# Patient Record
Sex: Male | Born: 1940 | Race: White | Hispanic: No | Marital: Married | Smoking: Former smoker
Health system: Southern US, Community
[De-identification: ages and names within clinical notes are randomized; demographics above are authoritative.]

## PROBLEM LIST (undated history)

## (undated) DIAGNOSIS — E079 Disorder of thyroid, unspecified: Secondary | ICD-10-CM

## (undated) DIAGNOSIS — R0600 Dyspnea, unspecified: Secondary | ICD-10-CM

## (undated) DIAGNOSIS — C801 Malignant (primary) neoplasm, unspecified: Secondary | ICD-10-CM

## (undated) DIAGNOSIS — D229 Melanocytic nevi, unspecified: Secondary | ICD-10-CM

## (undated) DIAGNOSIS — T7840XA Allergy, unspecified, initial encounter: Secondary | ICD-10-CM

## (undated) DIAGNOSIS — E785 Hyperlipidemia, unspecified: Secondary | ICD-10-CM

## (undated) DIAGNOSIS — K219 Gastro-esophageal reflux disease without esophagitis: Secondary | ICD-10-CM

## (undated) DIAGNOSIS — F32A Depression, unspecified: Secondary | ICD-10-CM

## (undated) DIAGNOSIS — J189 Pneumonia, unspecified organism: Secondary | ICD-10-CM

## (undated) DIAGNOSIS — M1990 Unspecified osteoarthritis, unspecified site: Secondary | ICD-10-CM

## (undated) DIAGNOSIS — I10 Essential (primary) hypertension: Secondary | ICD-10-CM

## (undated) DIAGNOSIS — K429 Umbilical hernia without obstruction or gangrene: Secondary | ICD-10-CM

## (undated) DIAGNOSIS — M722 Plantar fascial fibromatosis: Secondary | ICD-10-CM

## (undated) DIAGNOSIS — E039 Hypothyroidism, unspecified: Secondary | ICD-10-CM

## (undated) DIAGNOSIS — F329 Major depressive disorder, single episode, unspecified: Secondary | ICD-10-CM

## (undated) DIAGNOSIS — J449 Chronic obstructive pulmonary disease, unspecified: Secondary | ICD-10-CM

## (undated) HISTORY — DX: Umbilical hernia without obstruction or gangrene: K42.9

## (undated) HISTORY — PX: TONSILLECTOMY: SUR1361

## (undated) HISTORY — DX: Depression, unspecified: F32.A

## (undated) HISTORY — PX: JOINT REPLACEMENT: SHX530

## (undated) HISTORY — DX: Chronic obstructive pulmonary disease, unspecified: J44.9

## (undated) HISTORY — DX: Melanocytic nevi, unspecified: D22.9

## (undated) HISTORY — DX: Plantar fascial fibromatosis: M72.2

## (undated) HISTORY — PX: COLONOSCOPY W/ POLYPECTOMY: SHX1380

## (undated) HISTORY — DX: Allergy, unspecified, initial encounter: T78.40XA

## (undated) HISTORY — DX: Hyperlipidemia, unspecified: E78.5

## (undated) HISTORY — DX: Major depressive disorder, single episode, unspecified: F32.9

## (undated) HISTORY — DX: Essential (primary) hypertension: I10

## (undated) HISTORY — DX: Disorder of thyroid, unspecified: E07.9

## (undated) HISTORY — PX: COLONOSCOPY: SHX174

---

## 2002-11-12 ENCOUNTER — Encounter: Payer: Self-pay | Admitting: Family Medicine

## 2002-11-12 ENCOUNTER — Encounter: Admission: RE | Admit: 2002-11-12 | Discharge: 2002-11-12 | Payer: Self-pay | Admitting: Family Medicine

## 2005-11-25 ENCOUNTER — Ambulatory Visit: Payer: Self-pay | Admitting: Gastroenterology

## 2005-12-09 ENCOUNTER — Ambulatory Visit: Payer: Self-pay | Admitting: Gastroenterology

## 2006-01-07 ENCOUNTER — Ambulatory Visit: Payer: Self-pay

## 2006-09-18 ENCOUNTER — Emergency Department (HOSPITAL_COMMUNITY): Admission: EM | Admit: 2006-09-18 | Discharge: 2006-09-18 | Payer: Self-pay | Admitting: Emergency Medicine

## 2006-10-23 ENCOUNTER — Encounter: Admission: RE | Admit: 2006-10-23 | Discharge: 2006-10-23 | Payer: Self-pay | Admitting: Family Medicine

## 2006-11-09 ENCOUNTER — Emergency Department (HOSPITAL_COMMUNITY): Admission: EM | Admit: 2006-11-09 | Discharge: 2006-11-09 | Payer: Self-pay | Admitting: Emergency Medicine

## 2007-03-02 ENCOUNTER — Encounter: Payer: Self-pay | Admitting: Internal Medicine

## 2007-03-02 ENCOUNTER — Encounter: Admission: RE | Admit: 2007-03-02 | Discharge: 2007-03-02 | Payer: Self-pay | Admitting: Family Medicine

## 2007-04-24 DIAGNOSIS — I10 Essential (primary) hypertension: Secondary | ICD-10-CM | POA: Insufficient documentation

## 2007-04-24 DIAGNOSIS — E039 Hypothyroidism, unspecified: Secondary | ICD-10-CM | POA: Insufficient documentation

## 2007-04-24 DIAGNOSIS — F329 Major depressive disorder, single episode, unspecified: Secondary | ICD-10-CM | POA: Insufficient documentation

## 2007-04-27 ENCOUNTER — Ambulatory Visit: Payer: Self-pay | Admitting: Internal Medicine

## 2007-04-27 DIAGNOSIS — R911 Solitary pulmonary nodule: Secondary | ICD-10-CM | POA: Insufficient documentation

## 2007-04-27 DIAGNOSIS — J984 Other disorders of lung: Secondary | ICD-10-CM | POA: Insufficient documentation

## 2007-04-27 DIAGNOSIS — R0609 Other forms of dyspnea: Secondary | ICD-10-CM | POA: Insufficient documentation

## 2007-04-27 DIAGNOSIS — R0989 Other specified symptoms and signs involving the circulatory and respiratory systems: Secondary | ICD-10-CM

## 2007-06-03 ENCOUNTER — Ambulatory Visit: Payer: Self-pay | Admitting: Internal Medicine

## 2007-06-03 DIAGNOSIS — J449 Chronic obstructive pulmonary disease, unspecified: Secondary | ICD-10-CM | POA: Insufficient documentation

## 2007-07-07 ENCOUNTER — Ambulatory Visit: Payer: Self-pay | Admitting: Internal Medicine

## 2007-08-11 ENCOUNTER — Ambulatory Visit: Payer: Self-pay | Admitting: Internal Medicine

## 2007-08-13 ENCOUNTER — Encounter: Payer: Self-pay | Admitting: Internal Medicine

## 2007-08-19 ENCOUNTER — Telehealth (INDEPENDENT_AMBULATORY_CARE_PROVIDER_SITE_OTHER): Payer: Self-pay | Admitting: *Deleted

## 2007-09-18 ENCOUNTER — Telehealth (INDEPENDENT_AMBULATORY_CARE_PROVIDER_SITE_OTHER): Payer: Self-pay | Admitting: *Deleted

## 2007-10-02 ENCOUNTER — Ambulatory Visit: Payer: Self-pay | Admitting: Internal Medicine

## 2008-01-11 ENCOUNTER — Ambulatory Visit: Payer: Self-pay | Admitting: Internal Medicine

## 2009-08-17 ENCOUNTER — Encounter: Admission: RE | Admit: 2009-08-17 | Discharge: 2009-08-17 | Payer: Self-pay | Admitting: Internal Medicine

## 2010-11-02 LAB — DIFFERENTIAL
Basophils Absolute: 0.1
Basophils Relative: 1
Eosinophils Absolute: 0
Monocytes Relative: 12 — ABNORMAL HIGH
Neutro Abs: 9.9 — ABNORMAL HIGH
Neutrophils Relative %: 80 — ABNORMAL HIGH

## 2010-11-02 LAB — APTT: aPTT: 31

## 2010-11-02 LAB — CBC
MCHC: 33.4
MCV: 87.4
Platelets: 208
RBC: 3.86 — ABNORMAL LOW
RDW: 12.6

## 2010-11-02 LAB — I-STAT 8, (EC8 V) (CONVERTED LAB)
Acid-base deficit: 4 — ABNORMAL HIGH
Bicarbonate: 20.9
TCO2: 22
pCO2, Ven: 35.2 — ABNORMAL LOW
pH, Ven: 7.381 — ABNORMAL HIGH

## 2010-11-02 LAB — PROTIME-INR: INR: 1.2

## 2010-11-02 LAB — POCT I-STAT CREATININE
Creatinine, Ser: 1.1
Operator id: 279831

## 2010-11-02 LAB — SAMPLE TO BLOOD BANK

## 2010-11-12 ENCOUNTER — Encounter (INDEPENDENT_AMBULATORY_CARE_PROVIDER_SITE_OTHER): Payer: Self-pay | Admitting: General Surgery

## 2010-11-14 ENCOUNTER — Encounter (INDEPENDENT_AMBULATORY_CARE_PROVIDER_SITE_OTHER): Payer: Self-pay | Admitting: General Surgery

## 2010-11-14 ENCOUNTER — Ambulatory Visit (INDEPENDENT_AMBULATORY_CARE_PROVIDER_SITE_OTHER): Payer: Medicare Other | Admitting: General Surgery

## 2010-11-14 VITALS — BP 142/90 | HR 68 | Temp 96.4°F | Resp 20 | Ht 69.0 in | Wt 194.1 lb

## 2010-11-14 DIAGNOSIS — K429 Umbilical hernia without obstruction or gangrene: Secondary | ICD-10-CM

## 2010-11-14 DIAGNOSIS — K4090 Unilateral inguinal hernia, without obstruction or gangrene, not specified as recurrent: Secondary | ICD-10-CM

## 2010-11-14 NOTE — Patient Instructions (Signed)
No lifting for six weeks after surgery

## 2010-11-14 NOTE — Progress Notes (Signed)
Subjective:     Patient ID: Brandon Sanford, male   DOB: 06/09/1940, 70 y.o.   MRN: [id number]  HPI The patient presents complaining of umbilical hernia and left inguinal hernia. He does left inguinal hernia sometime ago.  It is mildly uncomfortable. It is getting slightly larger recently. His umbilical hernia has been stable in size. It is somewhat uncomfortable as well. Review of Systems  Constitutional: Negative for fever, chills and unexpected weight change.  HENT: Negative for hearing loss, congestion, sore throat, trouble swallowing and voice change.   Eyes: Negative for visual disturbance.  Respiratory: Negative for cough and wheezing.   Cardiovascular: Negative for chest pain, palpitations and leg swelling.  Gastrointestinal: Negative for nausea, vomiting, abdominal pain, diarrhea, constipation, blood in stool, abdominal distention, anal bleeding and rectal pain.  Genitourinary: Negative for hematuria and difficulty urinating.  Musculoskeletal: Negative for arthralgias.  Skin: Negative for rash and wound.  Neurological: Negative for seizures, syncope, weakness and headaches.  Hematological: Negative for adenopathy. Does not bruise/bleed easily.  Psychiatric/Behavioral: Negative for confusion.  Umbilical hernia and left inguinal hernia as above     Objective:   Physical Exam  Constitutional: He is oriented to person, place, and time. He appears well-developed and well-nourished. No distress.  HENT:  Head: Normocephalic and atraumatic.  Eyes: Conjunctivae and EOM are normal. Pupils are equal, round, and reactive to light. Right eye exhibits no discharge.  Neck: Normal range of motion. Neck supple. No tracheal deviation present. No thyromegaly present.  Cardiovascular: Normal rate, regular rhythm and normal heart sounds.   No murmur heard. Pulmonary/Chest: Effort normal and breath sounds normal. No respiratory distress. He has no wheezes. He has no rales.  Abdominal: Soft. Bowel  sounds are normal. He exhibits no distension. There is no tenderness. There is no rebound and no guarding.  Genitourinary: Penis normal. No penile tenderness.  Neurological: He is alert and oriented to person, place, and time.  Skin: Skin is warm and dry.  Abdominal exam: Additionally a small umbilical hernia is noted. This is easily reducible. There is no discomfort on reduction. There is no overlying skin change. Inguinal exam reveals both testes are descended. There is asymmetric fat distribution with the right side larger than the left in his suprapubic region. There is no right inguinal hernia. He does have an easily reducible left inguinal hernia. It does not extend into the scrotum. It is not significantly uncomfortable on reduction.     Assessment:     Umbilical hernia and left inguinal hernia    Plan:     I have offered repair of umbilical hernia with mesh and repair of left inguinal hernia with mesh as an outpatient procedure. Procedure, risks, and benefits, were discussed with the patient and he is agreeable. I advised him he'll need to avoid heavy lifting for a total of 6 weeks after surgery. 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 He was explored to scheduling in the near future so this can be done over the winter season. The questions were answered.

## 2010-11-20 ENCOUNTER — Encounter (HOSPITAL_BASED_OUTPATIENT_CLINIC_OR_DEPARTMENT_OTHER): Payer: Self-pay | Admitting: *Deleted

## 2010-11-23 ENCOUNTER — Ambulatory Visit
Admission: RE | Admit: 2010-11-23 | Discharge: 2010-11-23 | Disposition: A | Discharge status: Home / Self Care | Payer: Medicare Other | Source: Ambulatory Visit | Attending: General Surgery | Admitting: General Surgery

## 2010-11-23 ENCOUNTER — Inpatient Hospital Stay (HOSPITAL_BASED_OUTPATIENT_CLINIC_OR_DEPARTMENT_OTHER)
Admission: RE | Admit: 2010-11-23 | Discharge: 2010-11-23 | Disposition: A | Discharge status: Home / Self Care | Payer: Medicare Other | Source: Ambulatory Visit

## 2010-11-23 ENCOUNTER — Other Ambulatory Visit (INDEPENDENT_AMBULATORY_CARE_PROVIDER_SITE_OTHER): Payer: Self-pay | Admitting: General Surgery

## 2010-11-23 ENCOUNTER — Other Ambulatory Visit: Payer: Self-pay

## 2010-11-23 DIAGNOSIS — Z01811 Encounter for preprocedural respiratory examination: Secondary | ICD-10-CM

## 2010-11-23 LAB — BASIC METABOLIC PANEL
CO2: 28 mEq/L (ref 19–32)
Calcium: 10.2 mg/dL (ref 8.4–10.5)
Creatinine, Ser: 1.01 mg/dL (ref 0.50–1.35)
Glucose, Bld: 100 mg/dL — ABNORMAL HIGH (ref 70–99)
Sodium: 142 mEq/L (ref 135–145)

## 2010-11-26 ENCOUNTER — Ambulatory Visit (HOSPITAL_BASED_OUTPATIENT_CLINIC_OR_DEPARTMENT_OTHER)
Admission: RE | Admit: 2010-11-26 | Discharge: 2010-11-26 | Disposition: A | Discharge status: Home / Self Care | Payer: Medicare Other | Source: Ambulatory Visit | Attending: General Surgery | Admitting: General Surgery

## 2010-11-26 ENCOUNTER — Encounter (HOSPITAL_BASED_OUTPATIENT_CLINIC_OR_DEPARTMENT_OTHER): Payer: Self-pay | Admitting: Anesthesiology

## 2010-11-26 ENCOUNTER — Encounter (HOSPITAL_BASED_OUTPATIENT_CLINIC_OR_DEPARTMENT_OTHER): Payer: Self-pay | Admitting: General Surgery

## 2010-11-26 ENCOUNTER — Encounter (HOSPITAL_BASED_OUTPATIENT_CLINIC_OR_DEPARTMENT_OTHER)
Admission: RE | Disposition: A | Discharge status: Home / Self Care | Payer: Self-pay | Source: Ambulatory Visit | Attending: General Surgery

## 2010-11-26 ENCOUNTER — Ambulatory Visit (HOSPITAL_BASED_OUTPATIENT_CLINIC_OR_DEPARTMENT_OTHER): Payer: Medicare Other | Admitting: Anesthesiology

## 2010-11-26 DIAGNOSIS — K429 Umbilical hernia without obstruction or gangrene: Secondary | ICD-10-CM | POA: Insufficient documentation

## 2010-11-26 DIAGNOSIS — K4090 Unilateral inguinal hernia, without obstruction or gangrene, not specified as recurrent: Secondary | ICD-10-CM | POA: Insufficient documentation

## 2010-11-26 DIAGNOSIS — J449 Chronic obstructive pulmonary disease, unspecified: Secondary | ICD-10-CM | POA: Insufficient documentation

## 2010-11-26 DIAGNOSIS — J4489 Other specified chronic obstructive pulmonary disease: Secondary | ICD-10-CM | POA: Insufficient documentation

## 2010-11-26 DIAGNOSIS — I10 Essential (primary) hypertension: Secondary | ICD-10-CM | POA: Insufficient documentation

## 2010-11-26 DIAGNOSIS — Z01810 Encounter for preprocedural cardiovascular examination: Secondary | ICD-10-CM | POA: Insufficient documentation

## 2010-11-26 HISTORY — PX: UMBILICAL HERNIA REPAIR: SHX196

## 2010-11-26 HISTORY — PX: INGUINAL HERNIA REPAIR: SHX194

## 2010-11-26 HISTORY — PX: HERNIA REPAIR: SHX51

## 2010-11-26 SURGERY — REPAIR, HERNIA, UMBILICAL, ADULT
Anesthesia: General | Site: Groin | Wound class: Clean

## 2010-11-26 MED ORDER — OXYMETAZOLINE HCL 0.05 % NA SOLN: 2.0000 | Freq: Once | NASAL | Status: DC

## 2010-11-26 MED ORDER — ATROPINE SULFATE 0.4 MG/ML IJ SOLN: 0.4000 mg | Freq: Once | INTRAMUSCULAR | Status: DC | PRN

## 2010-11-26 MED ORDER — OXYCODONE-ACETAMINOPHEN 5-325 MG PO TABS: 2.0000 | ORAL_TABLET | Freq: Four times a day (QID) | ORAL | Status: AC | PRN

## 2010-11-26 MED ORDER — ACETAMINOPHEN 10 MG/ML IV SOLN
1000.0000 mg | Freq: Once | INTRAVENOUS | Status: AC
  Administered 2010-11-26: 1000 mg via INTRAVENOUS

## 2010-11-26 MED ORDER — LACTATED RINGERS IV SOLN: 500.0000 mL | INTRAVENOUS | Status: DC

## 2010-11-26 MED ORDER — MIDAZOLAM HCL 2 MG/2ML IJ SOLN: 1.0000 mg | INTRAMUSCULAR | Status: DC | PRN

## 2010-11-26 MED ORDER — CEFAZOLIN SODIUM 1-5 GM-% IV SOLN: 1.0000 g | INTRAVENOUS | Status: DC

## 2010-11-26 MED ORDER — KETOROLAC TROMETHAMINE 30 MG/ML IJ SOLN: 15.0000 mg | Freq: Once | INTRAMUSCULAR | Status: DC | PRN

## 2010-11-26 MED ORDER — FENTANYL CITRATE 0.05 MG/ML IJ SOLN
INTRAMUSCULAR | Status: DC | PRN
  Administered 2010-11-26: 25 ug via INTRAVENOUS
  Administered 2010-11-26 (×2): 50 ug via INTRAVENOUS

## 2010-11-26 MED ORDER — LIDOCAINE-PRILOCAINE 2.5-2.5 % EX CREA: 1.0000 "application " | TOPICAL_CREAM | Freq: Once | CUTANEOUS | Status: DC

## 2010-11-26 MED ORDER — GLYCOPYRROLATE 0.2 MG/ML IJ SOLN: 0.2000 mg | Freq: Once | INTRAMUSCULAR | Status: DC | PRN

## 2010-11-26 MED ORDER — DROPERIDOL 2.5 MG/ML IJ SOLN
INTRAMUSCULAR | Status: DC | PRN
  Administered 2010-11-26: 0.625 mg via INTRAVENOUS

## 2010-11-26 MED ORDER — IBUPROFEN 200 MG PO TABS: 200.0000 mg | ORAL_TABLET | Freq: Four times a day (QID) | ORAL | Status: DC | PRN

## 2010-11-26 MED ORDER — LACTATED RINGERS IV SOLN: INTRAVENOUS | Status: DC

## 2010-11-26 MED ORDER — LABETALOL HCL 5 MG/ML IV SOLN: 5.0000 mg | INTRAVENOUS | Status: DC | PRN

## 2010-11-26 MED ORDER — MIDAZOLAM HCL 2 MG/2ML IJ SOLN: 0.5000 mg | INTRAMUSCULAR | Status: DC | PRN

## 2010-11-26 MED ORDER — LACTATED RINGERS IV SOLN
INTRAVENOUS | Status: DC
  Administered 2010-11-26: 07:00:00 via INTRAVENOUS

## 2010-11-26 MED ORDER — MORPHINE SULFATE 2 MG/ML IJ SOLN: 0.0500 mg/kg | INTRAMUSCULAR | Status: DC | PRN

## 2010-11-26 MED ORDER — DEXAMETHASONE SODIUM PHOSPHATE 4 MG/ML IJ SOLN
INTRAMUSCULAR | Status: DC | PRN
  Administered 2010-11-26: 10 mg via INTRAVENOUS

## 2010-11-26 MED ORDER — PROPOFOL 10 MG/ML IV EMUL
INTRAVENOUS | Status: DC | PRN
  Administered 2010-11-26: 250 mg via INTRAVENOUS

## 2010-11-26 MED ORDER — ONDANSETRON HCL 4 MG/2ML IJ SOLN
INTRAMUSCULAR | Status: DC | PRN
  Administered 2010-11-26: 4 mg via INTRAVENOUS

## 2010-11-26 MED ORDER — BUPIVACAINE-EPINEPHRINE 0.5% -1:200000 IJ SOLN
INTRAMUSCULAR | Status: DC | PRN
  Administered 2010-11-26: 30 mL

## 2010-11-26 MED ORDER — METOCLOPRAMIDE HCL 5 MG/ML IJ SOLN: 10.0000 mg | Freq: Once | INTRAMUSCULAR | Status: DC | PRN

## 2010-11-26 MED ORDER — EPHEDRINE SULFATE 50 MG/ML IJ SOLN
INTRAMUSCULAR | Status: DC | PRN
  Administered 2010-11-26 (×3): 10 mg via INTRAVENOUS

## 2010-11-26 MED ORDER — FENTANYL CITRATE 0.05 MG/ML IJ SOLN: 25.0000 ug | INTRAMUSCULAR | Status: DC | PRN

## 2010-11-26 MED ORDER — LACTATED RINGERS IV SOLN
INTRAVENOUS | Status: DC | PRN
  Administered 2010-11-26 (×3): via INTRAVENOUS

## 2010-11-26 MED ORDER — MIDAZOLAM HCL 5 MG/5ML IJ SOLN
INTRAMUSCULAR | Status: DC | PRN
  Administered 2010-11-26: 2 mg via INTRAVENOUS

## 2010-11-26 MED ORDER — CEFAZOLIN SODIUM 1-5 GM-% IV SOLN
INTRAVENOUS | Status: DC | PRN
  Administered 2010-11-26: 2 g via INTRAVENOUS

## 2010-11-26 SURGICAL SUPPLY — 49 items
APPLICATOR COTTON TIP 6IN STRL (MISCELLANEOUS) IMPLANT
BLADE HEX COATED 2.75 (ELECTRODE) ×3 IMPLANT
BLADE SURG 10 STRL SS (BLADE) ×3 IMPLANT
BLADE SURG 15 STRL LF DISP TIS (BLADE) ×2 IMPLANT
BLADE SURG 15 STRL SS (BLADE) ×1
BLADE SURG ROTATE 9660 (MISCELLANEOUS) ×3 IMPLANT
CANISTER SUCTION 1200CC (MISCELLANEOUS) ×3 IMPLANT
CHLORAPREP W/TINT 26ML (MISCELLANEOUS) ×3 IMPLANT
CLOTH BEACON ORANGE TIMEOUT ST (SAFETY) ×3 IMPLANT
COVER MAYO STAND STRL (DRAPES) ×3 IMPLANT
COVER TABLE BACK 60X90 (DRAPES) ×3 IMPLANT
DECANTER SPIKE VIAL GLASS SM (MISCELLANEOUS) ×3 IMPLANT
DERMABOND ADVANCED (GAUZE/BANDAGES/DRESSINGS) ×2
DERMABOND ADVANCED .7 DNX12 (GAUZE/BANDAGES/DRESSINGS) ×4 IMPLANT
DRAIN PENROSE 1/2X12 LTX STRL (WOUND CARE) ×3 IMPLANT
DRAPE PED LAPAROTOMY (DRAPES) ×3 IMPLANT
DRAPE UTILITY XL STRL (DRAPES) ×3 IMPLANT
ELECT REM PT RETURN 9FT ADLT (ELECTROSURGICAL) ×3
ELECTRODE REM PT RTRN 9FT ADLT (ELECTROSURGICAL) ×2 IMPLANT
GLOVE BIO SURGEON STRL SZ8 (GLOVE) ×3 IMPLANT
GLOVE BIOGEL PI IND STRL 8 (GLOVE) ×2 IMPLANT
GLOVE BIOGEL PI INDICATOR 8 (GLOVE) ×1
GLOVE ECLIPSE 6.5 STRL STRAW (GLOVE) ×3 IMPLANT
GOWN PREVENTION PLUS XLARGE (GOWN DISPOSABLE) ×3 IMPLANT
GOWN PREVENTION PLUS XXLARGE (GOWN DISPOSABLE) ×3 IMPLANT
MESH HERNIA 3X6 (Mesh General) ×3 IMPLANT
NEEDLE HYPO 25X1 1.5 SAFETY (NEEDLE) ×3 IMPLANT
NS IRRIG 1000ML POUR BTL (IV SOLUTION) ×3 IMPLANT
PACK BASIN DAY SURGERY FS (CUSTOM PROCEDURE TRAY) ×3 IMPLANT
PATCH VENTRAL SMALL 4.3 (Mesh Specialty) ×3 IMPLANT
PENCIL BUTTON HOLSTER BLD 10FT (ELECTRODE) ×3 IMPLANT
SLEEVE SCD COMPRESS KNEE MED (MISCELLANEOUS) IMPLANT
SPONGE LAP 18X18 X RAY DECT (DISPOSABLE) IMPLANT
SPONGE LAP 4X18 X RAY DECT (DISPOSABLE) ×3 IMPLANT
SUT MON AB 4-0 PC3 18 (SUTURE) ×6 IMPLANT
SUT PROLENE 0 CT 2 (SUTURE) ×18 IMPLANT
SUT VIC AB 2-0 SH 27 (SUTURE) ×3
SUT VIC AB 2-0 SH 27XBRD (SUTURE) ×6 IMPLANT
SUT VIC AB 3-0 SH 27 (SUTURE) ×1
SUT VIC AB 3-0 SH 27X BRD (SUTURE) ×2 IMPLANT
SUT VICRYL AB 2 0 TIE (SUTURE) IMPLANT
SUT VICRYL AB 2 0 TIES (SUTURE)
SYR CONTROL 10ML LL (SYRINGE) ×3 IMPLANT
TAPE STRIPS DRAPE STRL (GAUZE/BANDAGES/DRESSINGS) ×3 IMPLANT
TOWEL OR 17X24 6PK STRL BLUE (TOWEL DISPOSABLE) ×6 IMPLANT
TOWEL OR NON WOVEN STRL DISP B (DISPOSABLE) ×3 IMPLANT
TUBE CONNECTING 20X1/4 (TUBING) ×3 IMPLANT
WATER STERILE IRR 1000ML POUR (IV SOLUTION) ×3 IMPLANT
YANKAUER SUCT BULB TIP NO VENT (SUCTIONS) ×3 IMPLANT

## 2010-11-26 NOTE — Transfer of Care (Signed)
Immediate Anesthesia Transfer of Care Note  Patient: Brandon Sanford  Procedure(s) Performed:  HERNIA REPAIR UMBILICAL ADULT - umbilical hernia repair with mesh; HERNIA REPAIR INGUINAL ADULT - left inguinal hernia repair with mesh  Patient Location: PACU  Anesthesia Type: General  Level of Consciousness: awake and sedated  Airway & Oxygen Therapy: Patient Spontanous Breathing and Patient connected to face mask oxygen  Post-op Assessment: Report given to PACU RN and Post -op Vital signs reviewed and stable  Post vital signs: Reviewed and stable  Complications: No apparent anesthesia complications

## 2010-11-26 NOTE — Anesthesia Preprocedure Evaluation (Addendum)
Anesthesia Evaluation  Patient identified by MRN, date of birth, ID band Patient awake    Reviewed: Allergy & Precautions, H&P , NPO status , Patient's Chart, lab work & pertinent test results, reviewed documented beta blocker date and time   Airway Mallampati: II TM Distance: >3 FB Neck ROM: full    Dental No notable dental hx.    Pulmonary asthma , COPD COPD inhaler,    Pulmonary exam normal       Cardiovascular hypertension, On Medications     Neuro/Psych PSYCHIATRIC DISORDERS Negative Neurological ROS     GI/Hepatic negative GI ROS, Neg liver ROS,   Endo/Other  Negative Endocrine ROS  Renal/GU negative Renal ROS  Genitourinary negative   Musculoskeletal   Abdominal   Peds  Hematology negative hematology ROS (+)   Anesthesia Other Findings   Reproductive/Obstetrics negative OB ROS                           Anesthesia Physical Anesthesia Plan  ASA: III  Anesthesia Plan: General   Post-op Pain Management:    Induction: Intravenous  Airway Management Planned: LMA  Additional Equipment:   Intra-op Plan:   Post-operative Plan: Extubation in OR  Informed Consent: I have reviewed the patients History and Physical, chart, labs and discussed the procedure including the risks, benefits and alternatives for the proposed anesthesia with the patient or authorized representative who has indicated his/her understanding and acceptance.     Plan Discussed with: CRNA and Surgeon  Anesthesia Plan Comments:        Anesthesia Quick Evaluation

## 2010-11-26 NOTE — Anesthesia Procedure Notes (Signed)
Procedure Name: LMA Insertion Date/Time: 11/26/2010 7:31 AM Performed by: Zenia Resides D Patient Re-evaluated:Patient Re-evaluated prior to inductionOxygen Delivery Method: Circle System Utilized Preoxygenation: Pre-oxygenation with 100% oxygen Intubation Type: IV induction Ventilation: Mask ventilation without difficulty LMA: LMA with gastric port inserted LMA Size: 5.0 Placement Confirmation: positive ETCO2,  CO2 detector and breath sounds checked- equal and bilateral Tube secured with: Tape Dental Injury: Teeth and Oropharynx as per pre-operative assessment

## 2010-11-26 NOTE — H&P (Signed)
Patient re-examined.  See H&P.  No significant changes since that exam.

## 2010-11-26 NOTE — Brief Op Note (Signed)
11/26/2010  9:09 AM  PATIENT:  Brandon Sanford  70 y.o. male  PRE-OPERATIVE DIAGNOSIS:  umbilical and  left inguinal hernia   POST-OPERATIVE DIAGNOSIS:  umbilical and left inguinal hernia  PROCEDURE:  Procedure(s): HERNIA REPAIR UMBILICAL ADULT HERNIA REPAIR INGUINAL ADULT  SURGEON:  Surgeon(s): Liz Malady, MDnone  PHYSICIAN ASSISTANT:   ASSISTANTS:    ANESTHESIA:   general  EBL:  Total I/O In: 2100 [I.V.:2100] Out: -   BLOOD ADMINISTERED:none  DRAINS: none   LOCAL MEDICATIONS USED:  MARCAINE 30CC  SPECIMEN:  No Specimen  DISPOSITION OF SPECIMEN:  N/A  COUNTS:  YES  TOURNIQUET:  * No tourniquets in log *  DICTATION: .Dragon Dictation  PLAN OF CARE: Discharge to home after PACU  PATIENT DISPOSITION:  PACU - hemodynamically stable.   Delay start of Pharmacological VTE agent (>24hrs) due to surgical blood loss or risk of bleeding:  no   Patient was identified in the preop holding area. Consent was obtained.  he received intravenous antibiotics. Brought to the operating room and general anesthesia with laryngeal mask airway was administered by the anesthesia staff. His abdomen was prepped and draped in sterile fashion. Attention was first directed to the umbilical hernia. Time out procedure. The area below the umbilicus was infiltrated with half percent Marcaine with epinephrine. An umbilical incision was made. 16 his tissues were dissected down revealing the anterior fascia. The umbilicus was circumferentially dissected. The umbilical skin was carefully dissected away from the under umbilical tissue. There was no damage to the umbilical skin. The hernia sac was entered. It contained only omentum. This was circumferentially dissected away from the fascia. The sac was dissected off of the omentum in the fascia and discarded. The omentum was reduced completely back into the abdomen. The fascia was cleared off circumferentially. Next a 4.3 cm proceed hernia patch was  used to complete the repair. The superior flap was tacked to the fascia with interrupted 0 Prolene sutures. The inferior flap was sutured to the fascia with interrupted 0 Prolene sutures as well. Additional interrupted 0 point sutures were placed medially and laterally. This completed attachment of the mesh to the fascia. The area was copiously irrigated. The underneath of the umbilical skin was tacked onto the underlying fascia with interrupted 2-0 Vicryl sutures. Subcutaneous tissues were closed with interrupted 3-0 Vicryl sutures. The skin was closed with running 4-0 Monocryl subcuticular stitch and Dermabond. All counts were correct.  Next attention was directed to the left inguinal region. Percent Marcaine with epinephrine was injected towards the anterior superior iliac spine and along the planned line of incision. A single incision was made. Subcutaneous tissues were dissected down to Scarpa's fascia. Excellent hemostasis was obtained with Bovie cautery. The external oblique fascia was exposed this was divided out laterally in the division was continued down through the external ring. The superior leaflet of the external oblique fascia was dissected free off the transversalis. The inferior leaflet was dissected down revealing the shelving edge of inguinal ligament. Cord structures were encircled with a Penrose drain. Inspection of the floor revealed an intact floor. The cord was then dissected. This revealed a moderately large indirect hernia sac. This contained omentum. It was completely dissected free from the cord structures. It had quite a tight neck so the omentum it contained was divided and the sac was high ligated with 2-0 Vicryl suture. The sac and small piece of omentum were discarded. Cord was further inspected and no other abnormalities were noted. The  hernia repair was completed with a keyhole polypropylene mesh cut to custom size. This was tacked to the tissues over the pubic tubercle medially  with 0 Prolene sutures. It was sutured in a continuous fashion to the shelving edge of the inguinal ligament inferiorly with 0 Prolene sutures. The superior portion of the mesh was sutured to the tissues again over the pubic tubercle with interrupted 0 Prolene sutures. It was then tacked down along the transversalis with interrupted 0 Prolene sutures. The 2 leaflets of the mesh were rejoined behind the cord structures and reflected. They were tacked together and down to the underlying musculature. This was using 0 Prolene as well. The area was grossly irrigated. Some additional local anesthetic was injected. The external oblique fascia was then closed with suturing with 2-0 Vicryl sutures. Subcutaneous tissues were irrigated. Scarpa's fascia was approximated with inverted 3-0 Vicryl sutures. The skin was closed with running 4-0 Monocryl followed by Dermabond. Sponge needle and instrument counts were again correct. Patient's left testicle was relocated to the anatomic position in the scrotum at the completion of the procedure. There were no apparent palpitations. The patient was taken recovery in stable condition.               Annison Birchard E 11/5/20129:09 AM

## 2010-11-26 NOTE — Anesthesia Postprocedure Evaluation (Signed)
  Anesthesia Post-op Note  Patient: Brandon Sanford  Procedure(s) Performed:  HERNIA REPAIR UMBILICAL ADULT - umbilical hernia repair with mesh; HERNIA REPAIR INGUINAL ADULT - left inguinal hernia repair with mesh  Patient Location: PACU  Anesthesia Type: General  Level of Consciousness: awake  Airway and Oxygen Therapy: Patient Spontanous Breathing  Post-op Pain: mild  Post-op Assessment: Post-op Vital signs reviewed, Patient's Cardiovascular Status Stable, Respiratory Function Stable, Patent Airway, No signs of Nausea or vomiting, Adequate PO intake and Pain level controlled  Post-op Vital Signs: Reviewed and stable  Complications: No apparent anesthesia complications

## 2010-12-06 ENCOUNTER — Encounter (INDEPENDENT_AMBULATORY_CARE_PROVIDER_SITE_OTHER): Payer: Self-pay

## 2010-12-11 ENCOUNTER — Encounter (HOSPITAL_BASED_OUTPATIENT_CLINIC_OR_DEPARTMENT_OTHER): Payer: Self-pay | Admitting: General Surgery

## 2010-12-19 ENCOUNTER — Ambulatory Visit (INDEPENDENT_AMBULATORY_CARE_PROVIDER_SITE_OTHER): Payer: Medicare Other | Admitting: General Surgery

## 2010-12-19 ENCOUNTER — Encounter (INDEPENDENT_AMBULATORY_CARE_PROVIDER_SITE_OTHER): Payer: Self-pay | Admitting: General Surgery

## 2010-12-19 VITALS — BP 132/90 | HR 60 | Temp 96.7°F | Resp 16 | Ht 69.0 in | Wt 194.1 lb

## 2010-12-19 DIAGNOSIS — Z8719 Personal history of other diseases of the digestive system: Secondary | ICD-10-CM

## 2010-12-19 DIAGNOSIS — Z9889 Other specified postprocedural states: Secondary | ICD-10-CM

## 2010-12-19 DIAGNOSIS — Z09 Encounter for follow-up examination after completed treatment for conditions other than malignant neoplasm: Secondary | ICD-10-CM

## 2010-12-19 NOTE — Progress Notes (Signed)
Subjective:     Patient ID: Brandon Sanford, male   DOB: 12-14-1940, 70 y.o.   MRN: [id number]  HPI Patient status post umbilical hernia repair with mesh and left inguinal hernia repair with mesh on November 14, 2010. Postoperatively he is doing very well. He is no longer taking any pain medication. He did have some constipation but that initially. He is having some mild tenderness in his left testicle.  Review of Systems     Objective:   Physical Exam Umbilical incision is well healed. His hernia repairs intact. There is no evidence of infection. Left inguinal incision is healing well. There's no signs of infection. His hernia appears intact. He is very mild edema of his left testicle. There is no significant ecchymosis.    Assessment:     Very well after her umbilical hernia and left inguinal hernia repair both with mesh    Plan:     Avoid heavy lifting for a total of 6 weeks after surgery. I feel his testicular congestion will resolve over the next month. I will see him back as needed.

## 2010-12-19 NOTE — Patient Instructions (Signed)
No lifting over 10lb for total of 6 weeks after surgery

## 2012-02-12 ENCOUNTER — Encounter (INDEPENDENT_AMBULATORY_CARE_PROVIDER_SITE_OTHER): Payer: Self-pay | Admitting: General Surgery

## 2012-02-12 ENCOUNTER — Ambulatory Visit (INDEPENDENT_AMBULATORY_CARE_PROVIDER_SITE_OTHER): Payer: Medicare Other | Admitting: General Surgery

## 2012-02-12 VITALS — BP 136/84 | HR 72 | Temp 97.8°F | Resp 16 | Ht 69.0 in | Wt 194.0 lb

## 2012-02-12 DIAGNOSIS — R1033 Periumbilical pain: Secondary | ICD-10-CM

## 2012-02-12 NOTE — Patient Instructions (Addendum)
Call if any side effects from medication as we discussed

## 2012-02-12 NOTE — Progress Notes (Signed)
Subjective:     Patient ID: Brandon Sanford, male   DOB: November 14, 1940, 72 y.o.   MRN: [id number]  HPI Patient status post umbilical hernia repair and left inguinal hernia repair with mesh in 2012. For the past several months, he has had some pain around his umbilical site. He's had no skin changes. He has had no bulging. The pain is sharp at times and dull at others. He has had no change in his dietary habits. No inguinal discomfort.  Review of Systems     Objective:   Physical Exam  Constitutional: He is oriented to person, place, and time. He appears well-developed and well-nourished.  Cardiovascular: Normal rate and regular rhythm.   Pulmonary/Chest: No respiratory distress.  Abdominal: Soft. He exhibits no distension and no mass. There is no tenderness.       Umbilical incision remains well-healed. No evidence of recurrence of hernia. No significant point tenderness, left inguinal incision well healed with no evidence of recurrence. Both testes are descended. No right inguinal hernia is felt.  Neurological: He is alert and oriented to person, place, and time.  Skin: Skin is warm and dry.       Assessment:     Umbilical pain with history of umbilical hernia repair with mesh, pain likely due to nerve irritation from scar tissue    Plan:     Lyrica 75 mg twice a day. I will see him back in 6 weeks. I discussed the possible side effects and to let us know or discontinue if any of these arise.

## 2012-12-11 ENCOUNTER — Emergency Department (HOSPITAL_COMMUNITY)
Admission: EM | Admit: 2012-12-11 | Discharge: 2012-12-11 | Discharge status: Absent without leave | Payer: Medicare Other | Source: Home / Self Care

## 2013-02-08 ENCOUNTER — Ambulatory Visit: Payer: Medicare HMO

## 2013-10-01 ENCOUNTER — Telehealth: Payer: Self-pay | Admitting: Internal Medicine

## 2013-10-01 NOTE — Telephone Encounter (Signed)
Spoke with the pt  He is requesting cxr discs for his PCP  I advised to contact our medical records dept for this  He verbalized understanding  Nothing further needed

## 2013-11-29 ENCOUNTER — Inpatient Hospital Stay
Admission: RE | Admit: 2013-11-29 | Discharge: 2013-11-29 | Disposition: A | Discharge status: Home / Self Care | Payer: Self-pay | Source: Ambulatory Visit | Attending: Family Medicine | Admitting: Family Medicine

## 2013-11-29 ENCOUNTER — Other Ambulatory Visit (HOSPITAL_COMMUNITY): Payer: Self-pay | Admitting: Diagnostic Radiology

## 2013-11-29 ENCOUNTER — Other Ambulatory Visit: Payer: Self-pay | Admitting: Family Medicine

## 2013-11-29 ENCOUNTER — Ambulatory Visit
Admission: RE | Admit: 2013-11-29 | Discharge: 2013-11-29 | Disposition: A | Discharge status: Home / Self Care | Payer: Medicare Other | Source: Ambulatory Visit | Attending: Family Medicine | Admitting: Family Medicine

## 2013-11-29 DIAGNOSIS — R911 Solitary pulmonary nodule: Secondary | ICD-10-CM

## 2014-02-01 DIAGNOSIS — J069 Acute upper respiratory infection, unspecified: Secondary | ICD-10-CM | POA: Diagnosis not present

## 2014-05-02 DIAGNOSIS — E039 Hypothyroidism, unspecified: Secondary | ICD-10-CM | POA: Diagnosis not present

## 2014-05-02 DIAGNOSIS — J449 Chronic obstructive pulmonary disease, unspecified: Secondary | ICD-10-CM | POA: Diagnosis not present

## 2014-05-02 DIAGNOSIS — I10 Essential (primary) hypertension: Secondary | ICD-10-CM | POA: Diagnosis not present

## 2014-05-02 DIAGNOSIS — E780 Pure hypercholesterolemia: Secondary | ICD-10-CM | POA: Diagnosis not present

## 2014-11-02 DIAGNOSIS — I10 Essential (primary) hypertension: Secondary | ICD-10-CM | POA: Diagnosis not present

## 2014-11-02 DIAGNOSIS — J449 Chronic obstructive pulmonary disease, unspecified: Secondary | ICD-10-CM | POA: Diagnosis not present

## 2014-11-02 DIAGNOSIS — E039 Hypothyroidism, unspecified: Secondary | ICD-10-CM | POA: Diagnosis not present

## 2014-11-02 DIAGNOSIS — E7800 Pure hypercholesterolemia, unspecified: Secondary | ICD-10-CM | POA: Diagnosis not present

## 2015-04-17 DIAGNOSIS — K5900 Constipation, unspecified: Secondary | ICD-10-CM | POA: Diagnosis not present

## 2015-04-17 DIAGNOSIS — J449 Chronic obstructive pulmonary disease, unspecified: Secondary | ICD-10-CM | POA: Diagnosis not present

## 2015-04-17 DIAGNOSIS — I10 Essential (primary) hypertension: Secondary | ICD-10-CM | POA: Diagnosis not present

## 2015-04-17 DIAGNOSIS — E039 Hypothyroidism, unspecified: Secondary | ICD-10-CM | POA: Diagnosis not present

## 2015-04-17 DIAGNOSIS — H2513 Age-related nuclear cataract, bilateral: Secondary | ICD-10-CM | POA: Diagnosis not present

## 2015-04-17 DIAGNOSIS — H43391 Other vitreous opacities, right eye: Secondary | ICD-10-CM | POA: Diagnosis not present

## 2015-04-17 DIAGNOSIS — Z1211 Encounter for screening for malignant neoplasm of colon: Secondary | ICD-10-CM | POA: Diagnosis not present

## 2015-04-17 DIAGNOSIS — H524 Presbyopia: Secondary | ICD-10-CM | POA: Diagnosis not present

## 2015-04-17 DIAGNOSIS — H04123 Dry eye syndrome of bilateral lacrimal glands: Secondary | ICD-10-CM | POA: Diagnosis not present

## 2015-04-17 DIAGNOSIS — Z0000 Encounter for general adult medical examination without abnormal findings: Secondary | ICD-10-CM | POA: Diagnosis not present

## 2015-04-17 DIAGNOSIS — E7800 Pure hypercholesterolemia, unspecified: Secondary | ICD-10-CM | POA: Diagnosis not present

## 2015-04-17 DIAGNOSIS — H52223 Regular astigmatism, bilateral: Secondary | ICD-10-CM | POA: Diagnosis not present

## 2015-10-02 ENCOUNTER — Encounter: Payer: Self-pay | Admitting: Gastroenterology

## 2015-12-28 ENCOUNTER — Encounter: Payer: Self-pay | Admitting: Gastroenterology

## 2016-02-09 ENCOUNTER — Ambulatory Visit: Payer: Medicare HMO | Admitting: *Deleted

## 2016-02-09 VITALS — Ht 69.0 in | Wt 191.0 lb

## 2016-02-09 DIAGNOSIS — Z1211 Encounter for screening for malignant neoplasm of colon: Secondary | ICD-10-CM

## 2016-02-09 MED ORDER — NA SULFATE-K SULFATE-MG SULF 17.5-3.13-1.6 GM/177ML PO SOLN: 1.0000 | Freq: Once | ORAL | 0 refills | Status: AC

## 2016-02-09 NOTE — Progress Notes (Signed)
Denies allergies to eggs or soy products. Denies complications with sedation or anesthesia. Denies O2 use. Denies use of diet or weight loss medications.  Emmi instructions given for colonoscopy.  

## 2016-02-22 ENCOUNTER — Encounter: Payer: Self-pay | Admitting: Gastroenterology

## 2016-02-23 ENCOUNTER — Ambulatory Visit (AMBULATORY_SURGERY_CENTER): Payer: Medicare HMO | Admitting: Gastroenterology

## 2016-02-23 ENCOUNTER — Encounter: Payer: Self-pay | Admitting: Gastroenterology

## 2016-02-23 VITALS — BP 130/76 | HR 64 | Temp 96.6°F | Resp 16 | Ht 69.0 in | Wt 191.0 lb

## 2016-02-23 DIAGNOSIS — D122 Benign neoplasm of ascending colon: Secondary | ICD-10-CM

## 2016-02-23 DIAGNOSIS — D120 Benign neoplasm of cecum: Secondary | ICD-10-CM

## 2016-02-23 DIAGNOSIS — J449 Chronic obstructive pulmonary disease, unspecified: Secondary | ICD-10-CM | POA: Diagnosis not present

## 2016-02-23 DIAGNOSIS — Z1211 Encounter for screening for malignant neoplasm of colon: Secondary | ICD-10-CM

## 2016-02-23 DIAGNOSIS — I10 Essential (primary) hypertension: Secondary | ICD-10-CM | POA: Diagnosis not present

## 2016-02-23 DIAGNOSIS — Z1212 Encounter for screening for malignant neoplasm of rectum: Secondary | ICD-10-CM

## 2016-02-23 DIAGNOSIS — E039 Hypothyroidism, unspecified: Secondary | ICD-10-CM | POA: Diagnosis not present

## 2016-02-23 MED ORDER — SODIUM CHLORIDE 0.9 % IV SOLN: 500.0000 mL | INTRAVENOUS | Status: DC

## 2016-02-23 NOTE — Op Note (Signed)
[facility] Patient Name: Brandon Sanford Procedure Date: 02/23/2016 8:30 AM MRN: [id number] Endoscopist: Mallie Mussel L. Loletha Carrow , MD Age: 76 Referring MD:  Date of Birth: 08-20-1940 Gender: Male Account #: [id number] Procedure:                Colonoscopy Indications:              Screening for colorectal malignant neoplasm (no                            polyps on 11/2005 colonoscopy) Medicines:                Monitored Anesthesia Care Procedure:                Pre-Anesthesia Assessment:                           - Prior to the procedure, a History and Physical                            was performed, and patient medications and                            allergies were reviewed. The patient's tolerance of                            previous anesthesia was also reviewed. The risks                            and benefits of the procedure and the sedation                            options and risks were discussed with the patient.                            All questions were answered, and informed consent                            was obtained. Anticoagulants: The patient has taken                            aspirin. It was decided not to withhold this                            medication prior to the procedure. ASA Grade                            Assessment: II - A patient with mild systemic                            disease. After reviewing the risks and benefits,                            the patient was deemed in satisfactory condition to  undergo the procedure.                           After obtaining informed consent, the colonoscope                            was passed under direct vision. Throughout the                            procedure, the patient's blood pressure, pulse, and                            oxygen saturations were monitored continuously. The                            Model CF-HQ190L [id number]) scope was introduced                  through the anus and advanced to the the cecum,                            identified by appendiceal orifice and ileocecal                            valve. The colonoscopy was performed without                            difficulty. The patient tolerated the procedure                            well. The quality of the bowel preparation was fair                            (after extensive lavage). The ileocecal valve,                            appendiceal orifice, and rectum were photographed.                            The quality of the bowel preparation was evaluated                            using the BBPS [facility] Bowel Preparation Scale)                            with scores of: Right Colon = 2, Transverse Colon =                            2 and Left Colon = 1. The total BBPS score equals                            5. The bowel preparation used was SUPREP. Scope In: 8:39:42 AM Scope Out: 9:00:44 AM Scope Withdrawal Time: 0 hours 13 minutes 38 seconds  Total Procedure Duration: 0 hours 21 minutes 2  seconds  Findings:                 The perianal and digital rectal examinations were                            normal.                           Two sessile polyps were found in the distal                            ascending colon and cecum. The polyps were 4 mm in                            size. These polyps were removed with a cold snare.                            Resection and retrieval were complete.                           Multiple small-mouthed diverticula were found in                            the left colon.                           The exam was otherwise without abnormality on                            direct and retroflexion views. Complications:            No immediate complications. Estimated Blood Loss:     Estimated blood loss: none. Impression:               - Preparation of the colon was fair.                           - Two 4 mm polyps in the  distal ascending colon and                            in the cecum, removed with a cold snare. Resected                            and retrieved.                           - Diverticulosis in the left colon.                           - The examination was otherwise normal on direct                            and retroflexion views. Recommendation:           - Patient has a contact number available for  emergencies. The signs and symptoms of potential                            delayed complications were discussed with the                            patient. Return to normal activities tomorrow.                            Written discharge instructions were provided to the                            patient.                           - Resume previous diet.                           - Continue present medications.                           - Await pathology results.                           - Repeat colonoscopy is recommended for                            surveillance. The colonoscopy date will be                            determined after pathology results from today's                            exam become available for review. If either polyp                            adenomatous, repeat colonoscopy in 3 years due to                            suboptimal prep. [name], MD 02/23/2016 9:06:10 AM This report has been signed electronically.

## 2016-02-23 NOTE — Progress Notes (Signed)
Called to room to assist during endoscopic procedure.  Patient ID and intended procedure confirmed with present staff. Received instructions for my participation in the procedure from the performing physician.  

## 2016-02-23 NOTE — Progress Notes (Signed)
To recovery, report to Hodges, RN, VSS 

## 2016-02-23 NOTE — Patient Instructions (Signed)
YOU HAD AN ENDOSCOPIC PROCEDURE TODAY AT [facility]:   Refer to the procedure report that was given to you for any specific questions about what was found during the examination.  If the procedure report does not answer your questions, please call your gastroenterologist to clarify.  If you requested that your care partner not be given the details of your procedure findings, then the procedure report has been included in a sealed envelope for you to review at your convenience later.  YOU SHOULD EXPECT: Some feelings of bloating in the abdomen. Passage of more gas than usual.  Walking can help get rid of the air that was put into your GI tract during the procedure and reduce the bloating. If you had a lower endoscopy (such as a colonoscopy or flexible sigmoidoscopy) you may notice spotting of blood in your stool or on the toilet paper. If you underwent a bowel prep for your procedure, you may not have a normal bowel movement for a few days.  Please Note:  You might notice some irritation and congestion in your nose or some drainage.  This is from the oxygen used during your procedure.  There is no need for concern and it should clear up in a day or so.  SYMPTOMS TO REPORT IMMEDIATELY:   Following lower endoscopy (colonoscopy or flexible sigmoidoscopy):  Excessive amounts of blood in the stool  Significant tenderness or worsening of abdominal pains  Swelling of the abdomen that is new, acute  Fever of 100F or higher   For urgent or emergent issues, a gastroenterologist can be reached at any hour by calling [phone].   DIET:  We do recommend a small meal at first, but then you may proceed to your regular diet.  Drink plenty of fluids but you should avoid alcoholic beverages for 24 hours. Try to increase the fiber in your diet, and drink plenty of water.  ACTIVITY:  You should plan to take it easy for the rest of today and you should NOT DRIVE or use heavy machinery until  tomorrow (because of the sedation medicines used during the test).    FOLLOW UP: Our staff will call the number listed on your records the next business day following your procedure to check on you and address any questions or concerns that you may have regarding the information given to you following your procedure. If we do not reach you, we will leave a message.  However, if you are feeling well and you are not experiencing any problems, there is no need to return our call.  We will assume that you have returned to your regular daily activities without incident.  If any biopsies were taken you will be contacted by phone or by letter within the next 1-3 weeks.  Please call us at [phone] if you have not heard about the biopsies in 3 weeks.    SIGNATURES/CONFIDENTIALITY: You and/or your care partner have signed paperwork which will be entered into your electronic medical record.  These signatures attest to the fact that that the information above on your After Visit Summary has been reviewed and is understood.  Full responsibility of the confidentiality of this discharge information lies with you and/or your care-partner.  You will probably need another colonoscopy in 3 years due to the poor prep.  Thank-you for choosing [place] for your healthcare needs today.

## 2016-02-26 ENCOUNTER — Telehealth: Payer: Self-pay

## 2016-02-26 NOTE — Telephone Encounter (Signed)
  Follow up Call-  Call back number 02/23/2016  Post procedure Call Back phone  # [phone]  Permission to leave phone message Yes  Some recent data might be hidden    Patient was called for follow up after his procedure one 02/23/2016. No answer at the number given for follow up phone call. A message was left on the answering machine.

## 2016-02-26 NOTE — Telephone Encounter (Signed)
  Follow up Call-  Call back number 02/23/2016  Post procedure Call Back phone  # [phone]  Permission to leave phone message Yes  Some recent data might be hidden     Patient questions:  Do you have a fever, pain , or abdominal swelling? No. Pain Score  0 *  Have you tolerated food without any problems? Yes.    Have you been able to return to your normal activities? Yes.    Do you have any questions about your discharge instructions: Diet   Yes.   Medications  Yes.   Follow up visit  Yes.    Do you have questions or concerns about your Care? No.  Actions: * If pain score is 4 or above: No action needed, pain <4.

## 2016-02-28 ENCOUNTER — Encounter: Payer: Self-pay | Admitting: Gastroenterology

## 2016-04-22 DIAGNOSIS — H2513 Age-related nuclear cataract, bilateral: Secondary | ICD-10-CM | POA: Diagnosis not present

## 2016-04-22 DIAGNOSIS — H43391 Other vitreous opacities, right eye: Secondary | ICD-10-CM | POA: Diagnosis not present

## 2016-04-22 DIAGNOSIS — H04123 Dry eye syndrome of bilateral lacrimal glands: Secondary | ICD-10-CM | POA: Diagnosis not present

## 2016-04-22 DIAGNOSIS — H52223 Regular astigmatism, bilateral: Secondary | ICD-10-CM | POA: Diagnosis not present

## 2016-04-22 DIAGNOSIS — H524 Presbyopia: Secondary | ICD-10-CM | POA: Diagnosis not present

## 2016-05-06 DIAGNOSIS — J449 Chronic obstructive pulmonary disease, unspecified: Secondary | ICD-10-CM | POA: Diagnosis not present

## 2016-05-06 DIAGNOSIS — Z0000 Encounter for general adult medical examination without abnormal findings: Secondary | ICD-10-CM | POA: Diagnosis not present

## 2016-05-06 DIAGNOSIS — E7800 Pure hypercholesterolemia, unspecified: Secondary | ICD-10-CM | POA: Diagnosis not present

## 2016-05-06 DIAGNOSIS — E039 Hypothyroidism, unspecified: Secondary | ICD-10-CM | POA: Diagnosis not present

## 2016-05-06 DIAGNOSIS — I10 Essential (primary) hypertension: Secondary | ICD-10-CM | POA: Diagnosis not present

## 2016-07-08 DIAGNOSIS — H4312 Vitreous hemorrhage, left eye: Secondary | ICD-10-CM | POA: Diagnosis not present

## 2016-07-09 DIAGNOSIS — H33312 Horseshoe tear of retina without detachment, left eye: Secondary | ICD-10-CM | POA: Diagnosis not present

## 2016-07-09 DIAGNOSIS — H43813 Vitreous degeneration, bilateral: Secondary | ICD-10-CM | POA: Diagnosis not present

## 2016-07-09 DIAGNOSIS — H4312 Vitreous hemorrhage, left eye: Secondary | ICD-10-CM | POA: Diagnosis not present

## 2016-07-31 DIAGNOSIS — H33312 Horseshoe tear of retina without detachment, left eye: Secondary | ICD-10-CM | POA: Diagnosis not present

## 2016-08-15 DIAGNOSIS — M25552 Pain in left hip: Secondary | ICD-10-CM | POA: Diagnosis not present

## 2016-09-16 ENCOUNTER — Ambulatory Visit (INDEPENDENT_AMBULATORY_CARE_PROVIDER_SITE_OTHER): Payer: Medicare HMO | Admitting: Orthopaedic Surgery

## 2016-09-16 ENCOUNTER — Encounter (INDEPENDENT_AMBULATORY_CARE_PROVIDER_SITE_OTHER): Payer: Self-pay | Admitting: Orthopaedic Surgery

## 2016-09-16 ENCOUNTER — Ambulatory Visit (INDEPENDENT_AMBULATORY_CARE_PROVIDER_SITE_OTHER): Payer: Medicare HMO

## 2016-09-16 DIAGNOSIS — M1612 Unilateral primary osteoarthritis, left hip: Secondary | ICD-10-CM | POA: Diagnosis not present

## 2016-09-16 DIAGNOSIS — M25552 Pain in left hip: Secondary | ICD-10-CM | POA: Diagnosis not present

## 2016-09-16 NOTE — Addendum Note (Signed)
Addended byLaurann Montana on: 09/16/2016 10:06 AM   Modules accepted: Orders

## 2016-09-16 NOTE — Progress Notes (Signed)
Office Visit Note   Patient: Brandon Sanford           Date of Birth: [birth year]           MRN: [id number] Visit Date: 09/16/2016              Requested by: Alroy Dust, L.Marlou Sa, [place] [address] [place], [place] [address] PCP: Alroy Dust, L.Marlou Sa, MD   Assessment & Plan: Visit Diagnoses:  1. Pain in left hip   2. Unilateral primary osteoarthritis, left hip     Plan: Based on his clinical exam and x-ray findings I do feel that he has mild to moderate arthritis of the left hip. He is definitely a candidate for an intra-articular steroid injection left hip. He is agreeable to this as well so we will have this set up by Dr. Ernestina Patches. I'll see him back myself in 4 weeks and by then he'll RN injection and we can see how is doing. All questions were encouraged and answered.  Follow-Up Instructions: Return in about 4 weeks (around 10/14/2016).   Orders:  Orders Placed This Encounter  Procedures  . XR HIP UNILAT W OR W/O PELVIS 2-3 VIEWS LEFT   No orders of the defined types were placed in this encounter.     Procedures: No procedures performed   Clinical Data: No additional findings.   Subjective: Chief Complaint  Patient presents with  . Hip Pain    left hip/groin pain x 4-5 weeks-NKI  The patient is a very pleasant 76 year old gentleman who comes in for evaluation treatment of hip pain of his left hip. His pain only in the groin and is been going on for about 4-5 weeks now. It does radiate down to his knee. He said sometimes is constant pain he does awake with pain. He says if he sits it doesn't her bony first gets up to take some steps a hurts quite a bit again in the groin area. He does not use an assistive device. He denies any specific injury. He said a few months ago he was not having any pain. He denies any other acute illnesses as a relates to his chief complaint of left hip pain. He also denies any back pain.  HPI  Review of Systems He denies any headache, chest  pain, shortness of breath, fever, chills, nausea, vomiting.  Objective: Vital Signs: There were no vitals taken for this visit.  Physical Exam He is alert and oriented 3 and in no acute distress Ortho Exam He gets up on the exam table easily. Right hip exam is normal. Left hip does move fluidly but pain on extremes of internal and external rotation. His knee exam shows no effusion with full range motion in the knee. He has a negative straight leg raise his back exam is normal. Specialty Comments:  No specialty comments available.  Imaging: Xr Hip Unilat W Or W/o Pelvis 2-3 Views Left  Result Date: 09/16/2016 An AP pelvis and lateral of his left hip shows moderate arthritic changes with joint space narrowing and para-articular osteophytes.    PMFS History: Patient Active Problem List   Diagnosis Date Noted  . Pain in 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  . 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 Umbilical pain 22/97/9892  . ASTHMA W/ COPD 06/03/2007  . PULMONARY NODULE 04/27/2007  . DYSPNEA 04/27/2007  . HYPOTHYROIDISM 04/24/2007  . DEPRESSION 04/24/2007  . HYPERTENSION 04/24/2007   Past Medical History:  Diagnosis Date  . Allergy   .  Asthma    as a child  . COPD (chronic obstructive pulmonary disease) ([place])   . Depression    pt denies  . Hyperlipidemia   . Hypertension   . Inguinal hernia   . Plantar fasciitis   . Skin moles   . Thyroid disease   . Umbilical hernia     Family History  Problem Relation Age of Onset  . Colon cancer Neg Hx     Past Surgical History:  Procedure Laterality Date  . HERNIA REPAIR  19/3/79   umbilical hernia and LIH repair   . INGUINAL HERNIA REPAIR  11/26/2010   Procedure: HERNIA REPAIR INGUINAL ADULT;  Surgeon: Zenovia Jarred, MD;  Location: [place];  Service: General;  Laterality: Left;  left inguinal hernia repair with mesh  . TONSILLECTOMY    . UMBILICAL HERNIA REPAIR  11/26/2010   Procedure: HERNIA REPAIR  UMBILICAL ADULT;  Surgeon: Zenovia Jarred, MD;  Location: [facility];  Service: General;  Laterality: N/A;  umbilical hernia repair with mesh   Social History   Occupational History  . Not on file.   Social History Main Topics  . Smoking status: Former Smoker    Quit date: 11/12/1995  . Smokeless tobacco: Never Used  . Alcohol use Yes     Comment: rare  . Drug use: No  . Sexual activity: Not on file

## 2016-09-25 ENCOUNTER — Ambulatory Visit (INDEPENDENT_AMBULATORY_CARE_PROVIDER_SITE_OTHER): Payer: Medicare HMO | Admitting: Physical Medicine and Rehabilitation

## 2016-09-25 ENCOUNTER — Ambulatory Visit (INDEPENDENT_AMBULATORY_CARE_PROVIDER_SITE_OTHER): Payer: Medicare HMO

## 2016-09-25 DIAGNOSIS — M25552 Pain in left hip: Secondary | ICD-10-CM | POA: Diagnosis not present

## 2016-09-25 NOTE — Patient Instructions (Signed)

## 2016-09-25 NOTE — Progress Notes (Deleted)
Left hip and groin pain for around 6 weeks. Constant. Worse the more active he is and with playing golf.

## 2016-09-25 NOTE — Progress Notes (Signed)
Brandon Sanford - 76 y.o. male MRN [id number]  Date of birth: 03-Jul-1940  Office Visit Note: Visit Date: 09/25/2016 PCP: Alroy Dust, L.Marlou Sa, MD Referred by: Alroy Dust, L.Marlou Sa, MD  Subjective: Chief Complaint  Patient presents with  . Left Hip - Pain   HPI: Brandon Sanford is a 76 year old gentleman with left hip pain chronically pain in the groin for around 6 weeks. It's pretty constant pain worse when he is more active. Dr. Ninfa Linden who follows him for his orthopedic care request diagnostic and therapeutic anesthetic hip arthrogram.    ROS Otherwise per HPI.  Assessment & Plan: Visit Diagnoses:  1. Pain in left hip     Plan: Findings:  Diagnostic and therapeutic left intra-articular anesthetic hip arthrogram. Patient did have relief anesthetic phase.    Meds & Orders: No orders of the defined types were placed in this encounter.   Orders Placed This Encounter  Procedures  . Large Joint Injection/Arthrocentesis  . XR C-ARM NO REPORT    Follow-up: Return for Dr. Ninfa Linden.   Procedures: Anesthetic hip arthrogram with fluoroscopic guidance Date/Time: 09/25/2016 1:54 PM Performed by: Magnus Sinning Authorized by: Magnus Sinning   Consent Given by:  Patient Site marked: the procedure site was marked   Timeout: prior to procedure the correct patient, procedure, and site was verified   Indications:  Pain and diagnostic evaluation Location:  Hip Site:  L hip joint Prep: patient was prepped and draped in usual sterile fashion   Needle Size:  22 G Approach:  Anterior Ultrasound Guidance: No   Fluoroscopic Guidance: No   Arthrogram: Yes   Medications:  3 mL bupivacaine 0.5 %; 80 mg triamcinolone acetonide 40 MG/ML Aspiration Attempted: Yes   Patient tolerance:  Patient tolerated the procedure well with no immediate complications  Arthrogram demonstrated excellent flow of contrast throughout the joint surface without extravasation or obvious defect.  The patient had relief of  symptoms during the anesthetic phase of the injection.      No notes on file   Clinical History: No specialty comments available.  He reports that he quit smoking about 20 years ago. He has never used smokeless tobacco. No results for input(s): HGBA1C, LABURIC in the last 8760 hours.  Objective:  VS:  HT:    WT:   BMI:     BP:   HR: bpm  TEMP: ( )  RESP:  Physical Exam  Musculoskeletal:  Painful range of motion of the left hip. Patient does get groin pain with internal rotation.    Ortho Exam Imaging: No results found.  Past Medical/Family/Surgical/Social History: Medications & Allergies reviewed per EMR Patient Active Problem List   Diagnosis Date Noted  . Pain in 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  . 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 Umbilical pain 51/88/4166  . ASTHMA W/ COPD 06/03/2007  . PULMONARY NODULE 04/27/2007  . DYSPNEA 04/27/2007  . HYPOTHYROIDISM 04/24/2007  . DEPRESSION 04/24/2007  . HYPERTENSION 04/24/2007   Past Medical History:  Diagnosis Date  . Allergy   . Asthma    as a child  . COPD (chronic obstructive pulmonary disease) ([place])   . Depression    pt denies  . Hyperlipidemia   . Hypertension   . Inguinal hernia   . Plantar fasciitis   . Skin moles   . Thyroid disease   . Umbilical hernia    Family History  Problem Relation Age of Onset  . Colon cancer Neg Hx    Past Surgical History:  Procedure Laterality Date  . HERNIA REPAIR  00/3/49   umbilical hernia and LIH repair   . INGUINAL HERNIA REPAIR  11/26/2010   Procedure: HERNIA REPAIR INGUINAL ADULT;  Surgeon: Zenovia Jarred, MD;  Location: [facility];  Service: General;  Laterality: Left;  left inguinal hernia repair with mesh  . TONSILLECTOMY    . UMBILICAL HERNIA REPAIR  11/26/2010   Procedure: HERNIA REPAIR UMBILICAL ADULT;  Surgeon: Zenovia Jarred, MD;  Location: [facility];  Service: General;  Laterality: N/A;  umbilical hernia repair  with mesh   Social History   Occupational History  . Not on file.   Social History Main Topics  . Smoking status: Former Smoker    Quit date: 11/12/1995  . Smokeless tobacco: Never Used  . Alcohol use Yes     Comment: rare  . Drug use: No  . Sexual activity: Not on file

## 2016-09-30 MED ORDER — TRIAMCINOLONE ACETONIDE 40 MG/ML IJ SUSP
80.0000 mg | INTRAMUSCULAR | Status: AC | PRN
  Administered 2016-09-25: 80 mg via INTRA_ARTICULAR

## 2016-09-30 MED ORDER — BUPIVACAINE HCL 0.5 % IJ SOLN
3.0000 mL | INTRAMUSCULAR | Status: AC | PRN
  Administered 2016-09-25: 3 mL via INTRA_ARTICULAR

## 2016-10-07 ENCOUNTER — Ambulatory Visit (INDEPENDENT_AMBULATORY_CARE_PROVIDER_SITE_OTHER): Payer: Medicare HMO | Admitting: Orthopaedic Surgery

## 2016-10-07 DIAGNOSIS — M1612 Unilateral primary osteoarthritis, left hip: Secondary | ICD-10-CM

## 2016-10-07 NOTE — Progress Notes (Signed)
The patient is returning for follow-up after an intra-articular injection in his left hip by Dr. Ernestina Patches under direct fluoroscopy. He says that is helped a bit. He still has start up pain when he first gets going but this is certainly taken the edge off of things enough to where he does not want to pursue any other treatment such as a hip replacement until we see how long this works for him. On exam he tolerates me much easier putting his left hip through internal and external rotation. There is some pain there but it is less than what he was when I first examined him. He is just under 2 weeks since that left hip injection.  We went over the injection again in detail and talked about the timing of another injection which he understands with hips we should wait at least 5 months. He can always call in the late winter if he would like to be set up for another injection. However if his pain gets severe in the next 1-3 months he would need to consider hip replacement surgery. All questions were encouraged and answered.

## 2016-10-08 DIAGNOSIS — H43812 Vitreous degeneration, left eye: Secondary | ICD-10-CM | POA: Diagnosis not present

## 2016-10-08 DIAGNOSIS — H2512 Age-related nuclear cataract, left eye: Secondary | ICD-10-CM | POA: Diagnosis not present

## 2016-10-14 ENCOUNTER — Ambulatory Visit (INDEPENDENT_AMBULATORY_CARE_PROVIDER_SITE_OTHER): Payer: Medicare HMO | Admitting: Orthopaedic Surgery

## 2017-01-27 ENCOUNTER — Ambulatory Visit (INDEPENDENT_AMBULATORY_CARE_PROVIDER_SITE_OTHER): Payer: Medicare HMO | Admitting: Orthopaedic Surgery

## 2017-01-27 ENCOUNTER — Encounter (INDEPENDENT_AMBULATORY_CARE_PROVIDER_SITE_OTHER): Payer: Self-pay | Admitting: Orthopaedic Surgery

## 2017-01-27 DIAGNOSIS — M25562 Pain in left knee: Secondary | ICD-10-CM | POA: Diagnosis not present

## 2017-01-27 DIAGNOSIS — M1612 Unilateral primary osteoarthritis, left hip: Secondary | ICD-10-CM

## 2017-01-27 MED ORDER — LIDOCAINE HCL 1 % IJ SOLN
3.0000 mL | INTRAMUSCULAR | Status: AC | PRN
  Administered 2017-01-27: 3 mL

## 2017-01-27 MED ORDER — METHYLPREDNISOLONE ACETATE 40 MG/ML IJ SUSP
40.0000 mg | INTRAMUSCULAR | Status: AC | PRN
  Administered 2017-01-27: 40 mg via INTRA_ARTICULAR

## 2017-01-27 NOTE — Progress Notes (Signed)
Office Visit Note   Patient: Brandon Sanford           Date of Birth: January 05, 1941           MRN: [id number] Visit Date: 01/27/2017              Requested by: Alroy Dust, L.Marlou Sa, [place] [address] [place], [place] [address] PCP: Alroy Dust, L.Marlou Sa, MD   Assessment & Plan: Visit Diagnoses:  1. Unilateral primary osteoarthritis, left hip   2. Acute pain of left knee     Plan: He is still not interested in a hip replacement surgery as of yet and I agree with holding off until he is failed all conservative treatment measures.  I do feel it is okay to try one more intra-articular injection in his left hip but in the month of February which she will then put him 5 months out from his last injection.  Also offered him a steroid injection his left knee today and is agreeable to this.  I explained the risk and benefits of injections and tolerated this well.  I will see him back myself in 8 weeks.  In the interim in February he will of had a steroid injection by Dr. Ernestina Patches in the left hip joint.  Follow-Up Instructions: Return in about 8 weeks (around 03/24/2017).   Orders:  Orders Placed This Encounter  Procedures  . Large Joint Inj   No orders of the defined types were placed in this encounter.     Procedures: Large Joint Inj: L knee on 01/27/2017 1:05 PM Indications: diagnostic evaluation and pain Details: 22 G 1.5 in needle, superolateral approach  Arthrogram: No  Medications: 3 mL lidocaine 1 %; 40 mg methylPREDNISolone acetate 40 MG/ML Outcome: tolerated well, no immediate complications Procedure, treatment alternatives, risks and benefits explained, specific risks discussed. Consent was given by the patient. Immediately prior to procedure a time out was called to verify the correct patient, procedure, equipment, support staff and site/side marked as required. Patient was prepped and draped in the usual sterile fashion.       Clinical Data: No additional  findings.   Subjective: Chief Complaint  Patient presents with  . Left Hip - Pain  . Left Knee - Pain  The patient has known osteoarthritis of his left hip.  He had a successful intra-articular injection in September by Dr. Ernestina Patches this was just 4 months ago.  He has been having some left knee pain since then and some left groin pain.  He like to consider another steroid injection in his left hip and another month from now which I agree with.  His knees are bothering him on the lateral aspect of his knee around the IT band and just distal to this but also the knee joint itself.  He denies any specific injury to that left knee.  He denies any locking catching.  HPI  Review of Systems He currently denies any headache, chest pain, shortness of breath, fever, chills, nausea, vomiting.  Objective: Vital Signs: There were no vitals taken for this visit.  Physical Exam He is alert and oriented x3 and in no acute distress Ortho Exam He does walk with a slight limp.  He has varus malalignment of his left knee but no effusion.  He hurts on the lateral joint line in the medial joint line as well as IT band.  The knee is ligamentously stable with full range of motion.  He does have some limitations  in the groin with internal extra rotation of his left hip with the groin pain as well. Specialty Comments:  No specialty comments available.  Imaging: No results found.   PMFS History: Patient Active Problem List   Diagnosis Date Noted  . Pain in 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  . 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 Umbilical pain 27/06/2374  . ASTHMA W/ COPD 06/03/2007  . PULMONARY NODULE 04/27/2007  . DYSPNEA 04/27/2007  . HYPOTHYROIDISM 04/24/2007  . DEPRESSION 04/24/2007  . HYPERTENSION 04/24/2007   Past Medical History:  Diagnosis Date  . Allergy   . Asthma    as a child  . COPD (chronic obstructive pulmonary disease) ([place])   . Depression    pt denies  . Hyperlipidemia    . Hypertension   . Inguinal hernia   . Plantar fasciitis   . Skin moles   . Thyroid disease   . Umbilical hernia     Family History  Problem Relation Age of Onset  . Colon cancer Neg Hx     Past Surgical History:  Procedure Laterality Date  . HERNIA REPAIR  28/3/15   umbilical hernia and LIH repair   . INGUINAL HERNIA REPAIR  11/26/2010   Procedure: HERNIA REPAIR INGUINAL ADULT;  Surgeon: Zenovia Jarred, MD;  Location: [facility];  Service: General;  Laterality: Left;  left inguinal hernia repair with mesh  . TONSILLECTOMY    . UMBILICAL HERNIA REPAIR  11/26/2010   Procedure: HERNIA REPAIR UMBILICAL ADULT;  Surgeon: Zenovia Jarred, MD;  Location: [facility];  Service: General;  Laterality: N/A;  umbilical hernia repair with mesh   Social History   Occupational History  . Not on file  Tobacco Use  . Smoking status: Former Smoker    Last attempt to quit: 11/12/1995    Years since quitting: 21.2  . Smokeless tobacco: Never Used  Substance and Sexual Activity  . Alcohol use: Yes    Comment: rare  . Drug use: No  . Sexual activity: Not on file

## 2017-01-28 ENCOUNTER — Other Ambulatory Visit (INDEPENDENT_AMBULATORY_CARE_PROVIDER_SITE_OTHER): Payer: Self-pay

## 2017-01-28 DIAGNOSIS — M25552 Pain in left hip: Secondary | ICD-10-CM

## 2017-02-28 ENCOUNTER — Ambulatory Visit (INDEPENDENT_AMBULATORY_CARE_PROVIDER_SITE_OTHER): Payer: Medicare HMO | Admitting: Physical Medicine and Rehabilitation

## 2017-02-28 ENCOUNTER — Encounter (INDEPENDENT_AMBULATORY_CARE_PROVIDER_SITE_OTHER): Payer: Self-pay | Admitting: Physical Medicine and Rehabilitation

## 2017-02-28 ENCOUNTER — Ambulatory Visit (INDEPENDENT_AMBULATORY_CARE_PROVIDER_SITE_OTHER): Payer: Medicare HMO

## 2017-02-28 DIAGNOSIS — M25552 Pain in left hip: Secondary | ICD-10-CM

## 2017-02-28 NOTE — Progress Notes (Deleted)
Pt states a sharp pain in left hip, left knee, and left lower leg. Pt states pain has been there since post injection 09/25/16. Pt states injection helped some, but did not last long. Pt states normal activities make pain worse, pt states pain wakes him out of his sleep. Pt states staying off the left hip eases pain. -Dye Allergies.

## 2017-02-28 NOTE — Patient Instructions (Signed)

## 2017-02-28 NOTE — Progress Notes (Signed)
Brandon Sanford - 77 y.o. male MRN [id number]  Date of birth: 1940/08/08  Office Visit Note: Visit Date: 02/28/2017 PCP: Brandon Sanford Referred by: Brandon Sanford  Subjective: Chief Complaint  Patient presents with  . Left Hip - Pain  . Left Knee - Pain  . Left Lower Leg - Pain   HPI: Brandon Sanford is a 77 year old gentleman with known left hip osteoarthritis who received an intra-articular injection back in September with good relief of symptoms at least temporarily for a few months.  Comes in today with worsening left hip and groin pain.  Does refer down to the knee and a little bit past the knee at times.  Reports that normal activities make his pain worse as does standing.  He has not really had a lumbar spine workup.  We will repeat the injection from a diagnostic and hopefully therapeutic standpoint.    ROS Otherwise per HPI.  Assessment & Plan: Visit Diagnoses:  1. Pain in left hip     Plan: Findings:  Left hip intra-articular scopic guidance.  Patient did have good relief during the anesthetic phase.    Meds & Orders: No orders of the defined types were placed in this encounter.   Orders Placed This Encounter  Procedures  . Large Joint Inj: L hip joint  . XR C-ARM NO REPORT    Follow-up: Return if symptoms worsen or fail to improve, for Dr. Ninfa Linden.   Procedures: Large Joint Inj: L hip joint on 02/28/2017 8:18 AM Indications: diagnostic evaluation and pain Details: 22 G 3.5 in needle, fluoroscopy-guided anterior approach  Arthrogram: No  Medications: 3 mL bupivacaine 0.5 %; 80 mg triamcinolone acetonide 40 MG/ML Outcome: tolerated well, no immediate complications  There was excellent flow of contrast producing a partial arthrogram of the hip. The patient did have relief of symptoms during the anesthetic phase of the injection. Procedure, treatment alternatives, risks and benefits explained, specific risks discussed. Consent was given by the patient.  Immediately prior to procedure a time out was called to verify the correct patient, procedure, equipment, support staff and site/side marked as required. Patient was prepped and draped in the usual sterile fashion.      No notes on file   Clinical History: No specialty comments available.  He reports that he quit smoking about 21 years ago. he has never used smokeless tobacco. No results for input(s): HGBA1C, LABURIC in the last 8760 hours.  Objective:  VS:  HT:    WT:   BMI:     BP:   HR: bpm  TEMP: ( )  RESP:  Physical Exam  Ortho Exam Imaging: No results found.  Past Medical/Family/Surgical/Social History: Medications & Allergies reviewed per EMR Patient Active Problem List   Diagnosis Date Noted  . Pain in 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  . 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 Umbilical pain 25/42/7062  . ASTHMA W/ COPD 06/03/2007  . PULMONARY NODULE 04/27/2007  . DYSPNEA 04/27/2007  . HYPOTHYROIDISM 04/24/2007  . DEPRESSION 04/24/2007  . HYPERTENSION 04/24/2007   Past Medical History:  Diagnosis Date  . Allergy   . Asthma    as a child  . COPD (chronic obstructive pulmonary disease) ([place])   . Depression    pt denies  . Hyperlipidemia   . Hypertension   . Inguinal hernia   . Plantar fasciitis   . Skin moles   . Thyroid disease   . Umbilical hernia    Family History  Problem Relation Age of Onset  . Colon cancer Neg Hx    Past Surgical History:  Procedure Laterality Date  . HERNIA REPAIR  38/1/77   umbilical hernia and LIH repair   . INGUINAL HERNIA REPAIR  11/26/2010   Procedure: HERNIA REPAIR INGUINAL ADULT;  Surgeon: Zenovia Jarred, Sanford;  Location: [facility];  Service: General;  Laterality: Left;  left inguinal hernia repair with mesh  . TONSILLECTOMY    . UMBILICAL HERNIA REPAIR  11/26/2010   Procedure: HERNIA REPAIR UMBILICAL ADULT;  Surgeon: Zenovia Jarred, Sanford;  Location: [facility];  Service: General;   Laterality: N/A;  umbilical hernia repair with mesh   Social History   Occupational History  . Not on file  Tobacco Use  . Smoking status: Former Smoker    Last attempt to quit: 11/12/1995    Years since quitting: 21.3  . Smokeless tobacco: Never Used  Substance and Sexual Activity  . Alcohol use: Yes    Comment: rare  . Drug use: No  . Sexual activity: Not on file

## 2017-03-03 MED ORDER — BUPIVACAINE HCL 0.5 % IJ SOLN
3.0000 mL | INTRAMUSCULAR | Status: AC | PRN
  Administered 2017-02-28: 3 mL via INTRA_ARTICULAR

## 2017-03-03 MED ORDER — TRIAMCINOLONE ACETONIDE 40 MG/ML IJ SUSP
80.0000 mg | INTRAMUSCULAR | Status: AC | PRN
  Administered 2017-02-28: 80 mg via INTRA_ARTICULAR

## 2017-03-27 ENCOUNTER — Ambulatory Visit (INDEPENDENT_AMBULATORY_CARE_PROVIDER_SITE_OTHER): Payer: Medicare HMO | Admitting: Orthopaedic Surgery

## 2017-03-27 ENCOUNTER — Encounter (INDEPENDENT_AMBULATORY_CARE_PROVIDER_SITE_OTHER): Payer: Self-pay | Admitting: Orthopaedic Surgery

## 2017-03-27 DIAGNOSIS — M1612 Unilateral primary osteoarthritis, left hip: Secondary | ICD-10-CM

## 2017-03-27 NOTE — Progress Notes (Signed)
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  He said today's have a good day and injections are doing well thus far.  He says he is not ready for joint replacement surgery yet.  On exam he does have good range of motion of his left hip with pain on the extremes of rotation but no blocks rotation which is good.  His knee exam is better overall from her I placed a knee injection and back in January.  At this point is can watch him closely and have him follow-up as needed.  He understands that we can always have another left hip steroid injection done under fluoroscopy but he would need to wait at least 5-6 months and he understands this as well.  If things bother him enough he will let us know.  All questions concerns were answered and addressed.  As well as follow-up as needed.

## 2017-04-23 DIAGNOSIS — Z5321 Procedure and treatment not carried out due to patient leaving prior to being seen by health care provider: Secondary | ICD-10-CM | POA: Diagnosis not present

## 2017-04-23 DIAGNOSIS — K4090 Unilateral inguinal hernia, without obstruction or gangrene, not specified as recurrent: Secondary | ICD-10-CM | POA: Insufficient documentation

## 2017-04-24 ENCOUNTER — Encounter (HOSPITAL_COMMUNITY): Payer: Self-pay

## 2017-04-24 ENCOUNTER — Emergency Department (HOSPITAL_COMMUNITY)
Admission: EM | Admit: 2017-04-24 | Discharge: 2017-04-24 | Disposition: A | Discharge status: Home / Self Care | Payer: Medicare HMO | Attending: Emergency Medicine | Admitting: Emergency Medicine

## 2017-04-24 DIAGNOSIS — K4090 Unilateral inguinal hernia, without obstruction or gangrene, not specified as recurrent: Secondary | ICD-10-CM | POA: Diagnosis not present

## 2017-04-24 LAB — BASIC METABOLIC PANEL
Anion gap: 6 (ref 5–15)
BUN: 22 mg/dL — ABNORMAL HIGH (ref 6–20)
CO2: 25 mmol/L (ref 22–32)
CREATININE: 1.12 mg/dL (ref 0.61–1.24)
Calcium: 9.8 mg/dL (ref 8.9–10.3)
Chloride: 109 mmol/L (ref 101–111)
GFR calc Af Amer: >60 mL/min (ref >60)
GLUCOSE: 125 mg/dL — AB (ref 65–99)
POTASSIUM: 3.9 mmol/L (ref 3.5–5.1)
SODIUM: 140 mmol/L (ref 135–145)

## 2017-04-24 LAB — CBC
HEMATOCRIT: 39 % (ref 39.0–52.0)
Hemoglobin: 12.8 g/dL — ABNORMAL LOW (ref 13.0–17.0)
MCH: 29.2 pg (ref 26.0–34.0)
MCHC: 32.8 g/dL (ref 30.0–36.0)
MCV: 89 fL (ref 78.0–100.0)
PLATELETS: 253 10*3/uL (ref 150–400)
RBC: 4.38 MIL/uL (ref 4.22–5.81)
RDW: 13.2 % (ref 11.5–15.5)
WBC: 7.7 10*3/uL (ref 4.0–10.5)

## 2017-04-24 NOTE — ED Triage Notes (Signed)
Reports inguinal hernia on R side that popped out about an hour ago, hx of hernia repair on L

## 2017-04-24 NOTE — ED Notes (Signed)
Pt reports now feeling dizzy and nauseated

## 2017-04-24 NOTE — ED Notes (Signed)
04/24/2017, Attempted follow-up call. No answer, phone rang busy

## 2017-04-24 NOTE — ED Notes (Signed)
No answer for treatment room. 

## 2017-04-28 DIAGNOSIS — H52223 Regular astigmatism, bilateral: Secondary | ICD-10-CM | POA: Diagnosis not present

## 2017-04-28 DIAGNOSIS — H524 Presbyopia: Secondary | ICD-10-CM | POA: Diagnosis not present

## 2017-04-28 DIAGNOSIS — H04123 Dry eye syndrome of bilateral lacrimal glands: Secondary | ICD-10-CM | POA: Diagnosis not present

## 2017-04-28 DIAGNOSIS — H2513 Age-related nuclear cataract, bilateral: Secondary | ICD-10-CM | POA: Diagnosis not present

## 2017-04-28 DIAGNOSIS — H43812 Vitreous degeneration, left eye: Secondary | ICD-10-CM | POA: Diagnosis not present

## 2017-05-07 ENCOUNTER — Encounter (HOSPITAL_COMMUNITY): Payer: Self-pay | Admitting: Emergency Medicine

## 2017-05-07 ENCOUNTER — Emergency Department (HOSPITAL_COMMUNITY): Payer: Medicare HMO

## 2017-05-07 ENCOUNTER — Emergency Department (HOSPITAL_COMMUNITY)
Admission: EM | Admit: 2017-05-07 | Discharge: 2017-05-07 | Disposition: A | Discharge status: Home / Self Care | Payer: Medicare HMO | Attending: Emergency Medicine | Admitting: Emergency Medicine

## 2017-05-07 DIAGNOSIS — Z7982 Long term (current) use of aspirin: Secondary | ICD-10-CM | POA: Insufficient documentation

## 2017-05-07 DIAGNOSIS — J449 Chronic obstructive pulmonary disease, unspecified: Secondary | ICD-10-CM | POA: Diagnosis not present

## 2017-05-07 DIAGNOSIS — E039 Hypothyroidism, unspecified: Secondary | ICD-10-CM | POA: Diagnosis not present

## 2017-05-07 DIAGNOSIS — Z87891 Personal history of nicotine dependence: Secondary | ICD-10-CM | POA: Insufficient documentation

## 2017-05-07 DIAGNOSIS — Z79899 Other long term (current) drug therapy: Secondary | ICD-10-CM | POA: Insufficient documentation

## 2017-05-07 DIAGNOSIS — K4090 Unilateral inguinal hernia, without obstruction or gangrene, not specified as recurrent: Secondary | ICD-10-CM | POA: Insufficient documentation

## 2017-05-07 DIAGNOSIS — R1031 Right lower quadrant pain: Secondary | ICD-10-CM | POA: Diagnosis present

## 2017-05-07 DIAGNOSIS — I10 Essential (primary) hypertension: Secondary | ICD-10-CM | POA: Diagnosis not present

## 2017-05-07 LAB — I-STAT CHEM 8, ED
BUN: 19 mg/dL (ref 6–20)
CHLORIDE: 106 mmol/L (ref 101–111)
Calcium, Ion: 1.25 mmol/L (ref 1.15–1.40)
Creatinine, Ser: 1.1 mg/dL (ref 0.61–1.24)
GLUCOSE: 103 mg/dL — AB (ref 65–99)
HCT: 39 % (ref 39.0–52.0)
Hemoglobin: 13.3 g/dL (ref 13.0–17.0)
Potassium: 4.3 mmol/L (ref 3.5–5.1)
SODIUM: 141 mmol/L (ref 135–145)
TCO2: 24 mmol/L (ref 22–32)

## 2017-05-07 LAB — CBC WITH DIFFERENTIAL/PLATELET
Basophils Absolute: 0 10*3/uL (ref 0.0–0.1)
Basophils Relative: 0 %
Eosinophils Absolute: 0.2 10*3/uL (ref 0.0–0.7)
Eosinophils Relative: 3 %
HEMATOCRIT: 40.5 % (ref 39.0–52.0)
HEMOGLOBIN: 13.4 g/dL (ref 13.0–17.0)
LYMPHS ABS: 1 10*3/uL (ref 0.7–4.0)
LYMPHS PCT: 17 %
MCH: 29.7 pg (ref 26.0–34.0)
MCHC: 33.1 g/dL (ref 30.0–36.0)
MCV: 89.8 fL (ref 78.0–100.0)
Monocytes Absolute: 0.5 10*3/uL (ref 0.1–1.0)
Monocytes Relative: 8 %
NEUTROS ABS: 4.2 10*3/uL (ref 1.7–7.7)
NEUTROS PCT: 72 %
Platelets: 246 10*3/uL (ref 150–400)
RBC: 4.51 MIL/uL (ref 4.22–5.81)
RDW: 13.2 % (ref 11.5–15.5)
WBC: 5.9 10*3/uL (ref 4.0–10.5)

## 2017-05-07 LAB — COMPREHENSIVE METABOLIC PANEL
ALK PHOS: 57 U/L (ref 38–126)
ALT: 14 U/L — AB (ref 17–63)
AST: 16 U/L (ref 15–41)
Albumin: 3.9 g/dL (ref 3.5–5.0)
Anion gap: 7 (ref 5–15)
BUN: 18 mg/dL (ref 6–20)
CO2: 24 mmol/L (ref 22–32)
CREATININE: 1.19 mg/dL (ref 0.61–1.24)
Calcium: 9.7 mg/dL (ref 8.9–10.3)
Chloride: 109 mmol/L (ref 101–111)
GFR, EST NON AFRICAN AMERICAN: 58 mL/min — AB (ref >60)
Glucose, Bld: 105 mg/dL — ABNORMAL HIGH (ref 65–99)
Potassium: 4.4 mmol/L (ref 3.5–5.1)
Sodium: 140 mmol/L (ref 135–145)
Total Bilirubin: 0.4 mg/dL (ref 0.3–1.2)
Total Protein: 6.3 g/dL — ABNORMAL LOW (ref 6.5–8.1)

## 2017-05-07 MED ORDER — IOPAMIDOL (ISOVUE-300) INJECTION 61%
100.0000 mL | Freq: Once | INTRAVENOUS | Status: AC | PRN
  Administered 2017-05-07: 100 mL via INTRAVENOUS

## 2017-05-07 MED ORDER — IOPAMIDOL (ISOVUE-300) INJECTION 61%
INTRAVENOUS | Status: AC
  Filled 2017-05-07: qty 100

## 2017-05-07 MED ORDER — OXYCODONE-ACETAMINOPHEN 5-325 MG PO TABS: 1.0000 | ORAL_TABLET | ORAL | 0 refills | Status: DC | PRN

## 2017-05-07 NOTE — ED Provider Notes (Signed)
MSE was initiated and I personally evaluated the patient and placed orders (if any) at  2:00 PM on May 07, 2017.  The patient appears stable so that the remainder of the MSE may be completed by another provider.  Patient seen in fast track.  He complains of right inguinal hernia.  He states that it popped out about a month ago and improved but during his golf game today became extremely painful and bulging.  He states that has improved since he arrived at the emergency department.  He has a scheduled outpatient visit with [facility] surgery but is concerned that it may come back.    On physical examination the patient has what appear to be bilateral inguinal hernias.  Right greater than left.  It is soft and I was able to reduce some of the hernia manually.  He his pain is greatly improved.  Patient continues to be concerned about his current hernia situation given the level of pain he was in.  I have offered imaging and further workup.  I did explain that he would not likely have an emergency surgery today unless there was findings of incarceration on CT scan which I have low suspicion for given that there is no firmness and minimal tenderness at the site of the hernia currently.   Margarita Mail, PA-C 05/07/17 1553    Julianne Rice, MD 05/08/17 [phone]

## 2017-05-07 NOTE — ED Provider Notes (Signed)
[facility] EMERGENCY DEPARTMENT Provider Note   CSN: [id number] Arrival date & time: 05/07/17  3557     History   Chief Complaint Chief Complaint  Patient presents with  . Hernia    HPI Brandon Sanford is a 77 y.o. male.  Right inguinal pain for the past 2 weeks, getting worse this morning.  Past medical history includes left inguinal and periumbilical hernia repair.  He has been able to reduce the hernia himself today.  No excruciating pain or vomiting.  Severity is moderate.  He spoke to the nurse at [facility] surgery and was instructed to come to the emergency department.     Past Medical History:  Diagnosis Date  . Allergy   . Asthma    as a child  . COPD (chronic obstructive pulmonary disease) ([place])   . Depression    pt denies  . Hyperlipidemia   . Hypertension   . Inguinal hernia   . Plantar fasciitis   . Skin moles   . Thyroid disease   . Umbilical hernia     Patient Active Problem List   Diagnosis Date Noted  . Pain in 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  . 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 Umbilical pain 32/20/2542  . ASTHMA W/ COPD 06/03/2007  . PULMONARY NODULE 04/27/2007  . DYSPNEA 04/27/2007  . HYPOTHYROIDISM 04/24/2007  . DEPRESSION 04/24/2007  . HYPERTENSION 04/24/2007    Past Surgical History:  Procedure Laterality Date  . HERNIA REPAIR  70/6/23   umbilical hernia and LIH repair   . INGUINAL HERNIA REPAIR  11/26/2010   Procedure: HERNIA REPAIR INGUINAL ADULT;  Surgeon: Zenovia Jarred, MD;  Location: [facility];  Service: General;  Laterality: Left;  left inguinal hernia repair with mesh  . TONSILLECTOMY    . UMBILICAL HERNIA REPAIR  11/26/2010   Procedure: HERNIA REPAIR UMBILICAL ADULT;  Surgeon: Zenovia Jarred, MD;  Location: [place] [facility];  Service: General;  Laterality: N/A;  umbilical hernia repair with mesh        Home Medications    Prior to Admission medications     Medication Sig Start Date End Date Taking? Authorizing Provider  amLODipine (NORVASC) 5 MG tablet Take 5 mg by mouth daily.     [provider]  aspirin 81 MG tablet Take 81 mg by mouth daily.     [provider]  budesonide-formoterol (SYMBICORT) 160-4.5 MCG/ACT inhaler Inhale 2 puffs into the lungs 2 (two) times daily.    [provider]  Denture Care Products (DENTURE ADHESIVE) CREA  01/07/17   [provider]  levothyroxine (SYNTHROID, LEVOTHROID) 100 MCG tablet Take 100 mcg by mouth daily.     [provider]  Multiple Vitamins-Minerals (CENTRUM SILVER PO) Take by mouth daily.     [provider]  oxyCODONE-acetaminophen (PERCOCET) 5-325 MG tablet Take 1 tablet by mouth every 4 (four) hours as needed. 05/07/17   Nat Christen, MD  simvastatin (ZOCOR) 40 MG tablet Take 40 mg by mouth at bedtime.     [provider]    Family History Family History  Problem Relation Age of Onset  . Colon cancer Neg Hx     Social History Social History   Tobacco Use  . Smoking status: Former Smoker    Last attempt to quit: 11/12/1995    Years since quitting: 21.4  . Smokeless tobacco: Never Used  Substance Use Topics  . Alcohol use: Yes  Comment: rare  . Drug use: No     Allergies   Pneumococcal vaccines   Review of Systems Review of Systems  All other systems reviewed and are negative.    Physical Exam Updated Vital Signs BP (!) 141/73 (BP Location: Right Arm)   Pulse 70   Temp 97.8 F (36.6 C) (Oral)   Resp 18   Ht 5' 9.5" (1.765 m)   Wt 81.6 kg (180 lb)   SpO2 100%   BMI 26.20 kg/m   Physical Exam  Constitutional: He is oriented to person, place, and time. He appears well-developed and well-nourished.  HENT:  Head: Normocephalic and atraumatic.  Eyes: Conjunctivae are normal.  Neck: Neck supple.  Cardiovascular: Normal rate and regular rhythm.  Pulmonary/Chest: Effort normal and breath sounds normal.   Abdominal: Soft. Bowel sounds are normal.  Small right inguinal hernia palpated; no obvious intestinal contents  Musculoskeletal: Normal range of motion.  Neurological: He is alert and oriented to person, place, and time.  Skin: Skin is warm and dry.  Psychiatric: He has a normal mood and affect. His behavior is normal.  Nursing note and vitals reviewed.    ED Treatments / Results  Labs (all labs ordered are listed, but only abnormal results are displayed) Labs Reviewed  COMPREHENSIVE METABOLIC PANEL - Abnormal; Notable for the following components:      Result Value   Glucose, Bld 105 (*)    Total Protein 6.3 (*)    ALT 14 (*)    GFR calc non Af Amer 58 (*)    All other components within normal limits  I-STAT CHEM 8, ED - Abnormal; Notable for the following components:   Glucose, Bld 103 (*)    All other components within normal limits  CBC WITH DIFFERENTIAL/PLATELET    EKG None  Radiology Ct Abdomen Pelvis W Contrast  Result Date: 05/07/2017 CLINICAL DATA:  Right inguinal hernia with pain, initial encounter EXAM: CT ABDOMEN AND PELVIS WITH CONTRAST TECHNIQUE: Multidetector CT imaging of the abdomen and pelvis was performed using the standard protocol following bolus administration of intravenous contrast. CONTRAST:  182mL ISOVUE-300 IOPAMIDOL (ISOVUE-300) INJECTION 61% COMPARISON:  None. FINDINGS: Lower chest: No acute abnormality. Hepatobiliary: Mild fatty infiltration of the liver is noted. The gallbladder is within normal limits. No biliary ductal dilatation is seen. Pancreas: Unremarkable. No pancreatic ductal dilatation or surrounding inflammatory changes. Spleen: Normal in size without focal abnormality. Adrenals/Urinary Tract: Adrenal glands are within normal limits. Bilateral renal cysts are seen. The largest of these lie within the lower pole of the right kidney measuring 5.7 cm. No renal calculi or obstructive changes are noted. Bladder is well distended. Stomach/Bowel:  Stomach is within normal limits. Appendix appears normal. No evidence of bowel wall thickening, distention, or inflammatory changes. Vascular/Lymphatic: Aortic atherosclerosis. No enlarged abdominal or pelvic lymph nodes. Reproductive: Prostate is unremarkable. Other: Fat containing inguinal canal is noted on right. Small direct inguinal hernia is noted on the right with extension of fat within. No bowel is noted within the hernia. Prior repair of the inguinal hernia on the left is noted with some slight laxity of the repair. Some suggestion of herniated fat is noted beneath the repair which may represent a recurrent hernia. Musculoskeletal: Mild degenerative changes of lumbar spine are noted. IMPRESSION: Right direct inguinal hernia containing fat. No bowel is noted within the hernia. Slight laxity in the repair of the left inguinal hernia. Some fat is noted extending inferior to the repair consistent with a  small recurrent hernia. Electronically Signed   By: Inez Catalina M.D.   On: 05/07/2017 12:56    Procedures Procedures (including critical care time)  Medications Ordered in ED Medications  iopamidol (ISOVUE-300) 61 % injection (has no administration in time range)  iopamidol (ISOVUE-300) 61 % injection 100 mL (100 mLs Intravenous Contrast Given 05/07/17 1230)     Initial Impression / Assessment and Plan / ED Course  I have reviewed the triage vital signs and the nursing notes.  Pertinent labs & imaging results that were available during my care of the patient were reviewed by me and considered in my medical decision making (see chart for details).     Patient is obvious right inguinal hernia.  CT scan reveals no incarceration.  No acute abdomen at discharge.  His clinical condition was discussed with the PA on-call for general surgery.  Discharge medications Percocet  Final Clinical Impressions(s) / ED Diagnoses   Final diagnoses:  Right inguinal hernia    ED Discharge Orders         Ordered    oxyCODONE-acetaminophen (PERCOCET) 5-325 MG tablet  Every 4 hours PRN     05/07/17 1343       Nat Christen, MD 05/07/17 1420

## 2017-05-07 NOTE — ED Triage Notes (Addendum)
Pt reports hernia to right groin that popped out 1 week ago, he was able to pop it back in. Pt states this morning it popped out again. He states he is slowly pushing it back in put he thinks there is a part still protruding.  *PA evaluated the pt hernia for incarceration, did not feel it met criteria.

## 2017-05-07 NOTE — ED Notes (Signed)
Pt requested to have PIV taken out because he is leaving.  Dr. Lacinda Axon at bedside, pt reports he will follow up with the surgeon that performed hernia repair in the past.

## 2017-05-07 NOTE — ED Notes (Signed)
Pt transported to and from CT on stretcher with tech, tolerated well.

## 2017-05-07 NOTE — Discharge Instructions (Addendum)
You definitely have a right inguinal hernia.  Prescription for pain medicine.  Follow-up with [facility].

## 2017-05-14 ENCOUNTER — Ambulatory Visit: Payer: Self-pay | Admitting: General Surgery

## 2017-05-14 DIAGNOSIS — K4090 Unilateral inguinal hernia, without obstruction or gangrene, not specified as recurrent: Secondary | ICD-10-CM | POA: Diagnosis not present

## 2017-05-27 ENCOUNTER — Inpatient Hospital Stay (HOSPITAL_COMMUNITY): Admission: RE | Admit: 2017-05-27 | Payer: Medicare HMO | Source: Ambulatory Visit

## 2017-06-04 ENCOUNTER — Ambulatory Visit (HOSPITAL_COMMUNITY): Admission: RE | Admit: 2017-06-04 | Payer: Medicare HMO | Source: Ambulatory Visit | Admitting: General Surgery

## 2017-06-04 ENCOUNTER — Encounter (HOSPITAL_COMMUNITY): Admission: RE | Payer: Self-pay | Source: Ambulatory Visit

## 2017-06-04 SURGERY — REPAIR, HERNIA, INGUINAL, ADULT
Anesthesia: General | Laterality: Right

## 2017-07-18 DIAGNOSIS — E7800 Pure hypercholesterolemia, unspecified: Secondary | ICD-10-CM | POA: Diagnosis not present

## 2017-07-18 DIAGNOSIS — K4090 Unilateral inguinal hernia, without obstruction or gangrene, not specified as recurrent: Secondary | ICD-10-CM | POA: Diagnosis not present

## 2017-07-18 DIAGNOSIS — M1610 Unilateral primary osteoarthritis, unspecified hip: Secondary | ICD-10-CM | POA: Diagnosis not present

## 2017-07-18 DIAGNOSIS — J449 Chronic obstructive pulmonary disease, unspecified: Secondary | ICD-10-CM | POA: Diagnosis not present

## 2017-07-18 DIAGNOSIS — Z136 Encounter for screening for cardiovascular disorders: Secondary | ICD-10-CM | POA: Diagnosis not present

## 2017-07-18 DIAGNOSIS — Z0000 Encounter for general adult medical examination without abnormal findings: Secondary | ICD-10-CM | POA: Diagnosis not present

## 2017-07-18 DIAGNOSIS — I10 Essential (primary) hypertension: Secondary | ICD-10-CM | POA: Diagnosis not present

## 2017-07-18 DIAGNOSIS — E039 Hypothyroidism, unspecified: Secondary | ICD-10-CM | POA: Diagnosis not present

## 2017-07-29 ENCOUNTER — Encounter (INDEPENDENT_AMBULATORY_CARE_PROVIDER_SITE_OTHER): Payer: Self-pay | Admitting: Orthopaedic Surgery

## 2017-07-29 ENCOUNTER — Ambulatory Visit (INDEPENDENT_AMBULATORY_CARE_PROVIDER_SITE_OTHER): Payer: Medicare HMO

## 2017-07-29 ENCOUNTER — Ambulatory Visit (INDEPENDENT_AMBULATORY_CARE_PROVIDER_SITE_OTHER): Payer: Medicare HMO | Admitting: Orthopaedic Surgery

## 2017-07-29 DIAGNOSIS — M25552 Pain in left hip: Secondary | ICD-10-CM

## 2017-07-29 DIAGNOSIS — M1612 Unilateral primary osteoarthritis, left hip: Secondary | ICD-10-CM

## 2017-07-29 NOTE — Progress Notes (Signed)
Office Visit Note   Patient: Brandon Sanford           Date of Birth: 12-08-1940           MRN: [id number] Visit Date: 07/29/2017              Requested by: Brandon Sanford, Brandon Sanford, [place] [employer] [place], [place] [address] PCP: Brandon Sanford, Brandon Sa, MD   Assessment & Plan: Visit Diagnoses:  1. Pain in left hip   2. Unilateral primary osteoarthritis, left hip     Plan: We went over his x-rays in detail and talked about hip replacement surgery.  I gave him a handout about that as well.  We had a long thorough discussion about the risk and benefits of the surgery.  We talked about his intraoperative and postoperative course.  All questions concerns were answered and addressed.  I do feel that he is failed all forms conservative treatment at this point the surgery is warranted given his daily pain combined with his x-ray findings and his decreased quality of life and mobility as a result of his left hip osteoarthritis.  We will work on getting this scheduled and we will see him back in 2 weeks postoperative.  Follow-Up Instructions: Return for 2 weeks post-op.   Orders:  Orders Placed This Encounter  Procedures  . XR HIP UNILAT W OR W/O PELVIS 1V LEFT   No orders of the defined types were placed in this encounter.     Procedures: No procedures performed   Clinical Data: No additional findings.   Subjective: Chief Complaint  Patient presents with  . Left Hip - Pain  Patient is a 78 year old gentleman with known osteoarthritis and generally disease left hip.  I seen him for this for many years now.  Is slowly been getting worse for him.  He has tried activity modification.  He is worked on hip strengthening exercises with therapy as well.  He has had at least 2 intra-articular steroid injections.  At this point his pain is daily.  It is detrimentally affect his activities of living, quality of life, his mobility.  At this point he would like to proceed with hip replacement  surgery.  He would like to discuss this again today in detail.  He currently denies any headache, chest pain, shortness of breath, fever, chills, nausea, vomiting.  HPI  Review of Systems He currently denies any acute active medical issues other than a right sided hernia.  Objective: Vital Signs: There were no vitals taken for this visit.  Physical Exam He is alert and oriented x3 and in no acute distress Ortho Exam Examination of his right hip is normal.  Examination of his left hip shows severe pain with attempts of internal and external rotation of the hip.  This does radiate to the knee.  His leg lengths are near equal. Specialty Comments:  No specialty comments available.  Imaging: Xr Hip Unilat W Or W/o Pelvis 1v Left  Result Date: 07/29/2017 An AP pelvis and lateral left hip shows severe osteoarthritis and generally disease of left hip.  There is loss of superior lateral joint space with joint space narrowing.  There are periarticular osteophytes and sclerotic changes as well.  This is worsened when compared to films from 2017.    PMFS History: Patient Active Problem List   Diagnosis Date Noted  . Pain in 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  . 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 Umbilical pain 27/25/3664  .  ASTHMA W/ COPD 06/03/2007  . PULMONARY NODULE 04/27/2007  . DYSPNEA 04/27/2007  . HYPOTHYROIDISM 04/24/2007  . DEPRESSION 04/24/2007  . HYPERTENSION 04/24/2007   Past Medical History:  Diagnosis Date  . Allergy   . Asthma    as a child  . COPD (chronic obstructive pulmonary disease) ([place])   . Depression    pt denies  . Hyperlipidemia   . Hypertension   . Inguinal hernia   . Plantar fasciitis   . Skin moles   . Thyroid disease   . Umbilical hernia     Family History  Problem Relation Age of Onset  . Colon cancer Neg Hx     Past Surgical History:  Procedure Laterality Date  . HERNIA REPAIR  35/4/65   umbilical hernia and LIH repair   .  INGUINAL HERNIA REPAIR  11/26/2010   Procedure: HERNIA REPAIR INGUINAL ADULT;  Surgeon: Zenovia Jarred, MD;  Location: [facility];  Service: General;  Laterality: Left;  left inguinal hernia repair with mesh  . TONSILLECTOMY    . UMBILICAL HERNIA REPAIR  11/26/2010   Procedure: HERNIA REPAIR UMBILICAL ADULT;  Surgeon: Zenovia Jarred, MD;  Location: [facility];  Service: General;  Laterality: N/A;  umbilical hernia repair with mesh   Social History   Occupational History  . Not on file  Tobacco Use  . Smoking status: Former Smoker    Last attempt to quit: 11/12/1995    Years since quitting: 21.7  . Smokeless tobacco: Never Used  Substance and Sexual Activity  . Alcohol use: Yes    Comment: rare  . Drug use: No  . Sexual activity: Not on file

## 2017-08-29 NOTE — Pre-Procedure Instructions (Signed)
Brandon Sanford  08/29/2017      [facility] - [place], [place] [address] Phone: [phone] Fax: [phone]    Your procedure is scheduled on Tues., Aug. 20, 2019 from 1:00PM-2:45PM  Report to [facility] Admitting Entrance "A" at 11:00AM  Call this number if you have problems the morning of surgery:  [phone]   Remember:  Do not eat or drink after midnight on Aug. 19th    Take these medicines the morning of surgery with A SIP OF WATER: AmLODipine (NORVASC), Levothyroxine (SYNTHROID, LEVOTHROID), Budesonide-formoterol (SYMBICORT)   If needed Systane eye drops  7 days before surgery (8/13), stop taking all Other Aspirin Products, Vitamins, Fish oils, and Herbal medications. Also stop all NSAIDS i.e. Advil, Ibuprofen, Motrin, Aleve, Anaprox, Naproxen, BC, Goody Powders, and all Supplements.   Do not wear jewelry.  Do not wear lotions, powders, colognes, or deodorant.  Do not shave 48 hours prior to surgery.  Men may shave face.  Do not bring valuables to the hospital.  [facility] is not responsible for any belongings or valuables.  Contacts, dentures or bridgework may not be worn into surgery.  Leave your suitcase in the car.  After surgery it may be brought to your room.  For patients admitted to the hospital, discharge time will be determined by your treatment team.  Patients discharged the day of surgery will not be allowed to drive home.   Special instructions: [facility]- Preparing For Surgery  Before surgery, you can play an important role. Because skin is not sterile, your skin needs to be as free of germs as possible. You can reduce the number of germs on your skin by washing with CHG (chlorahexidine gluconate) Soap before surgery.  CHG is an antiseptic cleaner which kills germs and bonds with the skin to continue killing germs even after washing.    Oral Hygiene is also important to  reduce your risk of infection.  Remember - BRUSH YOUR TEETH THE MORNING OF SURGERY WITH YOUR REGULAR TOOTHPASTE  Please do not use if you have an allergy to CHG or antibacterial soaps. If your skin becomes reddened/irritated stop using the CHG.  Do not shave (including legs and underarms) for at least 48 hours prior to first CHG shower. It is OK to shave your face.  Please follow these instructions carefully.   1. Shower the NIGHT BEFORE SURGERY and the MORNING OF SURGERY with CHG.   2. If you chose to wash your hair, wash your hair first as usual with your normal shampoo.  3. After you shampoo, rinse your hair and body thoroughly to remove the shampoo.  4. Use CHG as you would any other liquid soap. You can apply CHG directly to the skin and wash gently with a scrungie or a clean washcloth.   5. Apply the CHG Soap to your body ONLY FROM THE NECK DOWN.  Do not use on open wounds or open sores. Avoid contact with your eyes, ears, mouth and genitals (private parts). Wash Face and genitals (private parts)  with your normal soap.  6. Wash thoroughly, paying special attention to the area where your surgery will be performed.  7. Thoroughly rinse your body with warm water from the neck down.  8. DO NOT shower/wash with your normal soap after using and rinsing off the CHG Soap.  9. Pat yourself dry with a CLEAN TOWEL.  10.  Wear CLEAN PAJAMAS to bed the night before surgery, wear comfortable clothes the morning of surgery  11. Place CLEAN SHEETS on your bed the night of your first shower and DO NOT SLEEP WITH PETS.  Day of Surgery:  Do not apply any deodorants/lotions.  Please wear clean clothes to the hospital/surgery center.   Remember to brush your teeth WITH YOUR REGULAR TOOTHPASTE.   Please read over the following fact sheets that you were given. Pain Booklet, Coughing and Deep Breathing, MRSA Information and Surgical Site Infection Prevention

## 2017-09-01 ENCOUNTER — Other Ambulatory Visit: Payer: Self-pay

## 2017-09-01 ENCOUNTER — Encounter (HOSPITAL_COMMUNITY): Payer: Self-pay

## 2017-09-01 ENCOUNTER — Other Ambulatory Visit (INDEPENDENT_AMBULATORY_CARE_PROVIDER_SITE_OTHER): Payer: Self-pay | Admitting: Physician Assistant

## 2017-09-01 ENCOUNTER — Encounter (HOSPITAL_COMMUNITY)
Admission: RE | Admit: 2017-09-01 | Discharge: 2017-09-01 | Disposition: A | Discharge status: Home / Self Care | Payer: Medicare HMO | Source: Ambulatory Visit | Attending: Orthopaedic Surgery | Admitting: Orthopaedic Surgery

## 2017-09-01 DIAGNOSIS — Z01812 Encounter for preprocedural laboratory examination: Secondary | ICD-10-CM | POA: Diagnosis not present

## 2017-09-01 HISTORY — DX: Unspecified osteoarthritis, unspecified site: M19.90

## 2017-09-01 HISTORY — DX: Hypothyroidism, unspecified: E03.9

## 2017-09-01 HISTORY — DX: Malignant (primary) neoplasm, unspecified: C80.1

## 2017-09-01 HISTORY — DX: Dyspnea, unspecified: R06.00

## 2017-09-01 LAB — CBC
HEMATOCRIT: 43.8 % (ref 39.0–52.0)
Hemoglobin: 14.2 g/dL (ref 13.0–17.0)
MCH: 28.5 pg (ref 26.0–34.0)
MCHC: 32.4 g/dL (ref 30.0–36.0)
MCV: 88 fL (ref 78.0–100.0)
PLATELETS: 215 10*3/uL (ref 150–400)
RBC: 4.98 MIL/uL (ref 4.22–5.81)
RDW: 13 % (ref 11.5–15.5)
WBC: 6.9 10*3/uL (ref 4.0–10.5)

## 2017-09-01 LAB — BASIC METABOLIC PANEL
Anion gap: 11 (ref 5–15)
BUN: 17 mg/dL (ref 8–23)
CHLORIDE: 109 mmol/L (ref 98–111)
CO2: 21 mmol/L — ABNORMAL LOW (ref 22–32)
Calcium: 9.6 mg/dL (ref 8.9–10.3)
Creatinine, Ser: 1.01 mg/dL (ref 0.61–1.24)
GFR calc Af Amer: >60 mL/min (ref >60)
GFR calc non Af Amer: >60 mL/min (ref >60)
GLUCOSE: 101 mg/dL — AB (ref 70–99)
POTASSIUM: 4.3 mmol/L (ref 3.5–5.1)
SODIUM: 141 mmol/L (ref 135–145)

## 2017-09-01 LAB — SURGICAL PCR SCREEN
MRSA, PCR: NEGATIVE
Staphylococcus aureus: NEGATIVE

## 2017-09-01 NOTE — Progress Notes (Signed)
Brandon Sanford denies chest pain or shortness of breath at rest.  Patient plans golf 3 times a week.  PCP is Dr Donnie Coffin, does not see a cardiologist.

## 2017-09-08 MED ORDER — TRANEXAMIC ACID 1000 MG/10ML IV SOLN
1000.0000 mg | INTRAVENOUS | Status: AC
  Filled 2017-09-08: qty 10

## 2017-09-11 DIAGNOSIS — S93401A Sprain of unspecified ligament of right ankle, initial encounter: Secondary | ICD-10-CM | POA: Diagnosis not present

## 2017-09-17 ENCOUNTER — Encounter (HOSPITAL_COMMUNITY): Payer: Self-pay | Admitting: *Deleted

## 2017-09-17 ENCOUNTER — Other Ambulatory Visit: Payer: Self-pay

## 2017-09-17 NOTE — Progress Notes (Signed)
Spoke with pt for pre-op call. Pt has a previous PAT appt for this surgery that had to be rescheduled. He states that nothing has changed with his allergies, medications, medical and surgical histories. Pt denies any recent chest pain or sob. Pt is not diabetic.

## 2017-09-18 ENCOUNTER — Encounter (HOSPITAL_COMMUNITY): Payer: Self-pay

## 2017-09-18 ENCOUNTER — Other Ambulatory Visit: Payer: Self-pay

## 2017-09-18 ENCOUNTER — Inpatient Hospital Stay (HOSPITAL_COMMUNITY): Payer: Medicare HMO

## 2017-09-18 ENCOUNTER — Encounter (HOSPITAL_COMMUNITY)
Admission: RE | Disposition: A | Discharge status: Home Health | Payer: Self-pay | Source: Ambulatory Visit | Attending: Orthopaedic Surgery

## 2017-09-18 ENCOUNTER — Inpatient Hospital Stay (HOSPITAL_COMMUNITY)
Admission: RE | Admit: 2017-09-18 | Discharge: 2017-09-20 | DRG: 470 | Disposition: A | Discharge status: Home Health | Payer: Medicare HMO | Source: Ambulatory Visit | Attending: Orthopaedic Surgery | Admitting: Orthopaedic Surgery

## 2017-09-18 ENCOUNTER — Inpatient Hospital Stay (HOSPITAL_COMMUNITY): Payer: Medicare HMO | Admitting: Anesthesiology

## 2017-09-18 DIAGNOSIS — Z96642 Presence of left artificial hip joint: Secondary | ICD-10-CM | POA: Diagnosis not present

## 2017-09-18 DIAGNOSIS — Z887 Allergy status to serum and vaccine status: Secondary | ICD-10-CM

## 2017-09-18 DIAGNOSIS — Z7951 Long term (current) use of inhaled steroids: Secondary | ICD-10-CM | POA: Diagnosis not present

## 2017-09-18 DIAGNOSIS — R911 Solitary pulmonary nodule: Secondary | ICD-10-CM | POA: Diagnosis not present

## 2017-09-18 DIAGNOSIS — E785 Hyperlipidemia, unspecified: Secondary | ICD-10-CM | POA: Diagnosis not present

## 2017-09-18 DIAGNOSIS — Z85828 Personal history of other malignant neoplasm of skin: Secondary | ICD-10-CM | POA: Diagnosis not present

## 2017-09-18 DIAGNOSIS — Z87891 Personal history of nicotine dependence: Secondary | ICD-10-CM

## 2017-09-18 DIAGNOSIS — Z79890 Hormone replacement therapy (postmenopausal): Secondary | ICD-10-CM | POA: Diagnosis not present

## 2017-09-18 DIAGNOSIS — M1612 Unilateral primary osteoarthritis, left hip: Secondary | ICD-10-CM | POA: Diagnosis not present

## 2017-09-18 DIAGNOSIS — M25752 Osteophyte, left hip: Secondary | ICD-10-CM | POA: Diagnosis present

## 2017-09-18 DIAGNOSIS — Z79899 Other long term (current) drug therapy: Secondary | ICD-10-CM | POA: Diagnosis not present

## 2017-09-18 DIAGNOSIS — I10 Essential (primary) hypertension: Secondary | ICD-10-CM | POA: Diagnosis not present

## 2017-09-18 DIAGNOSIS — E039 Hypothyroidism, unspecified: Secondary | ICD-10-CM | POA: Diagnosis not present

## 2017-09-18 DIAGNOSIS — Z471 Aftercare following joint replacement surgery: Secondary | ICD-10-CM | POA: Diagnosis not present

## 2017-09-18 DIAGNOSIS — Z7982 Long term (current) use of aspirin: Secondary | ICD-10-CM | POA: Diagnosis not present

## 2017-09-18 DIAGNOSIS — J449 Chronic obstructive pulmonary disease, unspecified: Secondary | ICD-10-CM | POA: Diagnosis not present

## 2017-09-18 DIAGNOSIS — F329 Major depressive disorder, single episode, unspecified: Secondary | ICD-10-CM | POA: Diagnosis present

## 2017-09-18 DIAGNOSIS — Z419 Encounter for procedure for purposes other than remedying health state, unspecified: Secondary | ICD-10-CM

## 2017-09-18 HISTORY — PX: TOTAL HIP ARTHROPLASTY: SHX124

## 2017-09-18 SURGERY — ARTHROPLASTY, HIP, TOTAL, ANTERIOR APPROACH
Anesthesia: Spinal | Site: Hip | Laterality: Left

## 2017-09-18 MED ORDER — ONDANSETRON HCL 4 MG/2ML IJ SOLN
INTRAMUSCULAR | Status: AC
  Filled 2017-09-18: qty 2

## 2017-09-18 MED ORDER — METHOCARBAMOL 500 MG PO TABS
ORAL_TABLET | ORAL | Status: AC
  Filled 2017-09-18: qty 1

## 2017-09-18 MED ORDER — ALUM & MAG HYDROXIDE-SIMETH 200-200-20 MG/5ML PO SUSP: 30.0000 mL | ORAL | Status: DC | PRN

## 2017-09-18 MED ORDER — EPHEDRINE SULFATE-NACL 50-0.9 MG/10ML-% IV SOSY
PREFILLED_SYRINGE | INTRAVENOUS | Status: DC | PRN
  Administered 2017-09-18: 5 mg via INTRAVENOUS
  Administered 2017-09-18: 10 mg via INTRAVENOUS

## 2017-09-18 MED ORDER — ONDANSETRON HCL 4 MG/2ML IJ SOLN: 4.0000 mg | Freq: Four times a day (QID) | INTRAMUSCULAR | Status: DC | PRN

## 2017-09-18 MED ORDER — MIDAZOLAM HCL 2 MG/2ML IJ SOLN
INTRAMUSCULAR | Status: DC | PRN
  Administered 2017-09-18: 1 mg via INTRAVENOUS

## 2017-09-18 MED ORDER — PROPOFOL 10 MG/ML IV BOLUS
INTRAVENOUS | Status: DC | PRN
  Administered 2017-09-18: 20 mg via INTRAVENOUS

## 2017-09-18 MED ORDER — CEFAZOLIN SODIUM-DEXTROSE 2-4 GM/100ML-% IV SOLN
2.0000 g | INTRAVENOUS | Status: AC
  Administered 2017-09-18: 2 g via INTRAVENOUS
  Filled 2017-09-18: qty 100

## 2017-09-18 MED ORDER — SODIUM CHLORIDE 0.9 % IV SOLN
INTRAVENOUS | Status: DC
  Administered 2017-09-18: 15:00:00 via INTRAVENOUS

## 2017-09-18 MED ORDER — PHENOL 1.4 % MT LIQD: 1.0000 | OROMUCOSAL | Status: DC | PRN

## 2017-09-18 MED ORDER — ONDANSETRON HCL 4 MG/2ML IJ SOLN
INTRAMUSCULAR | Status: DC | PRN
  Administered 2017-09-18: 4 mg via INTRAVENOUS

## 2017-09-18 MED ORDER — OXYCODONE HCL 5 MG PO TABS: 10.0000 mg | ORAL_TABLET | ORAL | Status: DC | PRN

## 2017-09-18 MED ORDER — GLYCOPYRROLATE PF 0.2 MG/ML IJ SOSY
PREFILLED_SYRINGE | INTRAMUSCULAR | Status: AC
  Filled 2017-09-18: qty 1

## 2017-09-18 MED ORDER — AMLODIPINE BESYLATE 5 MG PO TABS
5.0000 mg | ORAL_TABLET | Freq: Every day | ORAL | Status: DC
  Administered 2017-09-19 – 2017-09-20 (×2): 5 mg via ORAL
  Filled 2017-09-18 (×2): qty 1

## 2017-09-18 MED ORDER — ONDANSETRON HCL 4 MG PO TABS: 4.0000 mg | ORAL_TABLET | Freq: Four times a day (QID) | ORAL | Status: DC | PRN

## 2017-09-18 MED ORDER — ADULT MULTIVITAMIN W/MINERALS CH
1.0000 | ORAL_TABLET | Freq: Every day | ORAL | Status: DC
  Administered 2017-09-19 – 2017-09-20 (×2): 1 via ORAL
  Filled 2017-09-18 (×2): qty 1

## 2017-09-18 MED ORDER — TRANEXAMIC ACID 1000 MG/10ML IV SOLN
1000.0000 mg | INTRAVENOUS | Status: AC
  Administered 2017-09-18: 1000 mg via INTRAVENOUS
  Filled 2017-09-18: qty 10

## 2017-09-18 MED ORDER — METHOCARBAMOL 1000 MG/10ML IJ SOLN
500.0000 mg | Freq: Four times a day (QID) | INTRAVENOUS | Status: DC | PRN
  Filled 2017-09-18: qty 5

## 2017-09-18 MED ORDER — 0.9 % SODIUM CHLORIDE (POUR BTL) OPTIME
TOPICAL | Status: DC | PRN
  Administered 2017-09-18: 1000 mL

## 2017-09-18 MED ORDER — HYDROMORPHONE HCL 1 MG/ML IJ SOLN
1.0000 mg | INTRAMUSCULAR | Status: DC | PRN
  Administered 2017-09-18: 2 mg via INTRAVENOUS
  Administered 2017-09-19 (×4): 1 mg via INTRAVENOUS
  Filled 2017-09-18 (×4): qty 1
  Filled 2017-09-18: qty 2

## 2017-09-18 MED ORDER — FENTANYL CITRATE (PF) 100 MCG/2ML IJ SOLN
INTRAMUSCULAR | Status: AC
  Filled 2017-09-18: qty 2

## 2017-09-18 MED ORDER — OXYCODONE HCL 5 MG PO TABS
5.0000 mg | ORAL_TABLET | ORAL | Status: DC | PRN
  Administered 2017-09-18 – 2017-09-19 (×2): 10 mg via ORAL
  Administered 2017-09-20 (×2): 5 mg via ORAL
  Filled 2017-09-18: qty 2
  Filled 2017-09-18 (×2): qty 1

## 2017-09-18 MED ORDER — LACTATED RINGERS IV SOLN
INTRAVENOUS | Status: DC
  Administered 2017-09-18: 11:00:00 via INTRAVENOUS

## 2017-09-18 MED ORDER — SODIUM CHLORIDE 0.9 % IV SOLN
INTRAVENOUS | Status: DC | PRN
  Administered 2017-09-18: 40 ug/min via INTRAVENOUS

## 2017-09-18 MED ORDER — SIMVASTATIN 40 MG PO TABS
40.0000 mg | ORAL_TABLET | Freq: Every evening | ORAL | Status: DC
  Administered 2017-09-18: 40 mg via ORAL
  Filled 2017-09-18: qty 1

## 2017-09-18 MED ORDER — OXYCODONE HCL 5 MG PO TABS
ORAL_TABLET | ORAL | Status: AC
  Filled 2017-09-18: qty 2

## 2017-09-18 MED ORDER — METHOCARBAMOL 500 MG PO TABS
500.0000 mg | ORAL_TABLET | Freq: Four times a day (QID) | ORAL | Status: DC | PRN
  Administered 2017-09-18 – 2017-09-20 (×2): 500 mg via ORAL
  Filled 2017-09-18: qty 1

## 2017-09-18 MED ORDER — MOMETASONE FURO-FORMOTEROL FUM 200-5 MCG/ACT IN AERO
2.0000 | INHALATION_SPRAY | Freq: Two times a day (BID) | RESPIRATORY_TRACT | Status: DC
  Filled 2017-09-18: qty 8.8

## 2017-09-18 MED ORDER — PANTOPRAZOLE SODIUM 40 MG PO TBEC
40.0000 mg | DELAYED_RELEASE_TABLET | Freq: Every day | ORAL | Status: DC
  Administered 2017-09-19 – 2017-09-20 (×2): 40 mg via ORAL
  Filled 2017-09-18 (×3): qty 1

## 2017-09-18 MED ORDER — METOCLOPRAMIDE HCL 5 MG PO TABS: 5.0000 mg | ORAL_TABLET | Freq: Three times a day (TID) | ORAL | Status: DC | PRN

## 2017-09-18 MED ORDER — POLYETHYLENE GLYCOL 3350 17 G PO PACK: 17.0000 g | PACK | Freq: Every day | ORAL | Status: DC | PRN

## 2017-09-18 MED ORDER — ASPIRIN 81 MG PO CHEW
81.0000 mg | CHEWABLE_TABLET | Freq: Two times a day (BID) | ORAL | Status: DC
  Administered 2017-09-18 – 2017-09-20 (×4): 81 mg via ORAL
  Filled 2017-09-18 (×4): qty 1

## 2017-09-18 MED ORDER — PROPOFOL 500 MG/50ML IV EMUL
INTRAVENOUS | Status: DC | PRN
  Administered 2017-09-18: 80 ug/kg/min via INTRAVENOUS
  Administered 2017-09-18: 50 ug/kg/min via INTRAVENOUS

## 2017-09-18 MED ORDER — FENTANYL CITRATE (PF) 100 MCG/2ML IJ SOLN
25.0000 ug | INTRAMUSCULAR | Status: DC | PRN
  Administered 2017-09-18 (×3): 50 ug via INTRAVENOUS

## 2017-09-18 MED ORDER — HYDROMORPHONE HCL 1 MG/ML IJ SOLN
0.5000 mg | INTRAMUSCULAR | Status: DC | PRN
  Administered 2017-09-18: 1 mg via INTRAVENOUS
  Filled 2017-09-18: qty 1

## 2017-09-18 MED ORDER — LEVOTHYROXINE SODIUM 100 MCG PO TABS
100.0000 ug | ORAL_TABLET | Freq: Every day | ORAL | Status: DC
  Administered 2017-09-19 – 2017-09-20 (×2): 100 ug via ORAL
  Filled 2017-09-18 (×2): qty 1

## 2017-09-18 MED ORDER — FENTANYL CITRATE (PF) 250 MCG/5ML IJ SOLN
INTRAMUSCULAR | Status: AC
  Filled 2017-09-18: qty 5

## 2017-09-18 MED ORDER — EPHEDRINE 5 MG/ML INJ
INTRAVENOUS | Status: AC
  Filled 2017-09-18: qty 10

## 2017-09-18 MED ORDER — MENTHOL 3 MG MT LOZG: 1.0000 | LOZENGE | OROMUCOSAL | Status: DC | PRN

## 2017-09-18 MED ORDER — MIDAZOLAM HCL 2 MG/2ML IJ SOLN
INTRAMUSCULAR | Status: AC
  Filled 2017-09-18: qty 2

## 2017-09-18 MED ORDER — DOCUSATE SODIUM 100 MG PO CAPS
100.0000 mg | ORAL_CAPSULE | Freq: Two times a day (BID) | ORAL | Status: DC
  Administered 2017-09-18 – 2017-09-20 (×4): 100 mg via ORAL
  Filled 2017-09-18 (×4): qty 1

## 2017-09-18 MED ORDER — GLYCOPYRROLATE 0.2 MG/ML IJ SOLN
INTRAMUSCULAR | Status: DC | PRN
  Administered 2017-09-18 (×2): .1 mg via INTRAVENOUS

## 2017-09-18 MED ORDER — FENTANYL CITRATE (PF) 250 MCG/5ML IJ SOLN
INTRAMUSCULAR | Status: DC | PRN
  Administered 2017-09-18: 50 ug via INTRAVENOUS

## 2017-09-18 MED ORDER — DIPHENHYDRAMINE HCL 12.5 MG/5ML PO ELIX: 12.5000 mg | ORAL_SOLUTION | ORAL | Status: DC | PRN

## 2017-09-18 MED ORDER — METOCLOPRAMIDE HCL 5 MG/ML IJ SOLN: 5.0000 mg | Freq: Three times a day (TID) | INTRAMUSCULAR | Status: DC | PRN

## 2017-09-18 MED ORDER — PROMETHAZINE HCL 25 MG/ML IJ SOLN: 6.2500 mg | INTRAMUSCULAR | Status: DC | PRN

## 2017-09-18 MED ORDER — ACETAMINOPHEN 325 MG PO TABS: 325.0000 mg | ORAL_TABLET | Freq: Four times a day (QID) | ORAL | Status: DC | PRN

## 2017-09-18 MED ORDER — CEFAZOLIN SODIUM-DEXTROSE 1-4 GM/50ML-% IV SOLN
1.0000 g | Freq: Four times a day (QID) | INTRAVENOUS | Status: AC
  Administered 2017-09-18 (×2): 1 g via INTRAVENOUS
  Filled 2017-09-18 (×4): qty 50

## 2017-09-18 MED ORDER — SODIUM CHLORIDE 0.9 % IR SOLN
Status: DC | PRN
  Administered 2017-09-18: 3000 mL

## 2017-09-18 MED ORDER — CHLORHEXIDINE GLUCONATE 4 % EX LIQD: 60.0000 mL | Freq: Once | CUTANEOUS | Status: DC

## 2017-09-18 MED ORDER — ZOLPIDEM TARTRATE 5 MG PO TABS: 5.0000 mg | ORAL_TABLET | Freq: Every evening | ORAL | Status: DC | PRN

## 2017-09-18 SURGICAL SUPPLY — 54 items
BALL HIP ARTICU EZE 36 8.5 (Hips) ×1 IMPLANT
BENZOIN TINCTURE PRP APPL 2/3 (GAUZE/BANDAGES/DRESSINGS) ×2 IMPLANT
BLADE CLIPPER SURG (BLADE) IMPLANT
BLADE SAW SGTL 18X1.27X75 (BLADE) ×2 IMPLANT
CELLS DAT CNTRL 66122 CELL SVR (MISCELLANEOUS) ×1 IMPLANT
COVER SURGICAL LIGHT HANDLE (MISCELLANEOUS) ×2 IMPLANT
CUP ACET PNNCL SECTR W/GRIP 56 (Hips) ×1 IMPLANT
DRAPE C-ARM 42X72 X-RAY (DRAPES) ×2 IMPLANT
DRAPE STERI IOBAN 125X83 (DRAPES) ×2 IMPLANT
DRAPE U-SHAPE 47X51 STRL (DRAPES) ×6 IMPLANT
DRSG AQUACEL AG ADV 3.5X10 (GAUZE/BANDAGES/DRESSINGS) ×2 IMPLANT
DURAPREP 26ML APPLICATOR (WOUND CARE) ×2 IMPLANT
ELECT BLADE 4.0 EZ CLEAN MEGAD (MISCELLANEOUS) ×2
ELECT BLADE 6.5 EXT (BLADE) IMPLANT
ELECT REM PT RETURN 9FT ADLT (ELECTROSURGICAL) ×2
ELECTRODE BLDE 4.0 EZ CLN MEGD (MISCELLANEOUS) ×1 IMPLANT
ELECTRODE REM PT RTRN 9FT ADLT (ELECTROSURGICAL) ×1 IMPLANT
FACESHIELD WRAPAROUND (MASK) ×4 IMPLANT
GLOVE BIOGEL PI IND STRL 8 (GLOVE) ×2 IMPLANT
GLOVE BIOGEL PI INDICATOR 8 (GLOVE) ×2
GLOVE ECLIPSE 8.0 STRL XLNG CF (GLOVE) ×2 IMPLANT
GLOVE ORTHO TXT STRL SZ7.5 (GLOVE) ×4 IMPLANT
GOWN STRL REUS W/ TWL LRG LVL3 (GOWN DISPOSABLE) ×2 IMPLANT
GOWN STRL REUS W/ TWL XL LVL3 (GOWN DISPOSABLE) ×2 IMPLANT
GOWN STRL REUS W/TWL LRG LVL3 (GOWN DISPOSABLE) ×2
GOWN STRL REUS W/TWL XL LVL3 (GOWN DISPOSABLE) ×2
HANDPIECE INTERPULSE COAX TIP (DISPOSABLE) ×1
HIP BALL ARTICU EZE 36 8.5 (Hips) ×2 IMPLANT
KIT BASIN OR (CUSTOM PROCEDURE TRAY) ×2 IMPLANT
KIT TURNOVER KIT B (KITS) ×2 IMPLANT
LINER NEUTRAL 52MMX36MMX56N (Liner) ×2 IMPLANT
MANIFOLD NEPTUNE II (INSTRUMENTS) ×2 IMPLANT
NS IRRIG 1000ML POUR BTL (IV SOLUTION) ×2 IMPLANT
PACK TOTAL JOINT (CUSTOM PROCEDURE TRAY) ×2 IMPLANT
PAD ARMBOARD 7.5X6 YLW CONV (MISCELLANEOUS) ×2 IMPLANT
PINN SECTOR W/GRIP ACE CUP 56 (Hips) ×2 IMPLANT
RTRCTR WOUND ALEXIS 18CM MED (MISCELLANEOUS) ×2
SET HNDPC FAN SPRY TIP SCT (DISPOSABLE) ×1 IMPLANT
STAPLER VISISTAT 35W (STAPLE) IMPLANT
STEM CORAIL (Stem) ×2 IMPLANT
STRIP CLOSURE SKIN 1/2X4 (GAUZE/BANDAGES/DRESSINGS) ×2 IMPLANT
SUT ETHIBOND NAB CT1 #1 30IN (SUTURE) ×2 IMPLANT
SUT MNCRL AB 4-0 PS2 18 (SUTURE) IMPLANT
SUT VIC AB 0 CT1 27 (SUTURE) ×1
SUT VIC AB 0 CT1 27XBRD ANBCTR (SUTURE) ×1 IMPLANT
SUT VIC AB 1 CT1 27 (SUTURE) ×1
SUT VIC AB 1 CT1 27XBRD ANBCTR (SUTURE) ×1 IMPLANT
SUT VIC AB 2-0 CT1 27 (SUTURE) ×1
SUT VIC AB 2-0 CT1 TAPERPNT 27 (SUTURE) ×1 IMPLANT
TOWEL OR 17X24 6PK STRL BLUE (TOWEL DISPOSABLE) ×2 IMPLANT
TOWEL OR 17X26 10 PK STRL BLUE (TOWEL DISPOSABLE) ×2 IMPLANT
TRAY CATH 16FR W/PLASTIC CATH (SET/KITS/TRAYS/PACK) IMPLANT
TRAY FOLEY MTR SLVR 16FR STAT (SET/KITS/TRAYS/PACK) IMPLANT
WATER STERILE IRR 1000ML POUR (IV SOLUTION) ×4 IMPLANT

## 2017-09-18 NOTE — Op Note (Signed)
NAME: Brandon Sanford, Brandon Sanford MEDICAL RECORD [id number] ACCOUNT [id number] DATE OF BIRTH:[birth year] FACILITY: [facility] LOCATION: [facility] PHYSICIAN:Kesley Gaffey Kerry Fort, MD  OPERATIVE REPORT  DATE OF PROCEDURE:  09/18/2017  PREOPERATIVE DIAGNOSIS:  Primary osteoarthritis and degenerative joint disease, left hip.  POSTOPERATIVE DIAGNOSIS:  Primary osteoarthritis and degenerative joint disease, left hip.  PROCEDURE:  Left total hip arthroplasty through direct anterior approach.  IMPLANTS:  DePuy Sector Gription acetabular component size 56, size 36+0 neutral polyethylene liner, size 13 Corail femoral component with varus offset, size 36+8.5 metal hip ball.  SURGEON:  Lind Guest. Ninfa Linden, MD  ASSISTANT:  Erskine Emery, PA-C.  ANESTHESIA:  Spinal.  ANTIBIOTICS:  2 grams IV Ancef.  ESTIMATED BLOOD LOSS:  250-300 mL.  COMPLICATIONS:  None.  INDICATIONS:  The patient is well known to me.  He has debilitating arthritis involving his left hip.  I have seen him for many years now.  He has had multiple steroid injections in his left hip joint.  He is 77 years old.  At this point, his arthritis  in the left hip is detrimentally affecting his activities of daily living, his quality of life and mobility.  At this point, he does wish to proceed with total hip arthroplasty.  We talked to him about the risk of acute blood loss anemia, nerve or vessel  injury, fracture, infection, dislocation, DVT.  He understands our goals are to decrease pain, improve mobility and overall improve quality of life.  DESCRIPTION OF PROCEDURE:  After informed consent was obtained and appropriate left hip was marked and he was brought to the operating room.  Spinal anesthesia was obtained while he was on a stretcher.  He was laid in the supine position on the  stretcher.  Foley catheter was placed and both feet had traction boots applied to them.  Next, he was placed supine on the Hana fracture table with a perineal post  in place, both legs in line skeletal traction device with no traction applied.  His left  operative hip was prepped and draped with DuraPrep and sterile drapes.  A time-out was called.  He was identified, correct patient and correct left hip.  I then made my incision just inferior and posterior to the anterior superior iliac spine and carried  this obliquely down the leg.  We dissected down tensor fascia lata muscle.  Tensor fascia was then divided longitudinally to proceed with a direct anterior approach to the hip.  We identified and cauterized circumflex vessels.  I then identified the hip  capsule, opened up the hip capsule in L-type format finding no significant effusion, but significant periarticular osteoarthritis with osteophytes around the left hip.  We placed Cobra retractors around the medial and lateral femoral neck and then made  our femoral neck cut with an oscillating saw and completed this with an osteotome.  This was done just proximal to the lesser trochanter.  I placed a corkscrew guide in the femoral head and removed the femoral head in its entirety and found a large area  completely devoid of cartilage smooth and hardened.  We passed this off to the back table and then placed a bent Hohmann over the medial acetabular rim and removed remnants of the acetabular labrum and other debris.  I then began reaming under direct  visualization in stepwise increments from a size 44 going all the way up to a size 55 with all reamers under direct visualization, the last reamer under direct fluoroscopy, so I could obtain our depth  of reaming my inclination and anteversion.  I was  then able to place the real DePuy Sector Gription acetabular component size 56 and then a 36+0 neutral polyethylene liner for that size 56 acetabular component.  Attention was then turned to the femur.  With the leg externally rotated to 120 degrees,  extended and adducted, I was able to place a Mueller retractor medially and  a Hohmann retractor above the greater trochanter, released lateral joint capsule and used a box-cutting osteotome to enter femoral canal and a rongeur to lateralize and then  began broaching from a size 8 broach using the Corail broaching system up to a size 13.  With the 13 in place, we felt he needed a varus offset neck based on his preoperative standing x-rays.  We went with a varus offset femoral neck and a 36+1 hip ball,  reduced this in the acetabulum.  We felt like he was significantly short and needed more offset as well.  We dislocated the hip and removed the trial components.  We placed the real size 13 femoral component with a Corail stem with varus offset and the  real 36+8, 5 metal hip ball, reduced this in the acetabulum and we were pleased with the leg length, offset, range of motion and stability.  This was verified under visualization and clinical exam as well as fluoroscopy.  We then irrigated the soft  tissues with normal saline solution using pulsatile lavage.  We closed the joint capsule with interrupted #1 Ethibond suture, followed by running 0 Vicryl and tensor fascia, 0 Vicryl in the deep tissue, 2-0 Vicryl subcutaneous tissue, 4-0 Monocryl  subcuticular stitch and then Steri-Strips on the skin.  Aquacel dressing was applied.  He was taken off the Hana table and taken to recovery room in stable condition.  All final counts were correct.  No complications noted.  Of note, Benita Stabile, PA-C,  assisted during the entire case and assistance was crucial for facilitating all aspects of this case.  TN/NUANCE  D:09/18/2017 T:09/18/2017 JOB:[id number]

## 2017-09-18 NOTE — Brief Op Note (Signed)
09/18/2017  12:51 PM  PATIENT:  Carlean Jews  77 y.o. male  PRE-OPERATIVE DIAGNOSIS:  osteoarthritis left hip  POST-OPERATIVE DIAGNOSIS:  osteoarthritis left hip  PROCEDURE:  Procedure(s): LEFT TOTAL HIP ARTHROPLASTY ANTERIOR APPROACH (Left)  SURGEON:  Surgeon(s) and Role:    Mcarthur Rossetti, MD - Primary  PHYSICIAN ASSISTANT:  Benita Stabile, PA-C  ANESTHESIA:   spinal  EBL:  300 mL   COUNTS:  YES  DICTATION: .Other Dictation: Dictation Number [id number]  PLAN OF CARE: Admit to inpatient   PATIENT DISPOSITION:  PACU - hemodynamically stable.   Delay start of Pharmacological VTE agent (>24hrs) due to surgical blood loss or risk of bleeding: no

## 2017-09-18 NOTE — Anesthesia Preprocedure Evaluation (Signed)
Anesthesia Evaluation  Patient identified by MRN, date of birth, ID band Patient awake    Reviewed: Allergy & Precautions, NPO status , Patient's Chart, lab work & pertinent test results  History of Anesthesia Complications Negative for: history of anesthetic complications  Airway Mallampati: II  TM Distance: >3 FB Neck ROM: Full    Dental no notable dental hx. (+) Dental Advisory Given   Pulmonary asthma , COPD, former smoker,    Pulmonary exam normal        Cardiovascular hypertension, Pt. on medications Normal cardiovascular exam     Neuro/Psych PSYCHIATRIC DISORDERS Depression negative neurological ROS     GI/Hepatic negative GI ROS, Neg liver ROS,   Endo/Other  Hypothyroidism   Renal/GU negative Renal ROS  negative genitourinary   Musculoskeletal negative musculoskeletal ROS (+)   Abdominal   Peds negative pediatric ROS (+)  Hematology negative hematology ROS (+)   Anesthesia Other Findings   Reproductive/Obstetrics negative OB ROS                             Anesthesia Physical Anesthesia Plan  ASA: III  Anesthesia Plan: Spinal   Post-op Pain Management:    Induction:   PONV Risk Score and Plan: 2 and Ondansetron and Propofol infusion  Airway Management Planned: Natural Airway and Simple Face Mask  Additional Equipment:   Intra-op Plan:   Post-operative Plan:   Informed Consent: I have reviewed the patients History and Physical, chart, labs and discussed the procedure including the risks, benefits and alternatives for the proposed anesthesia with the patient or authorized representative who has indicated his/her understanding and acceptance.   Dental advisory given  Plan Discussed with: CRNA and Anesthesiologist  Anesthesia Plan Comments:         Anesthesia Quick Evaluation

## 2017-09-18 NOTE — Evaluation (Signed)
Physical Therapy Evaluation Patient Details Name: Brandon Sanford MRN: [id number] DOB: Aug 25, 1940 Today's Date: 09/18/2017   History of Present Illness  Pt is a 77 y/o male s/p L THA, direct anterior approach. PMH includes arthritis, skin cancer, HTN, and plantar fasciitis.   Clinical Impression  Pt s/p surgery above with deficits below. Gait distance limited to chair secondary to increased pain. Required min to min guard A for mobility with RW. Able to perform supine HEP. Will continue to follow acutely to maximize functional mobility independence and safety.     Follow Up Recommendations Follow surgeon's recommendation for DC plan and follow-up therapies;Supervision for mobility/OOB    Equipment Recommendations  Rolling walker with 5" wheels;3in1 (PT)    Recommendations for Other Services       Precautions / Restrictions Precautions Precautions: None Precaution Comments: Reviewed supine HEP, however, did not give handout.  Restrictions Weight Bearing Restrictions: Yes LLE Weight Bearing: Weight bearing as tolerated      Mobility  Bed Mobility Overal bed mobility: Needs Assistance Bed Mobility: Supine to Sit     Supine to sit: Supervision     General bed mobility comments: Supervision for safety. Increased time required to perform secondary to pain.   Transfers Overall transfer level: Needs assistance Equipment used: Rolling walker (2 wheeled) Transfers: Sit to/from Stand Sit to Stand: Min assist         General transfer comment: Min A for steadying assist. Verbal cues for safe hand placement.   Ambulation/Gait Ambulation/Gait assistance: Min guard Gait Distance (Feet): 5 Feet Assistive device: Rolling walker (2 wheeled) Gait Pattern/deviations: Step-to pattern;Decreased step length - right;Decreased step length - left;Decreased weight shift to right;Antalgic Gait velocity: Decreased    General Gait Details: Slow, antalgic gait. Pt very shaky and required min  guard to min A. Verbal cues for sequencing using RW. Distance limited secondary to pain.   Stairs            Wheelchair Mobility    Modified Rankin (Stroke Patients Only)       Balance Overall balance assessment: Needs assistance Sitting-balance support: No upper extremity supported;Feet supported Sitting balance-Leahy Scale: Fair     Standing balance support: Bilateral upper extremity supported;During functional activity Standing balance-Leahy Scale: Poor Standing balance comment: Reliant on BUE support.                              Pertinent Vitals/Pain Pain Assessment: Faces Faces Pain Scale: Hurts whole lot Pain Location: L hip  Pain Descriptors / Indicators: Grimacing;Operative site guarding;Aching Pain Intervention(s): Limited activity within patient's tolerance;Monitored during session;Repositioned    Home Living Family/patient expects to be discharged to:: Private residence Living Arrangements: Spouse/significant other Available Help at Discharge: Family;Available 24 hours/day Type of Home: House Home Access: Stairs to enter Entrance Stairs-Rails: Right Entrance Stairs-Number of Steps: 2-3 Home Layout: One level Home Equipment: Cane - single point      Prior Function Level of Independence: Independent with assistive device(s)         Comments: Reports occasional use of cane for ambulation      Hand Dominance        Extremity/Trunk Assessment   Upper Extremity Assessment Upper Extremity Assessment: Overall WFL for tasks assessed    Lower Extremity Assessment Lower Extremity Assessment: LLE deficits/detail LLE Deficits / Details: Deficits consistent with post op pain and weakness. Able to perform ther ex below.     Cervical /  Trunk Assessment Cervical / Trunk Assessment: Normal  Communication   Communication: No difficulties  Cognition Arousal/Alertness: Awake/alert Behavior During Therapy: WFL for tasks  assessed/performed Overall Cognitive Status: Within Functional Limits for tasks assessed                                        General Comments      Exercises Total Joint Exercises Ankle Circles/Pumps: AROM;Both;10 reps Quad Sets: AROM;Left;10 reps   Assessment/Plan    PT Assessment Patient needs continued PT services  PT Problem List Decreased strength;Decreased balance;Decreased activity tolerance;Decreased mobility;Decreased knowledge of use of DME;Decreased knowledge of precautions;Pain       PT Treatment Interventions DME instruction;Gait training;Functional mobility training;Stair training;Therapeutic activities;Therapeutic exercise;Balance training;Patient/family education    PT Goals (Current goals can be found in the Care Plan section)  Acute Rehab PT Goals Patient Stated Goal: to decrease pain  PT Goal Formulation: With patient Time For Goal Achievement: 10/02/17 Potential to Achieve Goals: Good    Frequency 7X/week   Barriers to discharge        Co-evaluation               AM-PAC PT "6 Clicks" Daily Activity  Outcome Measure Difficulty turning over in bed (including adjusting bedclothes, sheets and blankets)?: A Little Difficulty moving from lying on back to sitting on the side of the bed? : A Little Difficulty sitting down on and standing up from a chair with arms (e.g., wheelchair, bedside commode, etc,.)?: Unable Help needed moving to and from a bed to chair (including a wheelchair)?: A Little Help needed walking in hospital room?: A Little Help needed climbing 3-5 steps with a railing? : A Lot 6 Click Score: 15    End of Session Equipment Utilized During Treatment: Gait belt Activity Tolerance: Patient limited by pain Patient left: in chair;with call bell/phone within reach Nurse Communication: Mobility status PT Visit Diagnosis: Unsteadiness on feet (R26.81);Muscle weakness (generalized) (M62.81);Pain Pain - Right/Left:  Left Pain - part of body: Hip    Time: 2956-2130 PT Time Calculation (min) (ACUTE ONLY): 16 min   Charges:   PT Evaluation $PT Eval Low Complexity: 1 Low          [name], PT, DPT  Acute Rehabilitation Services  Pager: [phone]   [name] 09/18/2017, 4:57 PM

## 2017-09-18 NOTE — Plan of Care (Signed)
  Problem: Education: Goal: Knowledge of General Education information will improve Description: Including pain rating scale, medication(s)/side effects and non-pharmacologic comfort measures Outcome: Progressing   Problem: Health Behavior/Discharge Planning: Goal: Ability to manage health-related needs will improve Outcome: Progressing   Problem: Clinical Measurements: Goal: Will remain free from infection Outcome: Progressing   Problem: Activity: Goal: Risk for activity intolerance will decrease Outcome: Progressing   Problem: Pain Managment: Goal: General experience of comfort will improve Outcome: Progressing   Problem: Safety: Goal: Ability to remain free from injury will improve Outcome: Progressing   

## 2017-09-18 NOTE — H&P (Signed)
TOTAL HIP ADMISSION H&P  Patient is admitted for left total hip arthroplasty.  Subjective:  Chief Complaint: left hip pain  HPI: Brandon Sanford, 77 y.o. male, has a history of pain and functional disability in the left hip(s) due to arthritis and patient has failed non-surgical conservative treatments for greater than 12 weeks to include NSAID's and/or analgesics, corticosteriod injections, use of assistive devices and activity modification.  Onset of symptoms was gradual starting 5 years ago with gradually worsening course since that time.The patient noted no past surgery on the left hip(s).  Patient currently rates pain in the left hip at 10 out of 10 with activity. Patient has night pain, worsening of pain with activity and weight bearing, trendelenberg gait, pain that interfers with activities of daily living and pain with passive range of motion. Patient has evidence of subchondral sclerosis, periarticular osteophytes and joint space narrowing by imaging studies. This condition presents safety issues increasing the risk of falls.  There is no current active infection.  Patient Active Problem List   Diagnosis Date Noted  . Pain in 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  . 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 Umbilical pain 26/71/2458  . ASTHMA W/ COPD 06/03/2007  . PULMONARY NODULE 04/27/2007  . DYSPNEA 04/27/2007  . HYPOTHYROIDISM 04/24/2007  . DEPRESSION 04/24/2007  . HYPERTENSION 04/24/2007   Past Medical History:  Diagnosis Date  . Allergy   . Arthritis   . Asthma    as a child  . Cancer ([place])    skin - neck  "pre- cancer"  . COPD (chronic obstructive pulmonary disease) ([place])   . Depression    pt denies  . Dyspnea    With exertion  . Hyperlipidemia   . Hypertension   . Hypothyroidism   . Inguinal hernia   . Plantar fasciitis   . Skin moles   . Thyroid disease   . Umbilical hernia     Past Surgical History:  Procedure Laterality Date  . COLONOSCOPY W/ POLYPECTOMY     . HERNIA REPAIR  09/29/81   umbilical hernia and LIH repair   . INGUINAL HERNIA REPAIR  11/26/2010   Procedure: HERNIA REPAIR INGUINAL ADULT;  Surgeon: Zenovia Jarred, MD;  Location: [facility];  Service: General;  Laterality: Left;  left inguinal hernia repair with mesh  . TONSILLECTOMY    . UMBILICAL HERNIA REPAIR  11/26/2010   Procedure: HERNIA REPAIR UMBILICAL ADULT;  Surgeon: Zenovia Jarred, MD;  Location: [facility];  Service: General;  Laterality: N/A;  umbilical hernia repair with mesh    Current Facility-Administered Medications  Medication Dose Route Frequency Provider Last Rate Last Dose  . ceFAZolin (ANCEF) IVPB 2g/100 mL premix  2 g Intravenous On Call to OR Pete Pelt, PA-C      . chlorhexidine (HIBICLENS) 4 % liquid 4 application  60 mL Topical Once Pete Pelt, PA-C      . lactated ringers infusion   Intravenous Continuous Duane Boston, MD       Allergies  Allergen Reactions  . Pneumococcal Vaccines Rash    Social History   Tobacco Use  . Smoking status: Former Smoker    Years: 40.00    Last attempt to quit: 11/12/1995    Years since quitting: 21.8  . Smokeless tobacco: Never Used  Substance Use Topics  . Alcohol use: Yes    Alcohol/week: 4.0 standard drinks    Types: 4 Cans of beer per week  Family History  Problem Relation Age of Onset  . Colon cancer Neg Hx      Review of Systems  Musculoskeletal: Positive for joint pain.  All other systems reviewed and are negative.   Objective:  Physical Exam  Constitutional: He is oriented to person, place, and time. He appears well-developed and well-nourished.  HENT:  Head: Normocephalic and atraumatic.  Eyes: Pupils are equal, round, and reactive to light. EOM are normal.  Neck: Normal range of motion. Neck supple.  Cardiovascular: Normal rate and regular rhythm.  Respiratory: Effort normal and breath sounds normal.  GI: Soft. Bowel sounds are normal.   Musculoskeletal:       Left hip: He exhibits decreased range of motion, decreased strength, tenderness and bony tenderness.  Neurological: He is alert and oriented to person, place, and time.  Skin: Skin is warm and dry.  Psychiatric: He has a normal mood and affect.    Vital signs in last 24 hours: Temp:  [97.5 F (36.4 C)] 97.5 F (36.4 C) (08/29 0935) Pulse Rate:  [62] 62 (08/29 0935) Resp:  [18] 18 (08/29 0935) BP: (174)/(85) 174/85 (08/29 0935) SpO2:  [99 %] 99 % (08/29 0935) Weight:  [82.6 kg] 82.6 kg (08/29 0935)  Labs:   Estimated body mass index is 26.88 kg/m as calculated from the following:   Height as of this encounter: 5\' 9"  (1.753 m).   Weight as of this encounter: 82.6 kg.   Imaging Review Plain radiographs demonstrate severe degenerative joint disease of the left hip(s). The bone quality appears to be good for age and reported activity level.    Preoperative templating of the joint replacement has been completed, documented, and submitted to the Operating Room personnel in order to optimize intra-operative equipment management.     Assessment/Plan:  End stage arthritis, left hip(s)  The patient history, physical examination, clinical judgement of the provider and imaging studies are consistent with end stage degenerative joint disease of the left hip(s) and total hip arthroplasty is deemed medically necessary. The treatment options including medical management, injection therapy, arthroscopy and arthroplasty were discussed at length. The risks and benefits of total hip arthroplasty were presented and reviewed. The risks due to aseptic loosening, infection, stiffness, dislocation/subluxation,  thromboembolic complications and other imponderables were discussed.  The patient acknowledged the explanation, agreed to proceed with the plan and consent was signed. Patient is being admitted for inpatient treatment for surgery, pain control, PT, OT, prophylactic  antibiotics, VTE prophylaxis, progressive ambulation and ADL's and discharge planning.The patient is planning to be discharged home with home health services

## 2017-09-18 NOTE — Anesthesia Postprocedure Evaluation (Signed)
Anesthesia Post Note  Patient: Brandon Sanford  Procedure(s) Performed: LEFT TOTAL HIP ARTHROPLASTY ANTERIOR APPROACH (Left Hip)     Patient location during evaluation: PACU Anesthesia Type: Spinal Level of consciousness: awake and alert Pain management: pain level controlled Vital Signs Assessment: post-procedure vital signs reviewed and stable Respiratory status: spontaneous breathing and respiratory function stable Cardiovascular status: blood pressure returned to baseline and stable Postop Assessment: spinal receding Anesthetic complications: no    Last Vitals:  Vitals:   09/18/17 1442 09/18/17 1938  BP: 112/65 118/73  Pulse: (!) 52 93  Resp: 16 16  Temp: (!) 36.4 C 36.8 C  SpO2: 94% 92%    Last Pain:  Vitals:   09/18/17 1938  TempSrc: Oral  PainSc:                  Milly Goggins DANIEL

## 2017-09-18 NOTE — Plan of Care (Signed)

## 2017-09-18 NOTE — Transfer of Care (Signed)
Immediate Anesthesia Transfer of Care Note  Patient: Brandon Sanford  Procedure(s) Performed: LEFT TOTAL HIP ARTHROPLASTY ANTERIOR APPROACH (Left Hip)  Patient Location: PACU  Anesthesia Type:MAC and Spinal  Level of Consciousness: awake, alert  and oriented  Airway & Oxygen Therapy: Patient Spontanous Breathing  Post-op Assessment: Report given to RN and Post -op Vital signs reviewed and stable  Post vital signs: Reviewed and stable  Last Vitals:  Vitals Value Taken Time  BP 100/77 09/18/2017  1:11 PM  Temp    Pulse 78 09/18/2017  1:12 PM  Resp 13 09/18/2017  1:12 PM  SpO2 94 % 09/18/2017  1:12 PM  Vitals shown include unvalidated device data.  Last Pain:  Vitals:   09/18/17 0942  TempSrc:   PainSc: 6       Patients Stated Pain Goal: 2 (95/18/84 1660)  Complications: No apparent anesthesia complications

## 2017-09-19 ENCOUNTER — Encounter (HOSPITAL_COMMUNITY): Payer: Self-pay | Admitting: Orthopaedic Surgery

## 2017-09-19 LAB — BASIC METABOLIC PANEL
ANION GAP: 4 — AB (ref 5–15)
BUN: 24 mg/dL — ABNORMAL HIGH (ref 8–23)
CO2: 28 mmol/L (ref 22–32)
Calcium: 8.8 mg/dL — ABNORMAL LOW (ref 8.9–10.3)
Chloride: 104 mmol/L (ref 98–111)
Creatinine, Ser: 1.39 mg/dL — ABNORMAL HIGH (ref 0.61–1.24)
GFR calc Af Amer: 55 mL/min — ABNORMAL LOW (ref >60)
GFR calc non Af Amer: 48 mL/min — ABNORMAL LOW (ref >60)
GLUCOSE: 128 mg/dL — AB (ref 70–99)
POTASSIUM: 4.8 mmol/L (ref 3.5–5.1)
Sodium: 136 mmol/L (ref 135–145)

## 2017-09-19 LAB — CBC
HEMATOCRIT: 38 % — AB (ref 39.0–52.0)
Hemoglobin: 12 g/dL — ABNORMAL LOW (ref 13.0–17.0)
MCH: 28.2 pg (ref 26.0–34.0)
MCHC: 31.6 g/dL (ref 30.0–36.0)
MCV: 89.4 fL (ref 78.0–100.0)
Platelets: 200 10*3/uL (ref 150–400)
RBC: 4.25 MIL/uL (ref 4.22–5.81)
RDW: 13.2 % (ref 11.5–15.5)
WBC: 8.6 10*3/uL (ref 4.0–10.5)

## 2017-09-19 MED ORDER — ASPIRIN 81 MG PO CHEW: 81.0000 mg | CHEWABLE_TABLET | Freq: Two times a day (BID) | ORAL | 0 refills | Status: DC

## 2017-09-19 MED ORDER — OXYCODONE HCL 5 MG PO TABS: 5.0000 mg | ORAL_TABLET | ORAL | 0 refills | Status: DC | PRN

## 2017-09-19 MED ORDER — ATORVASTATIN CALCIUM 20 MG PO TABS
20.0000 mg | ORAL_TABLET | Freq: Every day | ORAL | Status: DC
  Administered 2017-09-19: 20 mg via ORAL
  Filled 2017-09-19: qty 1

## 2017-09-19 MED ORDER — METHOCARBAMOL 500 MG PO TABS: 500.0000 mg | ORAL_TABLET | Freq: Four times a day (QID) | ORAL | 0 refills | Status: DC | PRN

## 2017-09-19 NOTE — Evaluation (Addendum)
Occupational Therapy Evaluation Patient Details Name: Brandon Sanford MRN: [id number] DOB: 11-03-1940 Today's Date: 09/19/2017    History of Present Illness Pt is a 77 y/o male s/p L THA, direct anterior approach. PMH includes arthritis, skin cancer, HTN, and plantar fasciitis.    Clinical Impression   This 77 y/o male presents with the above. At baseline pt is independent with ADLs and functional mobility. Pt performing room level functional mobility using RW with overall minA this session and with min cues for safe RW use during mobility. He currently requires minguard assist for seated UB ADL, minA for standing grooming ADLs, modA for LB ADLs. Pt reports he will return home with spouse who is able to assist with ADLs PRN. Will benefit from continued acute OT services prior to discharge to maximize his overall safety and independence with ADLs and mobility. Will follow.     Follow Up Recommendations  Follow surgeon's recommendation for DC plan and follow-up therapies;Supervision/Assistance - 24 hour    Equipment Recommendations  3 in 1 bedside commode           Precautions / Restrictions Precautions Precautions: None Precaution Comments: Reviewed supine HEP, however, did not give handout.  Restrictions Weight Bearing Restrictions: Yes LLE Weight Bearing: Weight bearing as tolerated      Mobility Bed Mobility Overal bed mobility: Needs Assistance Bed Mobility: Supine to Sit;Sit to Supine     Supine to sit: Min assist Sit to supine: Min assist   General bed mobility comments: for RLE management   Transfers Overall transfer level: Needs assistance Equipment used: Rolling walker (2 wheeled) Transfers: Sit to/from Stand Sit to Stand: Min assist         General transfer comment: Min A for steadying assist. Verbal cues for safe hand placement.     Balance Overall balance assessment: Needs assistance Sitting-balance support: No upper extremity supported;Feet  supported Sitting balance-Leahy Scale: Fair     Standing balance support: Bilateral upper extremity supported;During functional activity Standing balance-Leahy Scale: Poor Standing balance comment: Reliant on BUE support.                            ADL either performed or assessed with clinical judgement   ADL Overall ADL's : Needs assistance/impaired Eating/Feeding: Independent;Sitting   Grooming: Min guard;Minimal assistance;Standing;Wash/dry hands   Upper Body Bathing: Min guard;Sitting   Lower Body Bathing: Minimal assistance;Sit to/from stand   Upper Body Dressing : Set up;Sitting   Lower Body Dressing: Minimal assistance;Sit to/from stand   Toilet Transfer: Minimal assistance;Ambulation;Regular Toilet;Grab bars;RW Armed forces technical officer Details (indicate cue type and reason): simulated in transfer to/from EOB  Toileting- Clothing Manipulation and Hygiene: Min guard;Minimal assistance;Sit to/from stand Toileting - Clothing Manipulation Details (indicate cue type and reason): pt standing to void bladder, use of grab bars for static standing balance; educated on RW placement during standing toileting task      Functional mobility during ADLs: Minimal assistance;Rolling walker General ADL Comments: educated on safe RW placement during functional tasks as pt tending to leave RW to the side during standing functional activity; requires cues for maintaining proximity to RW                         Pertinent Vitals/Pain Pain Assessment: Faces Pain Score: 6  Faces Pain Scale: Hurts even more Pain Location: L hip  Pain Descriptors / Indicators: Grimacing;Operative site guarding;Aching Pain Intervention(s): Monitored during session;Limited  activity within patient's tolerance;Repositioned;Ice applied     Hand Dominance     Extremity/Trunk Assessment Upper Extremity Assessment Upper Extremity Assessment: Overall WFL for tasks assessed   Lower Extremity  Assessment Lower Extremity Assessment: Defer to PT evaluation LLE Deficits / Details: Deficits consistent with post op pain and weakness. Able to perform ther ex below.    Cervical / Trunk Assessment Cervical / Trunk Assessment: Normal   Communication Communication Communication: No difficulties   Cognition Arousal/Alertness: Awake/alert Behavior During Therapy: WFL for tasks assessed/performed Overall Cognitive Status: Within Functional Limits for tasks assessed                                     General Comments       Exercises Exercises: Total Joint Total Joint Exercises Ankle Circles/Pumps: AROM;Both;10 reps Quad Sets: AROM;Left;10 reps   Shoulder Instructions      Home Living Family/patient expects to be discharged to:: Private residence Living Arrangements: Spouse/significant other Available Help at Discharge: Family;Available 24 hours/day Type of Home: House Home Access: Stairs to enter [employer] of Steps: 2-3 Entrance Stairs-Rails: Right Home Layout: One level     Bathroom Shower/Tub: Teacher, early years/pre: Standard     Home Equipment: Cane - single point          Prior Functioning/Environment Level of Independence: Independent with assistive device(s)        Comments: Reports occasional use of cane for ambulation         OT Problem List: Decreased strength;Impaired balance (sitting and/or standing);Pain;Decreased range of motion;Decreased activity tolerance;Decreased knowledge of use of DME or AE      OT Treatment/Interventions: Self-care/ADL training;DME and/or AE instruction;Therapeutic activities;Balance training;Therapeutic exercise;Patient/family education    OT Goals(Current goals can be found in the care plan section) Acute Rehab OT Goals Patient Stated Goal: to decrease pain  OT Goal Formulation: With patient Time For Goal Achievement: 10/03/17 Potential to Achieve Goals: Good  OT Frequency:  Min 2X/week   Barriers to D/C:            Co-evaluation              AM-PAC PT "6 Clicks" Daily Activity     Outcome Measure Help from another person eating meals?: None Help from another person taking care of personal grooming?: A Little Help from another person toileting, which includes using toliet, bedpan, or urinal?: A Little Help from another person bathing (including washing, rinsing, drying)?: A Little Help from another person to put on and taking off regular upper body clothing?: None Help from another person to put on and taking off regular lower body clothing?: A Lot 6 Click Score: 19   End of Session Equipment Utilized During Treatment: Gait belt;Rolling walker Nurse Communication: Mobility status  Activity Tolerance: Patient tolerated treatment well Patient left: in bed;with call bell/phone within reach;with bed alarm set;with family/visitor present  OT Visit Diagnosis: Unsteadiness on feet (R26.81);Other abnormalities of gait and mobility (R26.89)                Time: 1045-1101 OT Time Calculation (min): 16 min Charges:  OT General Charges $OT Visit: 1 Visit OT Evaluation $OT Eval Low Complexity: 1 Low  Lou Cal, OT Pager [phone] 09/19/2017   Raymondo Band 09/19/2017, 1:01 PM

## 2017-09-19 NOTE — Progress Notes (Signed)
Physical Therapy Treatment Patient Details Name: Brandon Sanford MRN: [id number] DOB: 05-07-1940 Today's Date: 09/19/2017    History of Present Illness Pt is a 77 y/o male s/p L THA, direct anterior approach. PMH includes arthritis, skin cancer, HTN, and plantar fasciitis.     PT Comments    Patient received in bed, pleasant and willing to participate in PT session this morning. He requires MinA for bed mobility due to pain, however was able to complete functional transfers and gait with RW and min guard, VC provided for safety and sequencing during session. Able to maintain SpO2 between 93-94% on room air during activity. He was left up in the chair on room air with all needs met and family present this morning.    Follow Up Recommendations  Follow surgeon's recommendation for DC plan and follow-up therapies;Supervision for mobility/OOB     Equipment Recommendations  Rolling walker with 5" wheels;3in1 (PT)    Recommendations for Other Services       Precautions / Restrictions Precautions Precautions: None Restrictions Weight Bearing Restrictions: Yes LLE Weight Bearing: Weight bearing as tolerated    Mobility  Bed Mobility Overal bed mobility: Needs Assistance Bed Mobility: Supine to Sit     Supine to sit: Min assist     General bed mobility comments: MinA for LE management due to pain   Transfers Overall transfer level: Needs assistance Equipment used: Rolling walker (2 wheeled) Transfers: Sit to/from Stand Sit to Stand: Min guard         General transfer comment: Min guard for safety, no physical assist given, VC for safety and sequencing   Ambulation/Gait Ambulation/Gait assistance: Min guard Gait Distance (Feet): 90 Feet Assistive device: Rolling walker (2 wheeled) Gait Pattern/deviations: Step-through pattern;Decreased step length - right;Decreased stance time - left;Decreased stride length;Decreased dorsiflexion - left;Decreased weight shift to left;Trunk  flexed     General Gait Details: cues for gait mechanics including increased step/stride length, upright posture, heel toe sequencing with RW    Stairs             Wheelchair Mobility    Modified Rankin (Stroke Patients Only)       Balance Overall balance assessment: Needs assistance Sitting-balance support: No upper extremity supported;Feet supported Sitting balance-Leahy Scale: Good     Standing balance support: Bilateral upper extremity supported;During functional activity Standing balance-Leahy Scale: Fair Standing balance comment: Reliant on BUE support.                             Cognition Arousal/Alertness: Awake/alert Behavior During Therapy: WFL for tasks assessed/performed Overall Cognitive Status: Within Functional Limits for tasks assessed                                        Exercises      General Comments        Pertinent Vitals/Pain Pain Assessment: 0-10 Pain Score: 6  Pain Location: L hip  Pain Descriptors / Indicators: Grimacing;Operative site guarding;Aching Pain Intervention(s): Limited activity within patient's tolerance;Monitored during session;Premedicated before session;Repositioned    Home Living                      Prior Function            PT Goals (current goals can now be found in the care plan section) Acute  Rehab PT Goals Patient Stated Goal: to decrease pain  PT Goal Formulation: With patient Time For Goal Achievement: 10/02/17 Potential to Achieve Goals: Good Progress towards PT goals: Progressing toward goals    Frequency    7X/week      PT Plan Current plan remains appropriate    Co-evaluation              AM-PAC PT "6 Clicks" Daily Activity  Outcome Measure  Difficulty turning over in bed (including adjusting bedclothes, sheets and blankets)?: A Little Difficulty moving from lying on back to sitting on the side of the bed? : A Little Difficulty sitting  down on and standing up from a chair with arms (e.g., wheelchair, bedside commode, etc,.)?: A Little Help needed moving to and from a bed to chair (including a wheelchair)?: A Little Help needed walking in hospital room?: A Little Help needed climbing 3-5 steps with a railing? : A Lot 6 Click Score: 17    End of Session Equipment Utilized During Treatment: Gait belt Activity Tolerance: Patient tolerated treatment well Patient left: in chair;with call bell/phone within reach;with family/visitor present   PT Visit Diagnosis: Unsteadiness on feet (R26.81);Muscle weakness (generalized) (M62.81);Pain Pain - Right/Left: Left Pain - part of body: Hip     Time: 3448-3015 PT Time Calculation (min) (ACUTE ONLY): 23 min  Charges:  $Gait Training: 8-22 mins $Therapeutic Activity: 8-22 mins                     Deniece Ree PT, DPT, CBIS  Supplemental Physical Therapist [place]   Pager [phone]

## 2017-09-19 NOTE — Care Management Note (Signed)
Case Management Note  Patient Details  Name: Brandon Sanford MRN: [id number] Date of Birth: Aug 25, 1940  Subjective/Objective:   77 yr old gentleman s/p left total hip arthroplasty.                 Action/Plan: Case manager spoke with patient concerning discharge plan and DME. Patient was preoperatively setup with [facility], no changes. Patient will have family support at discharge. DME to be delivered to room by [facility].   Expected Discharge Date:    09/20/17              Expected Discharge Plan:  [place]  In-House Referral:  NA  Discharge planning Services  CM Consult  Post Acute Care Choice:  Durable Medical Equipment, Home Health Choice offered to:  Patient  DME Arranged:  3-N-1, Walker rolling DME Agency:  [place]:  PT [place] Agency:  [facility] (formerly [facility]  Status of Service:  Completed, signed off  If discussed at [employer], dates discussed:    Additional Comments:  Ninfa Meeker, RN 09/19/2017, 1:00 PM

## 2017-09-19 NOTE — Plan of Care (Signed)

## 2017-09-19 NOTE — Progress Notes (Signed)
Physical Therapy Treatment Patient Details Name: Brandon Sanford MRN: [id number] DOB: 07-28-1940 Today's Date: 09/19/2017    History of Present Illness Pt is a 77 y/o male s/p L THA, direct anterior approach. PMH includes arthritis, skin cancer, HTN, and plantar fasciitis.     PT Comments    Patient received in bed, pleasant and willing to participate in PT this afternoon. Able to complete functional bed mobility with Min guard and extended time, and able to perform functional transfers and gait with RW and general S, VC for safety and sequencing today. Introduced stairs both forwards with U railing and backwards with RW and MinA to stabilize walker during stair navigation; continued gait training with patient able to ambulate approximately 165f with RW and one seated rest break. Reviewed and practiced total hip HEP with patient able to perform all exercises with good form and voiced understanding. He was left up in the chair with all needs met this afternoon.     Follow Up Recommendations  Follow surgeon's recommendation for DC plan and follow-up therapies;Supervision for mobility/OOB     Equipment Recommendations  Rolling walker with 5" wheels    Recommendations for Other Services       Precautions / Restrictions Precautions Precautions: None Precaution Comments: Reviewed supine HEP, however, did not give handout.  Restrictions Weight Bearing Restrictions: Yes LLE Weight Bearing: Weight bearing as tolerated    Mobility  Bed Mobility Overal bed mobility: Needs Assistance Bed Mobility: Supine to Sit     Supine to sit: Min guard Sit to supine: Min assist   General bed mobility comments: extended time and effort   Transfers Overall transfer level: Needs assistance Equipment used: Rolling walker (2 wheeled) Transfers: Sit to/from Stand Sit to Stand: Supervision         General transfer comment: extended time, VC for safety and sequencing    Ambulation/Gait Ambulation/Gait assistance: Supervision Gait Distance (Feet): 100 Feet Assistive device: Rolling walker (2 wheeled) Gait Pattern/deviations: Step-through pattern;Decreased step length - right;Decreased stance time - left;Decreased stride length;Decreased dorsiflexion - left;Decreased weight shift to left;Trunk flexed     General Gait Details: cues for gait mechanics including increased step/stride length, upright posture, heel toe sequencing with RW; one seated rest break during gait this afternoon    Stairs Stairs: Yes Stairs assistance: Min guard;Min assist Stair Management: One rail Right;No rails Number of Stairs: 3 General stair comments: able to ascend/descend steps with min guard with U railing on R, VC for correct sequencing;  requires MinA to stabilize RW when ascending/descending stairs backwards with no railings, again VC for correct sequencing    Wheelchair Mobility    Modified Rankin (Stroke Patients Only)       Balance Overall balance assessment: Needs assistance Sitting-balance support: No upper extremity supported;Feet supported Sitting balance-Leahy Scale: Good     Standing balance support: Bilateral upper extremity supported;During functional activity Standing balance-Leahy Scale: Fair Standing balance comment: Reliant on BUE support.                             Cognition Arousal/Alertness: Awake/alert Behavior During Therapy: WFL for tasks assessed/performed Overall Cognitive Status: Within Functional Limits for tasks assessed                                        Exercises Total Joint Exercises Ankle Circles/Pumps: AROM;Both;10  reps Quad Sets: AROM;Left;10 reps    General Comments        Pertinent Vitals/Pain Pain Assessment: Faces Faces Pain Scale: Hurts little more Pain Location: L hip  Pain Descriptors / Indicators: Grimacing;Operative site guarding;Aching Pain Intervention(s): Limited  activity within patient's tolerance;Monitored during session    [place] expects to be discharged to:: Private residence Living Arrangements: Spouse/significant other Available Help at Discharge: Family;Available 24 hours/day Type of Home: House Home Access: Stairs to enter Entrance Stairs-Rails: Right Home Layout: One level Home Equipment: Cane - single point      Prior Function Level of Independence: Independent with assistive device(s)      Comments: Reports occasional use of cane for ambulation    PT Goals (current goals can now be found in the care plan section) Acute Rehab PT Goals Patient Stated Goal: to decrease pain  PT Goal Formulation: With patient Time For Goal Achievement: 10/02/17 Potential to Achieve Goals: Good Progress towards PT goals: Progressing toward goals    Frequency    7X/week      PT Plan Current plan remains appropriate    Co-evaluation              AM-PAC PT "6 Clicks" Daily Activity  Outcome Measure  Difficulty turning over in bed (including adjusting bedclothes, sheets and blankets)?: A Little Difficulty moving from lying on back to sitting on the side of the bed? : A Little Difficulty sitting down on and standing up from a chair with arms (e.g., wheelchair, bedside commode, etc,.)?: A Little Help needed moving to and from a bed to chair (including a wheelchair)?: A Little Help needed walking in hospital room?: A Little Help needed climbing 3-5 steps with a railing? : A Little 6 Click Score: 18    End of Session   Activity Tolerance: Patient tolerated treatment well Patient left: in chair;with call bell/phone within reach   PT Visit Diagnosis: Unsteadiness on feet (R26.81);Muscle weakness (generalized) (M62.81);Pain Pain - Right/Left: Left Pain - part of body: Hip     Time: 8676-7209 PT Time Calculation (min) (ACUTE ONLY): 33 min  Charges:  $Gait Training: 23-37 mins                     Deniece Ree PT, DPT, CBIS  Supplemental Physical Therapist [place]   Pager [phone]

## 2017-09-19 NOTE — Progress Notes (Signed)
Patient ID: Brandon Sanford, male   DOB: 1940/06/19, 77 y.o.   MRN: [id number] Doing very well this afternoon.  Can be discharged to home tomorrow.

## 2017-09-19 NOTE — Progress Notes (Signed)
Subjective: 1 Day Post-Op Procedure(s) (LRB): LEFT TOTAL HIP ARTHROPLASTY ANTERIOR APPROACH (Left) Patient reports pain as moderate.    Objective: Vital signs in last 24 hours: Temp:  [97.3 F (36.3 C)-98.4 F (36.9 C)] 98.4 F (36.9 C) (08/30 0627) Pulse Rate:  [52-93] 64 (08/30 0627) Resp:  [11-18] 15 (08/30 0627) BP: (105-174)/(65-85) 119/69 (08/30 0627) SpO2:  [92 %-100 %] 100 % (08/30 0627) Weight:  [82.6 kg] 82.6 kg (08/29 0935)  Intake/Output from previous day: 08/29 0701 - 08/30 0700 In: 740 [P.O.:240; I.V.:500] Out: 900 [Urine:600; Blood:300] Intake/Output this shift: Total I/O In: 240 [P.O.:240] Out: -   Recent Labs    09/19/17 0445  HGB 12.0*   Recent Labs    09/19/17 0445  WBC 8.6  RBC 4.25  HCT 38.0*  PLT 200   Recent Labs    09/19/17 0445  NA 136  K 4.8  CL 104  CO2 28  BUN 24*  CREATININE 1.39*  GLUCOSE 128*  CALCIUM 8.8*   No results for input(s): LABPT, INR in the last 72 hours.  Sensation intact distally Intact pulses distally Dorsiflexion/Plantar flexion intact Incision: dressing C/D/I    Assessment/Plan: 1 Day Post-Op Procedure(s) (LRB): LEFT TOTAL HIP ARTHROPLASTY ANTERIOR APPROACH (Left) Up with therapy Discharge home with home health next 1-2 days.    Mcarthur Rossetti 09/19/2017, 6:47 AM

## 2017-09-20 NOTE — Progress Notes (Addendum)
     Subjective: 2 Days Post-Op Procedure(s) (LRB): LEFT TOTAL HIP ARTHROPLASTY ANTERIOR APPROACH (Left) Awake, alert and oriented x 4. I feel like I can go home. No BM, told to use softners and drink plenty of liquids.  Patient reports pain as moderate.    Objective:   VITALS:  Temp:  [98.5 F (36.9 C)-100 F (37.8 C)] 99.4 F (37.4 C) (08/31 0420) Pulse Rate:  [71-90] 71 (08/31 0420) Resp:  [16] 16 (08/30 1232) BP: (130-133)/(70-71) 133/70 (08/31 0420) SpO2:  [96 %-98 %] 96 % (08/31 0420)  Neurologically intact ABD soft Neurovascular intact Sensation intact distally Intact pulses distally Dorsiflexion/Plantar flexion intact Incision: dressing C/D/I and no drainage   LABS Recent Labs    09/19/17 0445  HGB 12.0*  WBC 8.6  PLT 200   Recent Labs    09/19/17 0445  NA 136  K 4.8  CL 104  CO2 28  BUN 24*  CREATININE 1.39*  GLUCOSE 128*   No results for input(s): LABPT, INR in the last 72 hours.   Assessment/Plan: 2 Days Post-Op Procedure(s) (LRB): LEFT TOTAL HIP ARTHROPLASTY ANTERIOR APPROACH (Left)  Advance diet Up with therapy Discharge home with home health  Rxs on the chart requests names of stool softners on discharge instructions. Unable to send rx for narcotics via fax or phone.   Basil Dess 09/20/2017, 9:17 AMPatient ID: Brandon Sanford, male   DOB: Aug 24, 1940, 77 y.o.   MRN: [id number]

## 2017-09-20 NOTE — Plan of Care (Signed)
  Problem: Education: Goal: Knowledge of General Education information will improve Description: Including pain rating scale, medication(s)/side effects and non-pharmacologic comfort measures Outcome: Progressing   Problem: Clinical Measurements: Goal: Ability to maintain clinical measurements within normal limits will improve Outcome: Progressing   Problem: Safety: Goal: Ability to remain free from injury will improve Outcome: Progressing   

## 2017-09-20 NOTE — Progress Notes (Signed)
Patient discharging home today. Discharge instructions explained to patient and he verbalized understanding. Took all personal belongings. No further questions or concerns voiced.  

## 2017-09-20 NOTE — Progress Notes (Signed)
Physical Therapy Treatment Patient Details Name: Brandon Sanford MRN: [id number] DOB: 06-13-1940 Today's Date: 09/20/2017    History of Present Illness Pt is a 77 y/o male s/p L THA, direct anterior approach. PMH includes arthritis, skin cancer, HTN, and plantar fasciitis.     PT Comments    Pt making steady progress with functional mobility, but remains somewhat limited secondary to fatigue and pain.  Pt would continue to benefit from skilled physical therapy services at this time while admitted and after d/c to address the below listed limitations in order to improve overall safety and independence with functional mobility.   Follow Up Recommendations  Home health PT     Equipment Recommendations  Rolling walker with 5" wheels    Recommendations for Other Services       Precautions / Restrictions Precautions Precautions: None Restrictions Weight Bearing Restrictions: Yes LLE Weight Bearing: Weight bearing as tolerated    Mobility  Bed Mobility Overal bed mobility: Needs Assistance Bed Mobility: Supine to Sit     Supine to sit: Supervision     General bed mobility comments: increased time and effort  Transfers Overall transfer level: Needs assistance Equipment used: Rolling walker (2 wheeled) Transfers: Sit to/from Stand Sit to Stand: Supervision         General transfer comment: good technique, increased time  Ambulation/Gait Ambulation/Gait assistance: Supervision Gait Distance (Feet): 100 Feet Assistive device: Rolling walker (2 wheeled) Gait Pattern/deviations: Step-through pattern;Decreased stride length Gait velocity: Decreased  Gait velocity interpretation: <1.31 ft/sec, indicative of household ambulator General Gait Details: pt steady with RW, heavy reliance on bilateral UEs on RW   Stairs             Wheelchair Mobility    Modified Rankin (Stroke Patients Only)       Balance Overall balance assessment: Needs  assistance Sitting-balance support: No upper extremity supported;Feet supported Sitting balance-Leahy Scale: Good     Standing balance support: Bilateral upper extremity supported;During functional activity Standing balance-Leahy Scale: Poor                              Cognition Arousal/Alertness: Awake/alert Behavior During Therapy: WFL for tasks assessed/performed Overall Cognitive Status: Within Functional Limits for tasks assessed                                        Exercises      General Comments        Pertinent Vitals/Pain Pain Assessment: Faces Faces Pain Scale: Hurts a little bit Pain Location: L hip  Pain Descriptors / Indicators: Sore Pain Intervention(s): Monitored during session;Repositioned    Home Living                      Prior Function            PT Goals (current goals can now be found in the care plan section) Acute Rehab PT Goals PT Goal Formulation: With patient Time For Goal Achievement: 10/02/17 Potential to Achieve Goals: Good Progress towards PT goals: Progressing toward goals    Frequency    7X/week      PT Plan Current plan remains appropriate    Co-evaluation              AM-PAC PT "6 Clicks" Daily Activity  Outcome Measure  Difficulty turning  over in bed (including adjusting bedclothes, sheets and blankets)?: None Difficulty moving from lying on back to sitting on the side of the bed? : None Difficulty sitting down on and standing up from a chair with arms (e.g., wheelchair, bedside commode, etc,.)?: Unable Help needed moving to and from a bed to chair (including a wheelchair)?: None Help needed walking in hospital room?: None Help needed climbing 3-5 steps with a railing? : A Little 6 Click Score: 20    End of Session   Activity Tolerance: Patient tolerated treatment well Patient left: in chair;with call bell/phone within reach;with family/visitor present Nurse  Communication: Mobility status PT Visit Diagnosis: Unsteadiness on feet (R26.81);Muscle weakness (generalized) (M62.81);Pain Pain - Right/Left: Left Pain - part of body: Hip     Time: 0388-8280 PT Time Calculation (min) (ACUTE ONLY): 14 min  Charges:  $Gait Training: 8-22 mins                     Sherie Don, [place], DPT  Acute Rehabilitation Services Pager [phone] Office [place] 09/20/2017, 10:38 AM

## 2017-09-20 NOTE — Discharge Instructions (Addendum)
INSTRUCTIONS AFTER JOINT REPLACEMENT   o Remove items at home which could result in a fall. This includes throw rugs or furniture in walking pathways o ICE to the affected joint every three hours while awake for 30 minutes at a time, for at least the first 3-5 days, and then as needed for pain and swelling.  Continue to use ice for pain and swelling. You may notice swelling that will progress down to the foot and ankle.  This is normal after surgery.  Elevate your leg when you are not up walking on it.   o Continue to use the breathing machine you got in the hospital (incentive spirometer) which will help keep your temperature down.  It is common for your temperature to cycle up and down following surgery, especially at night when you are not up moving around and exerting yourself.  The breathing machine keeps your lungs expanded and your temperature down.   DIET:  As you were doing prior to hospitalization, we recommend a well-balanced diet. Stool softners and laxatives are recommended in order to stay regular with BMs, senakot tablets, colace or miralax can be used. A fleets enema if no movement within 3-4 days of surgery is occasionally needed. Drink plenty of liquids, at least 5 glasses of water per day.   DRESSING / WOUND CARE / SHOWERING  Keep the surgical dressing until follow up.  The dressing is water proof, so you can shower without any extra covering.  IF THE DRESSING FALLS OFF or the wound gets wet inside, change the dressing with sterile gauze.  Please use good hand washing techniques before changing the dressing.  Do not use any lotions or creams on the incision until instructed by your surgeon.    ACTIVITY  o Increase activity slowly as tolerated, but follow the weight bearing instructions below.   o No driving for 6 weeks or until further direction given by your physician.  You cannot drive while taking narcotics.  o No lifting or carrying greater than 10 lbs. until further directed  by your surgeon. o Avoid periods of inactivity such as sitting longer than an hour when not asleep. This helps prevent blood clots.  o You may return to work once you are authorized by your doctor.     WEIGHT BEARING   Weight bearing as tolerated with assist device (walker, cane, etc) as directed, use it as long as suggested by your surgeon or therapist, typically at least 4-6 weeks.   EXERCISES  Results after joint replacement surgery are often greatly improved when you follow the exercise, range of motion and muscle strengthening exercises prescribed by your doctor. Safety measures are also important to protect the joint from further injury. Any time any of these exercises cause you to have increased pain or swelling, decrease what you are doing until you are comfortable again and then slowly increase them. If you have problems or questions, call your caregiver or physical therapist for advice.   Rehabilitation is important following a joint replacement. After just a few days of immobilization, the muscles of the leg can become weakened and shrink (atrophy).  These exercises are designed to build up the tone and strength of the thigh and leg muscles and to improve motion. Often times heat used for twenty to thirty minutes before working out will loosen up your tissues and help with improving the range of motion but do not use heat for the first two weeks following surgery (sometimes heat can increase  post-operative swelling).   These exercises can be done on a training (exercise) mat, on the floor, on a table or on a bed. Use whatever works the best and is most comfortable for you.    Use music or television while you are exercising so that the exercises are a pleasant break in your day. This will make your life better with the exercises acting as a break in your routine that you can look forward to.   Perform all exercises about fifteen times, three times per day or as directed.  You should  exercise both the operative leg and the other leg as well.  Exercises include:    Quad Sets - Tighten up the muscle on the front of the thigh (Quad) and hold for 5-10 seconds.    Straight Leg Raises - With your knee straight (if you were given a brace, keep it on), lift the leg to 60 degrees, hold for 3 seconds, and slowly lower the leg.  Perform this exercise against resistance later as your leg gets stronger.   Leg Slides: Lying on your back, slowly slide your foot toward your buttocks, bending your knee up off the floor (only go as far as is comfortable). Then slowly slide your foot back down until your leg is flat on the floor again.   Angel Wings: Lying on your back spread your legs to the side as far apart as you can without causing discomfort.   Hamstring Strength:  Lying on your back, push your heel against the floor with your leg straight by tightening up the muscles of your buttocks.  Repeat, but this time bend your knee to a comfortable angle, and push your heel against the floor.  You may put a pillow under the heel to make it more comfortable if necessary.   A rehabilitation program following joint replacement surgery can speed recovery and prevent re-injury in the future due to weakened muscles. Contact your doctor or a physical therapist for more information on knee rehabilitation.    CONSTIPATION  Constipation is defined medically as fewer than three stools per week and severe constipation as less than one stool per week.  Even if you have a regular bowel pattern at home, your normal regimen is likely to be disrupted due to multiple reasons following surgery.  Combination of anesthesia, postoperative narcotics, change in appetite and fluid intake all can affect your bowels.   YOU MUST use at least one of the following options; they are listed in order of increasing strength to get the job done.  They are all available over the counter, and you may need to use some, POSSIBLY even  all of these options:    Drink plenty of fluids (prune juice may be helpful) and high fiber foods Colace 100 mg by mouth twice a day  Senokot for constipation as directed and as needed Dulcolax (bisacodyl), take with full glass of water  Miralax (polyethylene glycol) once or twice a day as needed.  If you have tried all these things and are unable to have a bowel movement in the first 3-4 days after surgery call either your surgeon or your primary doctor.    If you experience loose stools or diarrhea, hold the medications until you stool forms back up.  If your symptoms do not get better within 1 week or if they get worse, check with your doctor.  If you experience "the worst abdominal pain ever" or develop nausea or vomiting, please contact the  office immediately for further recommendations for treatment.   ITCHING:  If you experience itching with your medications, try taking only a single pain pill, or even half a pain pill at a time.  You can also use Benadryl over the counter for itching or also to help with sleep.   TED HOSE STOCKINGS:  Use stockings on both legs until for at least 2 weeks or as directed by physician office. They may be removed at night for sleeping.  MEDICATIONS:  See your medication summary on the After Visit Summary that nursing will review with you.  You may have some home medications which will be placed on hold until you complete the course of blood thinner medication.  It is important for you to complete the blood thinner medication as prescribed.  PRECAUTIONS:  If you experience chest pain or shortness of breath - call 911 immediately for transfer to the hospital emergency department.   If you develop a fever greater that 101 F, purulent drainage from wound, increased redness or drainage from wound, foul odor from the wound/dressing, or calf pain - CONTACT YOUR SURGEON.                                                   FOLLOW-UP APPOINTMENTS:  If you do not  already have a post-op appointment, please call the office for an appointment to be seen by your surgeon.  Guidelines for how soon to be seen are listed in your After Visit Summary, but are typically between 1-4 weeks after surgery.  OTHER INSTRUCTIONS:   Knee Replacement:  Do not place pillow under knee, focus on keeping the knee straight while resting. CPM instructions: 0-90 degrees, 2 hours in the morning, 2 hours in the afternoon, and 2 hours in the evening. Place foam block, curve side up under heel at all times except when in CPM or when walking.  DO NOT modify, tear, cut, or change the foam block in any way.  MAKE SURE YOU:   Understand these instructions.   Get help right away if you are not doing well or get worse.    Thank you for letting us be a part of your medical care team.  It is a privilege we respect greatly.  We hope these instructions will help you stay on track for a fast and full recovery!

## 2017-09-23 ENCOUNTER — Inpatient Hospital Stay (INDEPENDENT_AMBULATORY_CARE_PROVIDER_SITE_OTHER): Payer: Self-pay | Admitting: Orthopaedic Surgery

## 2017-09-23 DIAGNOSIS — J449 Chronic obstructive pulmonary disease, unspecified: Secondary | ICD-10-CM | POA: Diagnosis not present

## 2017-09-23 DIAGNOSIS — Z7982 Long term (current) use of aspirin: Secondary | ICD-10-CM | POA: Diagnosis not present

## 2017-09-23 DIAGNOSIS — Z7951 Long term (current) use of inhaled steroids: Secondary | ICD-10-CM | POA: Diagnosis not present

## 2017-09-23 DIAGNOSIS — Z87891 Personal history of nicotine dependence: Secondary | ICD-10-CM | POA: Diagnosis not present

## 2017-09-23 DIAGNOSIS — F329 Major depressive disorder, single episode, unspecified: Secondary | ICD-10-CM | POA: Diagnosis not present

## 2017-09-23 DIAGNOSIS — Z96642 Presence of left artificial hip joint: Secondary | ICD-10-CM | POA: Diagnosis not present

## 2017-09-23 DIAGNOSIS — Z85828 Personal history of other malignant neoplasm of skin: Secondary | ICD-10-CM | POA: Diagnosis not present

## 2017-09-23 DIAGNOSIS — I10 Essential (primary) hypertension: Secondary | ICD-10-CM | POA: Diagnosis not present

## 2017-09-23 DIAGNOSIS — Z471 Aftercare following joint replacement surgery: Secondary | ICD-10-CM | POA: Diagnosis not present

## 2017-09-24 ENCOUNTER — Telehealth (INDEPENDENT_AMBULATORY_CARE_PROVIDER_SITE_OTHER): Payer: Self-pay

## 2017-09-24 NOTE — Telephone Encounter (Signed)
Verbal order given  

## 2017-09-24 NOTE — Telephone Encounter (Signed)
Mark with [facility] would like verbal orders for HHPT, 3 x a week for 1 week, and 2 x a week for 1 week, starting on 09/23/2017.  Cb# is [phone].  Please advise.  Thank You.

## 2017-09-25 DIAGNOSIS — F329 Major depressive disorder, single episode, unspecified: Secondary | ICD-10-CM | POA: Diagnosis not present

## 2017-09-25 DIAGNOSIS — I10 Essential (primary) hypertension: Secondary | ICD-10-CM | POA: Diagnosis not present

## 2017-09-25 DIAGNOSIS — Z87891 Personal history of nicotine dependence: Secondary | ICD-10-CM | POA: Diagnosis not present

## 2017-09-25 DIAGNOSIS — Z7982 Long term (current) use of aspirin: Secondary | ICD-10-CM | POA: Diagnosis not present

## 2017-09-25 DIAGNOSIS — Z471 Aftercare following joint replacement surgery: Secondary | ICD-10-CM | POA: Diagnosis not present

## 2017-09-25 DIAGNOSIS — J449 Chronic obstructive pulmonary disease, unspecified: Secondary | ICD-10-CM | POA: Diagnosis not present

## 2017-09-25 DIAGNOSIS — Z96642 Presence of left artificial hip joint: Secondary | ICD-10-CM | POA: Diagnosis not present

## 2017-09-25 DIAGNOSIS — Z85828 Personal history of other malignant neoplasm of skin: Secondary | ICD-10-CM | POA: Diagnosis not present

## 2017-09-25 DIAGNOSIS — Z7951 Long term (current) use of inhaled steroids: Secondary | ICD-10-CM | POA: Diagnosis not present

## 2017-09-25 NOTE — Discharge Summary (Signed)
Patient ID: Brandon Sanford MRN: [id number] DOB/AGE: [birth year] 77 y.o.  Admit date: 09/18/2017 Discharge date: 09/25/2017  Admission Diagnoses:  Principal Problem:   Unilateral primary osteoarthritis, left hip Active Problems:   Status post total replacement of left hip   Discharge Diagnoses:  Same  Past Medical History:  Diagnosis Date  . Allergy   . Arthritis   . Asthma    as a child  . Cancer ([place])    skin - neck  "pre- cancer"  . COPD (chronic obstructive pulmonary disease) ([place])   . Depression    pt denies  . Dyspnea    With exertion  . Hyperlipidemia   . Hypertension   . Hypothyroidism   . Inguinal hernia   . Plantar fasciitis   . Skin moles   . Thyroid disease   . Umbilical hernia     Surgeries: Procedure(s): LEFT TOTAL HIP ARTHROPLASTY ANTERIOR APPROACH on 09/18/2017   Consultants:   Discharged Condition: Improved  Hospital Course: Brandon Sanford is an 77 y.o. male who was admitted 09/18/2017 for operative treatment ofUnilateral primary osteoarthritis, left hip. Patient has severe unremitting pain that affects sleep, daily activities, and work/hobbies. After pre-op clearance the patient was taken to the operating room on 09/18/2017 and underwent  Procedure(s): LEFT TOTAL HIP ARTHROPLASTY ANTERIOR APPROACH.    Patient was given perioperative antibiotics:  Anti-infectives (From admission, onward)   Start     Dose/Rate Route Frequency Ordered Stop   09/18/17 1600  ceFAZolin (ANCEF) IVPB 1 g/50 mL premix     1 g 100 mL/hr over 30 Minutes Intravenous Every 6 hours 09/18/17 1425 09/18/17 2207   09/18/17 0915  ceFAZolin (ANCEF) IVPB 2g/100 mL premix     2 g 200 mL/hr over 30 Minutes Intravenous On call to O.R. 09/18/17 0904 09/18/17 1132       Patient was given sequential compression devices, early ambulation, and chemoprophylaxis to prevent DVT.  Patient benefited maximally from hospital stay and there were no complications.    Recent vital signs: No data  found.   Recent laboratory studies: No results for input(s): WBC, HGB, HCT, PLT, NA, K, CL, CO2, BUN, CREATININE, GLUCOSE, INR, CALCIUM in the last 72 hours.  Invalid input(s): PT, 2   Discharge Medications:   Allergies as of 09/20/2017      Reactions   Pneumococcal Vaccines Rash      Medication List    TAKE these medications   aspirin 81 MG chewable tablet Chew 1 tablet (81 mg total) by mouth 2 (two) times daily.   diphenhydrAMINE 25 MG tablet Commonly known as:  SOMINEX Take 50 mg by mouth at bedtime.   levothyroxine 100 MCG tablet Commonly known as:  SYNTHROID, LEVOTHROID Take 100 mcg by mouth daily before breakfast.   methocarbamol 500 MG tablet Commonly known as:  ROBAXIN Take 1 tablet (500 mg total) by mouth every 6 (six) hours as needed for muscle spasms.   multivitamin with minerals Tabs tablet Take 1 tablet by mouth daily.   naproxen sodium 220 MG tablet Commonly known as:  ALEVE Take 440 mg by mouth daily as needed (pain).   NORVASC 5 MG tablet Generic drug:  amLODipine Take 5 mg by mouth daily.   oxyCODONE 5 MG immediate release tablet Commonly known as:  Oxy IR/ROXICODONE Take 1-2 tablets (5-10 mg total) by mouth every 4 (four) hours as needed for moderate pain (pain score 4-6).   simvastatin 40 MG tablet Commonly known as:  ZOCOR  Take 40 mg by mouth every evening.   SYMBICORT 160-4.5 MCG/ACT inhaler Generic drug:  budesonide-formoterol Inhale 2 puffs into the lungs 2 (two) times daily.   SYSTANE 0.4-0.3 % Soln Generic drug:  Polyethyl Glycol-Propyl Glycol Place 1-2 drops into both eyes 3 (three) times daily as needed (dry eyes/irritated eyes).   TUMS 500 MG chewable tablet Generic drug:  calcium carbonate Chew 500 mg by mouth 2 (two) times daily as needed for indigestion or heartburn.       Diagnostic Studies: Dg Pelvis Portable  Result Date: 09/18/2017 CLINICAL DATA:  77 year old male, status post left hip arthroplasty EXAM: PORTABLE  PELVIS 1-2 VIEWS COMPARISON:  CT 05/07/2017 FINDINGS: Visualized bony pelvic ring is intact. Surgical changes of left hip arthroplasty. Gas in the surgical bed. Components are congruent. No fracture identified at the femur. Pelvic phleboliths. IMPRESSION: Early postoperative changes of left hip arthroplasty without complicating features. Electronically Signed   By: Corrie Mckusick D.O.   On: 09/18/2017 15:02   Dg C-arm 1-60 Min  Result Date: 09/18/2017 CLINICAL DATA:  Status post left total hip arthroplasty. EXAM: OPERATIVE left HIP (WITH PELVIS IF PERFORMED) 5 VIEWS TECHNIQUE: Fluoroscopic spot image(s) were submitted for interpretation post-operatively. FLUOROSCOPY TIME:  30 seconds. COMPARISON:  Radiographs of July 29, 2017. FINDINGS: Five intraoperative fluoroscopic images of the left hip demonstrate the femoral and acetabular components to be well situated. No fracture or dislocation is noted. Expected postoperative changes are seen in the surrounding soft tissues. IMPRESSION: Status post left total hip arthroplasty. Electronically Signed   By: Marijo Conception, M.D.   On: 09/18/2017 15:57   Dg Hip Operative Unilat W Or W/o Pelvis Left  Result Date: 09/18/2017 CLINICAL DATA:  Status post left total hip arthroplasty. EXAM: OPERATIVE left HIP (WITH PELVIS IF PERFORMED) 5 VIEWS TECHNIQUE: Fluoroscopic spot image(s) were submitted for interpretation post-operatively. FLUOROSCOPY TIME:  30 seconds. COMPARISON:  Radiographs of July 29, 2017. FINDINGS: Five intraoperative fluoroscopic images of the left hip demonstrate the femoral and acetabular components to be well situated. No fracture or dislocation is noted. Expected postoperative changes are seen in the surrounding soft tissues. IMPRESSION: Status post left total hip arthroplasty. Electronically Signed   By: Marijo Conception, M.D.   On: 09/18/2017 15:57    Disposition:   Discharge Instructions    Call MD / Call 911   Complete by:  As directed    If you  experience chest pain or shortness of breath, CALL 911 and be transported to the hospital emergency room.  If you develope a fever above 101 F, pus (white drainage) or increased drainage or redness at the wound, or calf pain, call your surgeon's office.   Constipation Prevention   Complete by:  As directed    Drink plenty of fluids.  Prune juice may be helpful.  You may use a stool softener, such as Colace (over the counter) 100 mg twice a day.  Use MiraLax (over the counter) for constipation as needed.   Diet - low sodium heart healthy   Complete by:  As directed    Discharge instructions   Complete by:  As directed    INSTRUCTIONS AFTER JOINT REPLACEMENT   Remove items at home which could result in a fall. This includes throw rugs or furniture in walking pathways ICE to the affected joint every three hours while awake for 30 minutes at a time, for at least the first 3-5 days, and then as needed for pain and swelling.  Continue to use ice for pain and swelling. You may notice swelling that will progress down to the foot and ankle.  This is normal after surgery.  Elevate your leg when you are not up walking on it.   Continue to use the breathing machine you got in the hospital (incentive spirometer) which will help keep your temperature down.  It is common for your temperature to cycle up and down following surgery, especially at night when you are not up moving around and exerting yourself.  The breathing machine keeps your lungs expanded and your temperature down.   DIET:  As you were doing prior to hospitalization, we recommend a well-balanced diet. Stool softners and laxatives are recommended in order to stay regular with BMs, senakot tablets, colace or miralax can be used. A fleets enema if no movement within 3-4 days of surgery is occasionally needed. Drink plenty of liquids, at least 5 glasses of water per day.   DRESSING / WOUND CARE / SHOWERING  Keep the surgical dressing until follow  up.  The dressing is water proof, so you can shower without any extra covering.  IF THE DRESSING FALLS OFF or the wound gets wet inside, change the dressing with sterile gauze.  Please use good hand washing techniques before changing the dressing.  Do not use any lotions or creams on the incision until instructed by your surgeon.    ACTIVITY  Increase activity slowly as tolerated, but follow the weight bearing instructions below.   No driving for 6 weeks or until further direction given by your physician.  You cannot drive while taking narcotics.  No lifting or carrying greater than 10 lbs. until further directed by your surgeon. Avoid periods of inactivity such as sitting longer than an hour when not asleep. This helps prevent blood clots.  You may return to work once you are authorized by your doctor.     WEIGHT BEARING   Weight bearing as tolerated with assist device (walker, cane, etc) as directed, use it as long as suggested by your surgeon or therapist, typically at least 4-6 weeks.   EXERCISES  Results after joint replacement surgery are often greatly improved when you follow the exercise, range of motion and muscle strengthening exercises prescribed by your doctor. Safety measures are also important to protect the joint from further injury. Any time any of these exercises cause you to have increased pain or swelling, decrease what you are doing until you are comfortable again and then slowly increase them. If you have problems or questions, call your caregiver or physical therapist for advice.   Rehabilitation is important following a joint replacement. After just a few days of immobilization, the muscles of the leg can become weakened and shrink (atrophy).  These exercises are designed to build up the tone and strength of the thigh and leg muscles and to improve motion. Often times heat used for twenty to thirty minutes before working out will loosen up your tissues and help with  improving the range of motion but do not use heat for the first two weeks following surgery (sometimes heat can increase post-operative swelling).   These exercises can be done on a training (exercise) mat, on the floor, on a table or on a bed. Use whatever works the best and is most comfortable for you.    Use music or television while you are exercising so that the exercises are a pleasant break in your day. This will make your life better with  the exercises acting as a break in your routine that you can look forward to.   Perform all exercises about fifteen times, three times per day or as directed.  You should exercise both the operative leg and the other leg as well.  Exercises include:   Quad Sets - Tighten up the muscle on the front of the thigh (Quad) and hold for 5-10 seconds.   Straight Leg Raises - With your knee straight (if you were given a brace, keep it on), lift the leg to 60 degrees, hold for 3 seconds, and slowly lower the leg.  Perform this exercise against resistance later as your leg gets stronger.  Leg Slides: Lying on your back, slowly slide your foot toward your buttocks, bending your knee up off the floor (only go as far as is comfortable). Then slowly slide your foot back down until your leg is flat on the floor again.  Angel Wings: Lying on your back spread your legs to the side as far apart as you can without causing discomfort.  Hamstring Strength:  Lying on your back, push your heel against the floor with your leg straight by tightening up the muscles of your buttocks.  Repeat, but this time bend your knee to a comfortable angle, and push your heel against the floor.  You may put a pillow under the heel to make it more comfortable if necessary.   A rehabilitation program following joint replacement surgery can speed recovery and prevent re-injury in the future due to weakened muscles. Contact your doctor or a physical therapist for more information on knee rehabilitation.     CONSTIPATION  Constipation is defined medically as fewer than three stools per week and severe constipation as less than one stool per week.  Even if you have a regular bowel pattern at home, your normal regimen is likely to be disrupted due to multiple reasons following surgery.  Combination of anesthesia, postoperative narcotics, change in appetite and fluid intake all can affect your bowels. Stool softners and laxatives are recommended in order to stay regular with BMs, senakot tablets, colace or miralax can be used. A fleets enema if no movement within 3-4 days of surgery is occasionally needed. Drink plenty of liquids, at least 5 glasses of water per day.   YOU MUST use at least one of the following options; they are listed in order of increasing strength to get the job done.  They are all available over the counter, and you may need to use some, POSSIBLY even all of these options:    Drink plenty of fluids (prune juice may be helpful) and high fiber foods Colace 100 mg by mouth twice a day  Senokot for constipation as directed and as needed Dulcolax (bisacodyl), take with full glass of water  Miralax (polyethylene glycol) once or twice a day as needed.  If you have tried all these things and are unable to have a bowel movement in the first 3-4 days after surgery call either your surgeon or your primary doctor.    If you experience loose stools or diarrhea, hold the medications until you stool forms back up.  If your symptoms do not get better within 1 week or if they get worse, check with your doctor.  If you experience "the worst abdominal pain ever" or develop nausea or vomiting, please contact the office immediately for further recommendations for treatment.   ITCHING:  If you experience itching with your medications, try taking only a single  pain pill, or even half a pain pill at a time.  You can also use Benadryl over the counter for itching or also to help with sleep.   TED HOSE  STOCKINGS:  Use stockings on both legs until for at least 2 weeks or as directed by physician office. They may be removed at night for sleeping.  MEDICATIONS:  See your medication summary on the "After Visit Summary" that nursing will review with you.  You may have some home medications which will be placed on hold until you complete the course of blood thinner medication.  It is important for you to complete the blood thinner medication as prescribed.  PRECAUTIONS:  If you experience chest pain or shortness of breath - call 911 immediately for transfer to the hospital emergency department.   If you develop a fever greater that 101 F, purulent drainage from wound, increased redness or drainage from wound, foul odor from the wound/dressing, or calf pain - CONTACT YOUR SURGEON.                                                   FOLLOW-UP APPOINTMENTS:  If you do not already have a post-op appointment, please call the office for an appointment to be seen by your surgeon.  Guidelines for how soon to be seen are listed in your "After Visit Summary", but are typically between 1-4 weeks after surgery.  OTHER INSTRUCTIONS:   Knee Replacement:  Do not place pillow under knee, focus on keeping the knee straight while resting. CPM instructions: 0-90 degrees, 2 hours in the morning, 2 hours in the afternoon, and 2 hours in the evening. Place foam block, curve side up under heel at all times except when in CPM or when walking.  DO NOT modify, tear, cut, or change the foam block in any way.  MAKE SURE YOU:  Understand these instructions.  Get help right away if you are not doing well or get worse.    Thank you for letting us be a part of your medical care team.  It is a privilege we respect greatly.  We hope these instructions will help you stay on track for a fast and full recovery!   Driving restrictions   Complete by:  As directed    No driving for 3 weeks   Increase activity slowly as tolerated    Complete by:  As directed    Lifting restrictions   Complete by:  As directed    No lifting for 6 weeks      Follow-up Information    [facility] Follow up.   Specialty:  [facility] [place] [facility] Why:  A representative from [facility] will contact you to arrange start date and time for your therapy. Contact information: [address] [place] [address] [phone]        Mcarthur Rossetti, MD Follow up in 2 week(s).   Specialty:  Orthopedic Surgery Contact information: [place] [address] [phone]            Signed: Erskine Emery 09/25/2017, 1:12 PM

## 2017-09-26 DIAGNOSIS — Z7951 Long term (current) use of inhaled steroids: Secondary | ICD-10-CM | POA: Diagnosis not present

## 2017-09-26 DIAGNOSIS — Z7982 Long term (current) use of aspirin: Secondary | ICD-10-CM | POA: Diagnosis not present

## 2017-09-26 DIAGNOSIS — I10 Essential (primary) hypertension: Secondary | ICD-10-CM | POA: Diagnosis not present

## 2017-09-26 DIAGNOSIS — J449 Chronic obstructive pulmonary disease, unspecified: Secondary | ICD-10-CM | POA: Diagnosis not present

## 2017-09-26 DIAGNOSIS — Z87891 Personal history of nicotine dependence: Secondary | ICD-10-CM | POA: Diagnosis not present

## 2017-09-26 DIAGNOSIS — Z85828 Personal history of other malignant neoplasm of skin: Secondary | ICD-10-CM | POA: Diagnosis not present

## 2017-09-26 DIAGNOSIS — Z471 Aftercare following joint replacement surgery: Secondary | ICD-10-CM | POA: Diagnosis not present

## 2017-09-26 DIAGNOSIS — Z96642 Presence of left artificial hip joint: Secondary | ICD-10-CM | POA: Diagnosis not present

## 2017-09-26 DIAGNOSIS — F329 Major depressive disorder, single episode, unspecified: Secondary | ICD-10-CM | POA: Diagnosis not present

## 2017-09-29 DIAGNOSIS — Z87891 Personal history of nicotine dependence: Secondary | ICD-10-CM | POA: Diagnosis not present

## 2017-09-29 DIAGNOSIS — Z96642 Presence of left artificial hip joint: Secondary | ICD-10-CM | POA: Diagnosis not present

## 2017-09-29 DIAGNOSIS — F329 Major depressive disorder, single episode, unspecified: Secondary | ICD-10-CM | POA: Diagnosis not present

## 2017-09-29 DIAGNOSIS — J449 Chronic obstructive pulmonary disease, unspecified: Secondary | ICD-10-CM | POA: Diagnosis not present

## 2017-09-29 DIAGNOSIS — Z7951 Long term (current) use of inhaled steroids: Secondary | ICD-10-CM | POA: Diagnosis not present

## 2017-09-29 DIAGNOSIS — Z7982 Long term (current) use of aspirin: Secondary | ICD-10-CM | POA: Diagnosis not present

## 2017-09-29 DIAGNOSIS — Z85828 Personal history of other malignant neoplasm of skin: Secondary | ICD-10-CM | POA: Diagnosis not present

## 2017-09-29 DIAGNOSIS — Z471 Aftercare following joint replacement surgery: Secondary | ICD-10-CM | POA: Diagnosis not present

## 2017-09-29 DIAGNOSIS — I10 Essential (primary) hypertension: Secondary | ICD-10-CM | POA: Diagnosis not present

## 2017-10-02 ENCOUNTER — Encounter (INDEPENDENT_AMBULATORY_CARE_PROVIDER_SITE_OTHER): Payer: Self-pay | Admitting: Orthopaedic Surgery

## 2017-10-02 ENCOUNTER — Ambulatory Visit (INDEPENDENT_AMBULATORY_CARE_PROVIDER_SITE_OTHER): Payer: Medicare HMO | Admitting: Orthopaedic Surgery

## 2017-10-02 DIAGNOSIS — Z7951 Long term (current) use of inhaled steroids: Secondary | ICD-10-CM | POA: Diagnosis not present

## 2017-10-02 DIAGNOSIS — Z96642 Presence of left artificial hip joint: Secondary | ICD-10-CM | POA: Diagnosis not present

## 2017-10-02 DIAGNOSIS — J449 Chronic obstructive pulmonary disease, unspecified: Secondary | ICD-10-CM | POA: Diagnosis not present

## 2017-10-02 DIAGNOSIS — F329 Major depressive disorder, single episode, unspecified: Secondary | ICD-10-CM | POA: Diagnosis not present

## 2017-10-02 DIAGNOSIS — Z85828 Personal history of other malignant neoplasm of skin: Secondary | ICD-10-CM | POA: Diagnosis not present

## 2017-10-02 DIAGNOSIS — Z87891 Personal history of nicotine dependence: Secondary | ICD-10-CM | POA: Diagnosis not present

## 2017-10-02 DIAGNOSIS — Z471 Aftercare following joint replacement surgery: Secondary | ICD-10-CM | POA: Diagnosis not present

## 2017-10-02 DIAGNOSIS — I10 Essential (primary) hypertension: Secondary | ICD-10-CM | POA: Diagnosis not present

## 2017-10-02 DIAGNOSIS — Z7982 Long term (current) use of aspirin: Secondary | ICD-10-CM | POA: Diagnosis not present

## 2017-10-02 NOTE — Progress Notes (Signed)
The patient is 2 weeks tomorrow status post a left total hip arthroplasty he is doing well overall.  He is off all pain medication.  He is taking baby aspirin twice a day.  He is using a cane to ambulate and has been already released from home health therapy.  On exam his left hip incision looks good.  Remove the old Steri-Strips in place new wounds.  There is no evidence of infection but there was a moderate seroma and I drained 60 cc of serous fluid from the hip area.  Again there is no evidence of infection at all.  His leg lengths are equal.  Continue to ambulate as tolerated increase his activities as tolerated.  I would like to see him back in just 2 weeks but no x-rays are needed.  This is a see whether not he needs any more seroma drained off or not.  He can alternate ice and heat.  All question concerns were answered and addressed.

## 2017-10-13 ENCOUNTER — Telehealth (INDEPENDENT_AMBULATORY_CARE_PROVIDER_SITE_OTHER): Payer: Self-pay | Admitting: Orthopaedic Surgery

## 2017-10-13 ENCOUNTER — Other Ambulatory Visit (INDEPENDENT_AMBULATORY_CARE_PROVIDER_SITE_OTHER): Payer: Self-pay

## 2017-10-13 MED ORDER — AMOXICILLIN 500 MG PO TABS: ORAL_TABLET | ORAL | 0 refills | Status: DC

## 2017-10-13 NOTE — Telephone Encounter (Signed)
Patient called wanted to ask Dr.Blackman if he needed a antibiotic for dental appointment Monday. Brandon Sanford had total replacement of left hip on the 29th of August..

## 2017-10-13 NOTE — Telephone Encounter (Signed)
Patient aware that antibiotic has been sent in for him

## 2017-10-16 ENCOUNTER — Encounter (INDEPENDENT_AMBULATORY_CARE_PROVIDER_SITE_OTHER): Payer: Self-pay | Admitting: Orthopaedic Surgery

## 2017-10-16 ENCOUNTER — Ambulatory Visit (INDEPENDENT_AMBULATORY_CARE_PROVIDER_SITE_OTHER): Payer: Medicare HMO | Admitting: Orthopaedic Surgery

## 2017-10-16 DIAGNOSIS — Z96642 Presence of left artificial hip joint: Secondary | ICD-10-CM

## 2017-10-16 NOTE — Progress Notes (Signed)
Patient is here today for week status post a left total hip arthroplasty.  He is very active 77 year old.  He has a fishing and golfing trip next week.  I want to see him back today due to lower extremity have his first postoperative visit 2 weeks ago.  On exam his left hip incision looks good.  The seroma is significantly dissipated and I do not need to aspirate anything today.  He is walking without assistive device.  He does have some pain at the incision in the knee itself but overall he reports that he is doing well.  He will increase his activities as comfort allows.  I am fine with him trying golf next week.  We will see him back in 4 weeks to see how is doing overall but no x-rays are needed.

## 2017-11-13 ENCOUNTER — Ambulatory Visit (INDEPENDENT_AMBULATORY_CARE_PROVIDER_SITE_OTHER): Payer: Medicare HMO | Admitting: Orthopaedic Surgery

## 2017-11-13 ENCOUNTER — Encounter (INDEPENDENT_AMBULATORY_CARE_PROVIDER_SITE_OTHER): Payer: Self-pay | Admitting: Orthopaedic Surgery

## 2017-11-13 DIAGNOSIS — Z96642 Presence of left artificial hip joint: Secondary | ICD-10-CM

## 2017-11-13 NOTE — Progress Notes (Signed)
The patient is now 7 weeks status post a left total hip arthroplasty.  He reports less stiffness with improved motion and strength of that hip.  He is already played golf several times.  On exam he still has some stiffness with crossing his leg and watching and put his shoes and socks on but otherwise is doing great.  The hip moves fluidly.  His ligaments are equal.  He is walking without a limp.  His incisions healed nicely.  This point we do not need to see him back for 6 months.  He will continue to increase his activity as he tolerates.  We see him back in 6 months I would like a standing low AP pelvis and lateral of his left operative hip.

## 2018-01-02 DIAGNOSIS — K4090 Unilateral inguinal hernia, without obstruction or gangrene, not specified as recurrent: Secondary | ICD-10-CM | POA: Diagnosis not present

## 2018-01-06 ENCOUNTER — Ambulatory Visit: Payer: Self-pay | Admitting: General Surgery

## 2018-01-27 NOTE — Pre-Procedure Instructions (Signed)
MARICUS TANZI  01/27/2018      [place], [place] [address] [place] [address] Phone: [phone] Fax: [phone]    Your procedure is scheduled on November 26, 2018.  Report to [facility] Admitting at 1100 AM.  Call this number if you have problems the morning of surgery:  [phone]   Remember:  Do not eat or drink after midnight.  You may drink clear liquids until .  Clear liquids allowed are:   Water, Juice (non-citric and without pulp), Clear Tea, Black Coffee only and Gatorade    Take these medicines the morning of surgery with A SIP OF WATER  Amlodipine (norvasc) Symbicort inhaler Levothyroxine (synthroid) Oxycodone - if needed for pain Eye Drops-if needed  7 days prior to surgery STOP taking any Aspirin (unless otherwise instructed by your surgeon), Aleve, Naproxen, Ibuprofen, Motrin, Advil, Goody's, BC's, all herbal medications, fish oil, and all vitamins    Do not wear jewelry  Do not wear lotions, powders, or colognes, or deodorant.  Men may shave face and neck.  Do not bring valuables to the hospital.  [facility] is not responsible for any belongings or valuables.  Contacts, dentures or bridgework may not be worn into surgery.  Leave your suitcase in the car.  After surgery it may be brought to your room.  For patients admitted to the hospital, discharge time will be determined by your treatment team.  Patients discharged the day of surgery will not be allowed to drive home.   [place]- Preparing For Surgery  Before surgery, you can play an important role. Because skin is not sterile, your skin needs to be as free of germs as possible. You can reduce the number of germs on your skin by washing with CHG (chlorahexidine gluconate) Soap before surgery.  CHG is an antiseptic cleaner which kills germs and bonds with the skin to continue killing germs even after washing.    Oral Hygiene is  also important to reduce your risk of infection.  Remember - BRUSH YOUR TEETH THE MORNING OF SURGERY WITH YOUR REGULAR TOOTHPASTE  Please do not use if you have an allergy to CHG or antibacterial soaps. If your skin becomes reddened/irritated stop using the CHG.  Do not shave (including legs and underarms) for at least 48 hours prior to first CHG shower. It is OK to shave your face.  Please follow these instructions carefully.   1. Shower the NIGHT BEFORE SURGERY and the MORNING OF SURGERY with CHG.   2. If you chose to wash your hair, wash your hair first as usual with your normal shampoo.  3. After you shampoo, rinse your hair and body thoroughly to remove the shampoo.  4. Use CHG as you would any other liquid soap. You can apply CHG directly to the skin and wash gently with a scrungie or a clean washcloth.   5. Apply the CHG Soap to your body ONLY FROM THE NECK DOWN.  Do not use on open wounds or open sores. Avoid contact with your eyes, ears, mouth and genitals (private parts). Wash Face and genitals (private parts)  with your normal soap.  6. Wash thoroughly, paying special attention to the area where your surgery will be performed.  7. Thoroughly rinse your body with warm water from the neck down.  8. DO NOT shower/wash with your normal soap after using and rinsing off the CHG  Soap.  9. Pat yourself dry with a CLEAN TOWEL.  10. Wear CLEAN PAJAMAS to bed the night before surgery, wear comfortable clothes the morning of surgery  11. Place CLEAN SHEETS on your bed the night of your first shower and DO NOT SLEEP WITH PETS.  Day of Surgery:  Do not apply any deodorants/lotions.  Please wear clean clothes to the hospital/surgery center.   Remember to brush your teeth WITH YOUR REGULAR TOOTHPASTE.   Please read over the following fact sheets that you were given.

## 2018-01-28 ENCOUNTER — Encounter (HOSPITAL_COMMUNITY): Payer: Self-pay

## 2018-01-28 ENCOUNTER — Encounter (HOSPITAL_COMMUNITY)
Admission: RE | Admit: 2018-01-28 | Discharge: 2018-01-28 | Disposition: A | Discharge status: Home / Self Care | Payer: Medicare HMO | Source: Ambulatory Visit | Attending: General Surgery | Admitting: General Surgery

## 2018-01-28 DIAGNOSIS — Z01812 Encounter for preprocedural laboratory examination: Secondary | ICD-10-CM | POA: Insufficient documentation

## 2018-01-28 HISTORY — DX: Gastro-esophageal reflux disease without esophagitis: K21.9

## 2018-01-28 LAB — CBC
HCT: 45.6 % (ref 39.0–52.0)
Hemoglobin: 14.5 g/dL (ref 13.0–17.0)
MCH: 27.7 pg (ref 26.0–34.0)
MCHC: 31.8 g/dL (ref 30.0–36.0)
MCV: 87.2 fL (ref 80.0–100.0)
Platelets: 247 10*3/uL (ref 150–400)
RBC: 5.23 MIL/uL (ref 4.22–5.81)
RDW: 12.8 % (ref 11.5–15.5)
WBC: 5.8 10*3/uL (ref 4.0–10.5)
nRBC: 0 % (ref 0.0–0.2)

## 2018-01-28 LAB — BASIC METABOLIC PANEL
Anion gap: 7 (ref 5–15)
BUN: 19 mg/dL (ref 8–23)
CO2: 25 mmol/L (ref 22–32)
Calcium: 9.9 mg/dL (ref 8.9–10.3)
Chloride: 108 mmol/L (ref 98–111)
Creatinine, Ser: 1.26 mg/dL — ABNORMAL HIGH (ref 0.61–1.24)
GFR calc Af Amer: >60 mL/min (ref >60)
GFR calc non Af Amer: 55 mL/min — ABNORMAL LOW (ref >60)
Glucose, Bld: 98 mg/dL (ref 70–99)
Potassium: 3.8 mmol/L (ref 3.5–5.1)
Sodium: 140 mmol/L (ref 135–145)

## 2018-01-28 NOTE — Pre-Procedure Instructions (Signed)
Brandon Sanford  01/28/2018      [facility] - [place], [place] - [address] [place] [address] Phone: [phone] Fax: [phone]    Your procedure is scheduled on Jan.  15, 2020.  Report to [facility] Admitting at 1100 AM.  Call this number if you have problems the morning of surgery:  [phone]   Remember:  Do not eat after midnight.   You may drink clear liquids until 10:00.  Clear liquids allowed are:   Water, Juice (non-citric and without pulp), Clear Tea, Black Coffee only and Gatorade    Take these medicines the morning of surgery with A SIP OF WATER  Amlodipine (norvasc) Symbicort inhaler Levothyroxine (synthroid) Oxycodone - if needed for pain Eye Drops-if needed  7 days prior to surgery STOP taking any Aspirin (unless otherwise instructed by your surgeon), Aleve, Naproxen, Ibuprofen, Motrin, Advil, Goody's, BC's, all herbal medications, fish oil, and all vitamins    Do not wear jewelry  Do not wear lotions, powders, or colognes, or deodorant.  Men may shave face and neck.  Do not bring valuables to the hospital.  [facility] is not responsible for any belongings or valuables.  Contacts, dentures or bridgework may not be worn into surgery.  Leave your suitcase in the car.  After surgery it may be brought to your room.  For patients admitted to the hospital, discharge time will be determined by your treatment team.  Patients discharged the day of surgery will not be allowed to drive home.   [facility]- Preparing For Surgery  Before surgery, you can play an important role. Because skin is not sterile, your skin needs to be as free of germs as possible. You can reduce the number of germs on your skin by washing with CHG (chlorahexidine gluconate) Soap before surgery.  CHG is an antiseptic cleaner which kills germs and bonds with the skin to continue killing germs even after washing.    Oral Hygiene is  also important to reduce your risk of infection.  Remember - BRUSH YOUR TEETH THE MORNING OF SURGERY WITH YOUR REGULAR TOOTHPASTE  Please do not use if you have an allergy to CHG or antibacterial soaps. If your skin becomes reddened/irritated stop using the CHG.  Do not shave (including legs and underarms) for at least 48 hours prior to first CHG shower. It is OK to shave your face.  Please follow these instructions carefully.   1. Shower the NIGHT BEFORE SURGERY and the MORNING OF SURGERY with CHG.   2. If you chose to wash your hair, wash your hair first as usual with your normal shampoo.  3. After you shampoo, rinse your hair and body thoroughly to remove the shampoo.  4. Use CHG as you would any other liquid soap. You can apply CHG directly to the skin and wash gently with a scrungie or a clean washcloth.   5. Apply the CHG Soap to your body ONLY FROM THE NECK DOWN.  Do not use on open wounds or open sores. Avoid contact with your eyes, ears, mouth and genitals (private parts). Wash Face and genitals (private parts)  with your normal soap.  6. Wash thoroughly, paying special attention to the area where your surgery will be performed.  7. Thoroughly rinse your body with warm water from the neck down.  8. DO NOT shower/wash with your normal soap after using and rinsing off the CHG  Soap.  9. Pat yourself dry with a CLEAN TOWEL.  10. Wear CLEAN PAJAMAS to bed the night before surgery, wear comfortable clothes the morning of surgery  11. Place CLEAN SHEETS on your bed the night of your first shower and DO NOT SLEEP WITH PETS.  Day of Surgery:  Do not apply any deodorants/lotions.  Please wear clean clothes to the hospital/surgery center.   Remember to brush your teeth WITH YOUR REGULAR TOOTHPASTE.   Please read over the following fact sheets that you were given.

## 2018-01-28 NOTE — Progress Notes (Signed)
PCP - Donnie Coffin @ [facility] Cardiologist - denies  Chest x-ray - none EKG - 04/24/17 Stress Test -  20 plus yrs. Ago -normal ECHO - denies Cardiac Cath - denies  Sleep Study - denies CPAP - na  Fasting Blood Sugar - na Checks Blood Sugar _____ times a day  Blood Thinner Instructions: Aspirin Instructions:  Anesthesia review:   Patient denies shortness of breath, fever, cough and chest pain at PAT appointment   Patient verbalized understanding of instructions that were given to them at the PAT appointment. Patient was also instructed that they will need to review over the PAT instructions again at home before surgery.

## 2018-02-04 ENCOUNTER — Ambulatory Visit (HOSPITAL_COMMUNITY): Payer: Medicare HMO | Admitting: Vascular Surgery

## 2018-02-04 ENCOUNTER — Ambulatory Visit (HOSPITAL_COMMUNITY)
Admission: RE | Admit: 2018-02-04 | Discharge: 2018-02-04 | Disposition: A | Discharge status: Home / Self Care | Payer: Medicare HMO | Attending: General Surgery | Admitting: General Surgery

## 2018-02-04 ENCOUNTER — Ambulatory Visit (HOSPITAL_COMMUNITY): Payer: Medicare HMO | Admitting: Certified Registered Nurse Anesthetist

## 2018-02-04 ENCOUNTER — Encounter (HOSPITAL_COMMUNITY)
Admission: RE | Disposition: A | Discharge status: Home / Self Care | Payer: Self-pay | Source: Home / Self Care | Attending: General Surgery

## 2018-02-04 DIAGNOSIS — E039 Hypothyroidism, unspecified: Secondary | ICD-10-CM | POA: Insufficient documentation

## 2018-02-04 DIAGNOSIS — Z79899 Other long term (current) drug therapy: Secondary | ICD-10-CM | POA: Insufficient documentation

## 2018-02-04 DIAGNOSIS — Z96642 Presence of left artificial hip joint: Secondary | ICD-10-CM | POA: Diagnosis not present

## 2018-02-04 DIAGNOSIS — D176 Benign lipomatous neoplasm of spermatic cord: Secondary | ICD-10-CM | POA: Diagnosis not present

## 2018-02-04 DIAGNOSIS — K4090 Unilateral inguinal hernia, without obstruction or gangrene, not specified as recurrent: Secondary | ICD-10-CM | POA: Insufficient documentation

## 2018-02-04 DIAGNOSIS — I10 Essential (primary) hypertension: Secondary | ICD-10-CM | POA: Insufficient documentation

## 2018-02-04 DIAGNOSIS — J449 Chronic obstructive pulmonary disease, unspecified: Secondary | ICD-10-CM | POA: Insufficient documentation

## 2018-02-04 DIAGNOSIS — Z7952 Long term (current) use of systemic steroids: Secondary | ICD-10-CM | POA: Insufficient documentation

## 2018-02-04 DIAGNOSIS — Z87891 Personal history of nicotine dependence: Secondary | ICD-10-CM | POA: Diagnosis not present

## 2018-02-04 DIAGNOSIS — Z79890 Hormone replacement therapy (postmenopausal): Secondary | ICD-10-CM | POA: Diagnosis not present

## 2018-02-04 DIAGNOSIS — G8918 Other acute postprocedural pain: Secondary | ICD-10-CM | POA: Diagnosis not present

## 2018-02-04 HISTORY — PX: INSERTION OF MESH: SHX5868

## 2018-02-04 HISTORY — PX: INGUINAL HERNIA REPAIR: SHX194

## 2018-02-04 SURGERY — REPAIR, HERNIA, INGUINAL, ADULT
Anesthesia: General | Site: Groin | Laterality: Right

## 2018-02-04 MED ORDER — CHLORHEXIDINE GLUCONATE CLOTH 2 % EX PADS: 6.0000 | MEDICATED_PAD | Freq: Once | CUTANEOUS | Status: DC

## 2018-02-04 MED ORDER — CEFAZOLIN SODIUM-DEXTROSE 2-4 GM/100ML-% IV SOLN
2.0000 g | INTRAVENOUS | Status: AC
  Administered 2018-02-04: 2 g via INTRAVENOUS

## 2018-02-04 MED ORDER — ROCURONIUM BROMIDE 50 MG/5ML IV SOSY
PREFILLED_SYRINGE | INTRAVENOUS | Status: AC
  Filled 2018-02-04: qty 5

## 2018-02-04 MED ORDER — MIDAZOLAM HCL 2 MG/2ML IJ SOLN
INTRAMUSCULAR | Status: AC
  Administered 2018-02-04: 1.5 mg
  Filled 2018-02-04: qty 2

## 2018-02-04 MED ORDER — ONDANSETRON HCL 4 MG/2ML IJ SOLN
INTRAMUSCULAR | Status: DC | PRN
  Administered 2018-02-04: 4 mg via INTRAVENOUS

## 2018-02-04 MED ORDER — ALBUTEROL SULFATE HFA 108 (90 BASE) MCG/ACT IN AERS
INHALATION_SPRAY | RESPIRATORY_TRACT | Status: DC | PRN
  Administered 2018-02-04: 4 via RESPIRATORY_TRACT

## 2018-02-04 MED ORDER — 0.9 % SODIUM CHLORIDE (POUR BTL) OPTIME
TOPICAL | Status: DC | PRN
  Administered 2018-02-04: 1000 mL

## 2018-02-04 MED ORDER — BUPIVACAINE-EPINEPHRINE 0.25% -1:200000 IJ SOLN
INTRAMUSCULAR | Status: DC | PRN
  Administered 2018-02-04: 10 mL

## 2018-02-04 MED ORDER — LIDOCAINE 2% (20 MG/ML) 5 ML SYRINGE
INTRAMUSCULAR | Status: DC | PRN
  Administered 2018-02-04: 60 mg via INTRAVENOUS

## 2018-02-04 MED ORDER — ALBUTEROL SULFATE HFA 108 (90 BASE) MCG/ACT IN AERS
INHALATION_SPRAY | RESPIRATORY_TRACT | Status: AC
  Filled 2018-02-04: qty 6.7

## 2018-02-04 MED ORDER — LACTATED RINGERS IV SOLN
INTRAVENOUS | Status: DC
  Administered 2018-02-04 (×2): via INTRAVENOUS

## 2018-02-04 MED ORDER — GABAPENTIN 300 MG PO CAPS
300.0000 mg | ORAL_CAPSULE | ORAL | Status: AC
  Administered 2018-02-04: 300 mg via ORAL

## 2018-02-04 MED ORDER — FENTANYL CITRATE (PF) 100 MCG/2ML IJ SOLN
25.0000 ug | INTRAMUSCULAR | Status: DC | PRN
Start: 2018-02-04 — End: 2018-02-04

## 2018-02-04 MED ORDER — ACETAMINOPHEN 500 MG PO TABS
ORAL_TABLET | ORAL | Status: AC
  Administered 2018-02-04: 1000 mg via ORAL
  Filled 2018-02-04: qty 2

## 2018-02-04 MED ORDER — ONDANSETRON HCL 4 MG/2ML IJ SOLN
INTRAMUSCULAR | Status: AC
  Filled 2018-02-04: qty 2

## 2018-02-04 MED ORDER — BUPIVACAINE-EPINEPHRINE (PF) 0.25% -1:200000 IJ SOLN
INTRAMUSCULAR | Status: AC
  Filled 2018-02-04: qty 30

## 2018-02-04 MED ORDER — ROCURONIUM BROMIDE 50 MG/5ML IV SOSY
PREFILLED_SYRINGE | INTRAVENOUS | Status: DC | PRN
  Administered 2018-02-04: 50 mg via INTRAVENOUS

## 2018-02-04 MED ORDER — PROPOFOL 10 MG/ML IV BOLUS
INTRAVENOUS | Status: AC
  Filled 2018-02-04: qty 40

## 2018-02-04 MED ORDER — CEFAZOLIN SODIUM-DEXTROSE 2-4 GM/100ML-% IV SOLN
INTRAVENOUS | Status: AC
  Filled 2018-02-04: qty 100

## 2018-02-04 MED ORDER — FENTANYL CITRATE (PF) 100 MCG/2ML IJ SOLN
INTRAMUSCULAR | Status: AC
  Administered 2018-02-04: 75 ug
  Filled 2018-02-04: qty 2

## 2018-02-04 MED ORDER — DEXAMETHASONE SODIUM PHOSPHATE 10 MG/ML IJ SOLN
INTRAMUSCULAR | Status: AC
  Filled 2018-02-04: qty 1

## 2018-02-04 MED ORDER — EPHEDRINE SULFATE 50 MG/ML IJ SOLN
INTRAMUSCULAR | Status: DC | PRN
  Administered 2018-02-04 (×2): 10 mg via INTRAVENOUS

## 2018-02-04 MED ORDER — ACETAMINOPHEN 500 MG PO TABS
1000.0000 mg | ORAL_TABLET | ORAL | Status: AC
  Administered 2018-02-04: 1000 mg via ORAL

## 2018-02-04 MED ORDER — FENTANYL CITRATE (PF) 250 MCG/5ML IJ SOLN
INTRAMUSCULAR | Status: DC | PRN
  Administered 2018-02-04: 100 ug via INTRAVENOUS

## 2018-02-04 MED ORDER — SUGAMMADEX SODIUM 200 MG/2ML IV SOLN
INTRAVENOUS | Status: DC | PRN
  Administered 2018-02-04: 167 mg via INTRAVENOUS

## 2018-02-04 MED ORDER — OXYCODONE HCL 5 MG PO TABS
5.0000 mg | ORAL_TABLET | Freq: Once | ORAL | Status: AC
  Administered 2018-02-04: 5 mg via ORAL

## 2018-02-04 MED ORDER — OXYCODONE HCL 5 MG PO TABS
ORAL_TABLET | ORAL | Status: AC
  Filled 2018-02-04: qty 1

## 2018-02-04 MED ORDER — LIDOCAINE 2% (20 MG/ML) 5 ML SYRINGE
INTRAMUSCULAR | Status: AC
  Filled 2018-02-04: qty 5

## 2018-02-04 MED ORDER — OXYCODONE HCL 5 MG PO TABS: 5.0000 mg | ORAL_TABLET | Freq: Four times a day (QID) | ORAL | 0 refills | Status: DC | PRN

## 2018-02-04 MED ORDER — PROPOFOL 10 MG/ML IV BOLUS
INTRAVENOUS | Status: DC | PRN
  Administered 2018-02-04: 150 mg via INTRAVENOUS

## 2018-02-04 MED ORDER — GABAPENTIN 300 MG PO CAPS
ORAL_CAPSULE | ORAL | Status: AC
  Administered 2018-02-04: 300 mg via ORAL
  Filled 2018-02-04: qty 1

## 2018-02-04 MED ORDER — FENTANYL CITRATE (PF) 250 MCG/5ML IJ SOLN
INTRAMUSCULAR | Status: AC
  Filled 2018-02-04: qty 5

## 2018-02-04 MED ORDER — DEXAMETHASONE SODIUM PHOSPHATE 10 MG/ML IJ SOLN
INTRAMUSCULAR | Status: DC | PRN
  Administered 2018-02-04: 10 mg via INTRAVENOUS

## 2018-02-04 SURGICAL SUPPLY — 48 items
BLADE CLIPPER SURG (BLADE) ×1 IMPLANT
BLADE SURG 10 STRL SS (BLADE) ×3 IMPLANT
BLADE SURG 15 STRL LF DISP TIS (BLADE) ×2 IMPLANT
BLADE SURG 15 STRL SS (BLADE) ×1
CANISTER SUCT 3000ML PPV (MISCELLANEOUS) IMPLANT
CHLORAPREP W/TINT 26ML (MISCELLANEOUS) ×3 IMPLANT
COVER SURGICAL LIGHT HANDLE (MISCELLANEOUS) ×3 IMPLANT
COVER WAND RF STERILE (DRAPES) ×2 IMPLANT
DERMABOND ADVANCED (GAUZE/BANDAGES/DRESSINGS) ×1
DERMABOND ADVANCED .7 DNX12 (GAUZE/BANDAGES/DRESSINGS) ×2 IMPLANT
DRAIN PENROSE 1/2X12 LTX STRL (WOUND CARE) ×1 IMPLANT
DRAPE LAPAROTOMY 100X72 PEDS (DRAPES) ×3 IMPLANT
DRAPE UTILITY XL STRL (DRAPES) ×3 IMPLANT
ELECT CAUTERY BLADE 6.4 (BLADE) ×2 IMPLANT
ELECT REM PT RETURN 9FT ADLT (ELECTROSURGICAL) ×2
ELECTRODE REM PT RTRN 9FT ADLT (ELECTROSURGICAL) ×2 IMPLANT
GAUZE 4X4 16PLY RFD (DISPOSABLE) ×1 IMPLANT
GLOVE BIO SURGEON STRL SZ8 (GLOVE) ×3 IMPLANT
GLOVE BIOGEL PI IND STRL 8 (GLOVE) ×2 IMPLANT
GLOVE BIOGEL PI INDICATOR 8 (GLOVE) ×1
GOWN STRL REUS W/ TWL LRG LVL3 (GOWN DISPOSABLE) ×2 IMPLANT
GOWN STRL REUS W/ TWL XL LVL3 (GOWN DISPOSABLE) ×2 IMPLANT
GOWN STRL REUS W/TWL LRG LVL3 (GOWN DISPOSABLE) ×1
GOWN STRL REUS W/TWL XL LVL3 (GOWN DISPOSABLE) ×1
KIT BASIN OR (CUSTOM PROCEDURE TRAY) ×3 IMPLANT
KIT TURNOVER KIT B (KITS) ×3 IMPLANT
MESH HERNIA 3X6 (Mesh General) ×1 IMPLANT
NEEDLE 22X1 1/2 (OR ONLY) (NEEDLE) ×3 IMPLANT
NS IRRIG 1000ML POUR BTL (IV SOLUTION) ×3 IMPLANT
PACK SURGICAL SETUP 50X90 (CUSTOM PROCEDURE TRAY) ×3 IMPLANT
PAD ARMBOARD 7.5X6 YLW CONV (MISCELLANEOUS) ×3 IMPLANT
PENCIL BUTTON HOLSTER BLD 10FT (ELECTRODE) ×3 IMPLANT
PENCIL SMOKE EVACUATOR (MISCELLANEOUS) ×1 IMPLANT
SPECIMEN JAR SMALL (MISCELLANEOUS) IMPLANT
SPONGE LAP 18X18 X RAY DECT (DISPOSABLE) ×3 IMPLANT
SUT MNCRL AB 4-0 PS2 18 (SUTURE) ×3 IMPLANT
SUT PROLENE 2 0 CT2 30 (SUTURE) ×9 IMPLANT
SUT VIC AB 2-0 SH 27 (SUTURE) ×1
SUT VIC AB 2-0 SH 27X BRD (SUTURE) ×2 IMPLANT
SUT VIC AB 3-0 SH 27 (SUTURE) ×1
SUT VIC AB 3-0 SH 27X BRD (SUTURE) ×2 IMPLANT
SUT VICRYL AB 3 0 TIES (SUTURE) ×1 IMPLANT
SYR BULB 3OZ (MISCELLANEOUS) ×3 IMPLANT
SYR CONTROL 10ML LL (SYRINGE) ×3 IMPLANT
TOWEL OR 17X24 6PK STRL BLUE (TOWEL DISPOSABLE) ×3 IMPLANT
TOWEL OR 17X26 10 PK STRL BLUE (TOWEL DISPOSABLE) ×2 IMPLANT
TUBE CONNECTING 12X1/4 (SUCTIONS) ×1 IMPLANT
YANKAUER SUCT BULB TIP NO VENT (SUCTIONS) ×1 IMPLANT

## 2018-02-04 NOTE — Interval H&P Note (Signed)
History and Physical Interval Note:  02/04/2018 12:28 PM  Brandon Sanford  has presented today for surgery, with the diagnosis of right inguinal hernia  The various methods of treatment have been discussed with the patient and family. After consideration of risks, benefits and other options for treatment, the patient has consented to  Procedure(s): REPAIR OF RIGHT INGUINAL HERNIA WITH MESH (Right) INSERTION OF MESH (Right) as a surgical intervention .  The patient's history has been reviewed, patient examined, no change in status, stable for surgery.  I have reviewed the patient's chart and labs.  Questions were answered to the patient's satisfaction.     Zenovia Jarred

## 2018-02-04 NOTE — Op Note (Signed)
02/04/2018  2:18 PM  PATIENT:  Brandon Sanford  77 y.o. male  PRE-OPERATIVE DIAGNOSIS:  right inguinal hernia  POST-OPERATIVE DIAGNOSIS:  right inguinal hernia  PROCEDURE:  Procedure(s): REPAIR OF RIGHT INGUINAL HERNIA WITH MESH INSERTION OF MESH  SURGEON:  Surgeon(s): Georganna Skeans, MD  ASSISTANTS: none   ANESTHESIA:   local, regional and general  EBL:  Total I/O In: 1000 [I.V.:1000] Out: 26 [Blood:50]  BLOOD ADMINISTERED:none  DRAINS: none   SPECIMEN:  No Specimen  DISPOSITION OF SPECIMEN:  N/A  COUNTS:  YES  DICTATION: .Dragon Dictation Findings: Indirect hernia, very large cord lipoma  Procedure in detail: [name] presents for right inguinal hernia repair with mesh.  He was identified in the preop holding area.  Informed consent was obtained.  He received intravenous antibiotics.  He was brought to the operating room after anesthesia placed a TAP block.  General anesthesia was administered by the anesthesia staff.  His abdomen and groins were prepped and draped in sterile fashion.  We did a timeout procedure.  Local was injected in the right inguinal region.  Right inguinal incision was made.  Subcutaneous tissues were dissected down through Scarpa's fascia revealing the external oblique fascia.  This was divided laterally and the division continued medially through the external ring.  The superior leaflet was dissected off the external oblique.  The inferior leaflet was dissected down revealing the shelving edge of the inguinal ligament.  The cord structures were encircled with a Penrose drain.  The cord structures were then dissected revealing a very large cord lipoma.  This was excised.  There was a indirect hernia sac which was dissected free and high ligated.  All contents had reduced.  The hernia repair was then completed with a keyhole polypropylene mesh.  This was tacked with 2-0 Prolene in a running fashion to the shelving edge of the inguinal ligament inferiorly.   Superiorly it was tacked to the transversalis.  The 2 leaflets were rejoined behind the cord and tacked together and down to the underlying musculature.  The leaflets were left long and tucked out laterally underneath the external oblique fascia.  The area was irrigated hemostasis was ensured.  The external oblique fascia was closed with running 2-0 Vicryl.  Scarpa's fascia was closed with interrupted 3-0 Vicryl.  Skin was closed with running 4-0 Monocryl followed by Dermabond.  All counts were correct.  He tolerated the procedure well without apparent complication.  Right testicle was returned to anatomic position in the scrotum at the completion of the procedure and he was taken recovery in stable condition. PATIENT DISPOSITION:  PACU - hemodynamically stable.   Delay start of Pharmacological VTE agent (>24hrs) due to surgical blood loss or risk of bleeding:  no  Georganna Skeans, MD, MPH, FACS Pager: [phone]  1/15/20202:18 PM

## 2018-02-04 NOTE — Transfer of Care (Signed)
Immediate Anesthesia Transfer of Care Note  Patient: Brandon Sanford  Procedure(s) Performed: REPAIR OF RIGHT INGUINAL HERNIA WITH MESH (Right Groin) INSERTION OF MESH (Right Groin)  Patient Location: PACU  Anesthesia Type:General  Level of Consciousness: awake, drowsy and patient cooperative  Airway & Oxygen Therapy: Patient Spontanous Breathing and Patient connected to face mask oxygen  Post-op Assessment: Report given to RN and Post -op Vital signs reviewed and stable  Post vital signs: Reviewed and stable  Last Vitals:  Vitals Value Taken Time  BP    Temp    Pulse 90 02/04/2018  2:24 PM  Resp 12 02/04/2018  2:24 PM  SpO2 100 % 02/04/2018  2:24 PM  Vitals shown include unvalidated device data.  Last Pain:  Vitals:   02/04/18 1209  TempSrc:   PainSc: 0-No pain         Complications: No apparent anesthesia complications

## 2018-02-04 NOTE — Anesthesia Postprocedure Evaluation (Signed)
Anesthesia Post Note  Patient: Brandon Sanford  Procedure(s) Performed: REPAIR OF RIGHT INGUINAL HERNIA WITH MESH (Right Groin) INSERTION OF MESH (Right Groin)     Patient location during evaluation: PACU Anesthesia Type: General Level of consciousness: awake Pain management: pain level controlled Vital Signs Assessment: post-procedure vital signs reviewed and stable Respiratory status: spontaneous breathing Cardiovascular status: stable Postop Assessment: no apparent nausea or vomiting Anesthetic complications: no    Last Vitals:  Vitals:   02/04/18 1455 02/04/18 1510  BP: (!) 162/90 (!) 160/94  Pulse: 79 88  Resp: 12 14  Temp:  36.6 C  SpO2: 95% 95%    Last Pain:  Vitals:   02/04/18 1451  TempSrc:   PainSc: 5                  Wayne Wicklund

## 2018-02-04 NOTE — Anesthesia Procedure Notes (Signed)
Procedure Name: Intubation Date/Time: 02/04/2018 1:20 PM Performed by: Kathryne Hitch, CRNA Pre-anesthesia Checklist: Patient identified, Suction available, Emergency Drugs available, Patient being monitored and Timeout performed Patient Re-evaluated:Patient Re-evaluated prior to induction Oxygen Delivery Method: Circle system utilized Preoxygenation: Pre-oxygenation with 100% oxygen Induction Type: IV induction Ventilation: Mask ventilation without difficulty Laryngoscope Size: Miller and 2 Grade View: Grade I Tube type: Oral Tube size: 7.5 mm Number of attempts: 1 Airway Equipment and Method: Stylet Placement Confirmation: ETT inserted through vocal cords under direct vision,  positive ETCO2 and breath sounds checked- equal and bilateral Secured at: 22 cm Tube secured with: Tape Dental Injury: Teeth and Oropharynx as per pre-operative assessment

## 2018-02-04 NOTE — Anesthesia Preprocedure Evaluation (Signed)
Anesthesia Evaluation  Patient identified by MRN, date of birth, ID band Patient awake    Reviewed: Allergy & Precautions, NPO status , Patient's Chart, lab work & pertinent test results  Airway Mallampati: II  TM Distance: >3 FB     Dental   Pulmonary shortness of breath, asthma , COPD, former smoker,    breath sounds clear to auscultation       Cardiovascular hypertension,  Rhythm:Regular Rate:Normal     Neuro/Psych    GI/Hepatic Neg liver ROS, GERD  ,  Endo/Other  Hypothyroidism   Renal/GU      Musculoskeletal   Abdominal   Peds  Hematology   Anesthesia Other Findings   Reproductive/Obstetrics                             Anesthesia Physical Anesthesia Plan  ASA: III  Anesthesia Plan: General   Post-op Pain Management:    Induction: Intravenous  PONV Risk Score and Plan: Treatment may vary due to age or medical condition, Ondansetron, Dexamethasone and Midazolam  Airway Management Planned: Oral ETT  Additional Equipment:   Intra-op Plan:   Post-operative Plan: Extubation in OR  Informed Consent: I have reviewed the patients History and Physical, chart, labs and discussed the procedure including the risks, benefits and alternatives for the proposed anesthesia with the patient or authorized representative who has indicated his/her understanding and acceptance.     Dental advisory given  Plan Discussed with: CRNA and Anesthesiologist  Anesthesia Plan Comments:         Anesthesia Quick Evaluation

## 2018-02-04 NOTE — Anesthesia Procedure Notes (Signed)
Anesthesia Regional Block: TAP block   Pre-Anesthetic Checklist: ,, timeout performed, Correct Patient, Correct Site, Correct Laterality, Correct Procedure, Correct Position, site marked, Risks and benefits discussed,  Surgical consent,  Pre-op evaluation,  At surgeon's request and post-op pain management  Laterality: Right  Prep: Dura Prep       Needles:   Needle Type: Echogenic Stimulator Needle          Additional Needles:   Narrative:  Start time: 02/04/2018 12:50 PM End time: 02/04/2018 1:05 PM Injection made incrementally with aspirations every 5 mL.  Performed by: Personally  Anesthesiologist: Belinda Block, MD

## 2018-02-04 NOTE — H&P (Signed)
  Brandon Sanford Documented: 01/02/2018 4:38 PM Location: [facility] Patient #: [id number] DOB: 1940/05/27 Married / Language: [name] / Race: White Male   History of Present Illness Brandon Sanford Silos MD; 01/02/2018 4:46 PM) The patient is a 78 year old male who presents with an inguinal hernia. I saw [name] in April regarding a right inguinal hernia. We scheduled surgery at that time but then his wife fell and broke her wrist and hip so he had to cancel. Then in August, he underwent a left total hip replacement. He has recovered well from that. His [place] little hernia is starting to bother him again and he would like to schedule repair. It came out while he was playing golf and he reduced it himself. It is mostly staying in since then. No change in bowel habits.   Allergies Nance Pew, CMA; 01/02/2018 4:40 PM) No Known Drug Allergies [05/14/2017]: Allergies Reconciled   Medication History Nance Pew, CMA; 01/02/2018 4:39 PM) AmLODIPine Besylate (5MG  Tablet, Oral) Active. Levothyroxine Sodium (100MCG Tablet, Oral) Active. Simvastatin (40MG  Tablet, Oral) Active. Symbicort (160-4.5MCG/ACT Aerosol, Inhalation) Active. Medications Reconciled  Vitals (Sabrina Canty CMA; 01/02/2018 4:39 PM) 01/02/2018 4:39 PM Weight: 184 lb Height: 69in Body Surface Area: 1.99 m Body Mass Index: 27.17 kg/m  Temp.: 75F  Pulse: 98 (Regular)  P.OX: 100% (Room air) BP: 152/92 (Sitting, Left Arm, Standard)       Physical Exam Brandon Sanford Silos MD; 01/02/2018 4:48 PM) General Note: No distress   Integumentary Note: Warm and dry   Chest and Lung Exam Note: Clear to auscultation bilaterally   Cardiovascular Note: Regular rate and rhythm   Male Genitourinary Note: Right inguinal hernia is reduced, it is palpable, however, it does not extend down into his scrotum, no recurrent left inguinal hernia present   Neurologic Note: Awake and  alert, speech fluent   Musculoskeletal Note: Incision anterior left hip     Assessment & Plan Brandon Sanford Silos MD; 01/02/2018 4:48 PM) INGUINAL HERNIA OF RIGHT SIDE WITHOUT OBSTRUCTION OR GANGRENE (K40.90) Impression: I have offered right inguinal hernia repair with mesh. I discussed the procedure, risks, benefits, and expected postoperative course with him. I answered his questions. He looks forward to scheduling.   1/15 update - no chabges.

## 2018-02-05 ENCOUNTER — Encounter (HOSPITAL_COMMUNITY): Payer: Self-pay | Admitting: General Surgery

## 2018-02-18 DIAGNOSIS — E7800 Pure hypercholesterolemia, unspecified: Secondary | ICD-10-CM | POA: Diagnosis not present

## 2018-02-18 DIAGNOSIS — I10 Essential (primary) hypertension: Secondary | ICD-10-CM | POA: Diagnosis not present

## 2018-02-18 DIAGNOSIS — J449 Chronic obstructive pulmonary disease, unspecified: Secondary | ICD-10-CM | POA: Diagnosis not present

## 2018-02-18 DIAGNOSIS — E039 Hypothyroidism, unspecified: Secondary | ICD-10-CM | POA: Diagnosis not present

## 2018-04-28 ENCOUNTER — Ambulatory Visit (INDEPENDENT_AMBULATORY_CARE_PROVIDER_SITE_OTHER): Payer: Self-pay

## 2018-04-28 ENCOUNTER — Other Ambulatory Visit: Payer: Self-pay

## 2018-04-28 ENCOUNTER — Ambulatory Visit (INDEPENDENT_AMBULATORY_CARE_PROVIDER_SITE_OTHER): Payer: Medicare HMO | Admitting: Orthopaedic Surgery

## 2018-04-28 ENCOUNTER — Encounter (INDEPENDENT_AMBULATORY_CARE_PROVIDER_SITE_OTHER): Payer: Self-pay | Admitting: Orthopaedic Surgery

## 2018-04-28 DIAGNOSIS — Z96642 Presence of left artificial hip joint: Secondary | ICD-10-CM | POA: Diagnosis not present

## 2018-04-28 DIAGNOSIS — M25561 Pain in right knee: Secondary | ICD-10-CM | POA: Diagnosis not present

## 2018-04-28 MED ORDER — METHYLPREDNISOLONE ACETATE 40 MG/ML IJ SUSP
40.0000 mg | INTRAMUSCULAR | Status: AC | PRN
  Administered 2018-04-28: 40 mg via INTRA_ARTICULAR

## 2018-04-28 MED ORDER — LIDOCAINE HCL 1 % IJ SOLN
3.0000 mL | INTRAMUSCULAR | Status: AC | PRN
  Administered 2018-04-28: 3 mL

## 2018-04-28 NOTE — Progress Notes (Signed)
Office Visit Note   Patient: Brandon Sanford           Date of Birth: November 16, 1940           MRN: [id number] Visit Date: 04/28/2018              Requested by: Alroy Dust, L.Marlou Sa, [address] [place], [place] [address] PCP: Alroy Dust, L.Marlou Sa, MD   Assessment & Plan: Visit Diagnoses:  1. History of left hip replacement   2. Right knee pain, unspecified chronicity     Plan: We will have him work on quad strengthening exercises were shown to have him demonstrate these back today.  See him back in a month to see what type of response he had to the injection.  He is to be mindful of any mechanical symptoms in the knee.  In regards to his hip incision Dr. Caryl Comes was given some hydrocortisone cream that he can apply twice daily to the area.  If condition worsens needs to stop this.  Questions encouraged and answered at length.  Follow-Up Instructions: Return in about 4 weeks (around 05/26/2018).   Orders:  Orders Placed This Encounter  Procedures  . Large Joint Inj  . XR HIP UNILAT W OR W/O PELVIS 1V LEFT  . XR Knee 1-2 Views Right   No orders of the defined types were placed in this encounter.     Procedures: Large Joint Inj: R knee on 04/28/2018 9:19 AM Indications: pain Details: 22 G 1.5 in needle, anterolateral approach  Arthrogram: No  Medications: 3 mL lidocaine 1 %; 40 mg methylPREDNISolone acetate 40 MG/ML Outcome: tolerated well, no immediate complications Procedure, treatment alternatives, risks and benefits explained, specific risks discussed. Consent was given by the patient. Immediately prior to procedure a time out was called to verify the correct patient, procedure, equipment, support staff and site/side marked as required. Patient was prepped and draped in the usual sterile fashion.       Clinical Data: No additional findings.   Subjective: Chief Complaint  Patient presents with  . Left Hip - Follow-up  . Right Knee - Pain    HPI Brandon Sanford  returns today 7 months status post left total hip arthroplasty.  Left hip is doing well.  His only concern about the hips he has had in the area that is circular which he thought was may be ringworm has been using over-the-counter medication which has not helped.  He states the area is itchy at times.  He states is unchanged.  He has had no fevers chills.  His main complaint today is right knee pain that is been ongoing for the past couple weeks.  No known injury.  Knee feels weak does not give way or lock catch or any painful popping.  Pains medial aspect of the knee.  Notes some slight swelling in the knee.  Review of Systems See HPI otherwise negative  Objective: Vital Signs: There were no vitals taken for this visit.  Physical Exam General: Well-developed well-nourished male no acute distress mood affect appropriate.  Psych: Alert and oriented x3 Ortho Exam Left hip excellent range of motion without pain.  Surgical incisions healing well.  Mid incision there is an  area of circular rash uniform in color approximately size of a nickel no significant erythema.  Some dryness to the skin in this area.  Left calf supple nontender.  Dorsiflexion plantarflexion ankle intact. Bilateral knees full range of motion.  Right knee with  slight tenderness along medial joint line.  No instability valgus varus stressing of either knee.  No warmth erythema or effusion of either knee.  Slight edema of the right knee compared to left.  Right knee negative McMurray's. Specialty Comments:  No specialty comments available.  Imaging: Xr Hip Unilat W Or W/o Pelvis 1v Left  Result Date: 04/28/2018 AP pelvis and lateral view of the left hip: Status post left total hip arthroplasty with well-seated components.  No evidence of fracture or hardware failure.  Xr Knee 1-2 Views Right  Result Date: 04/28/2018 Right knee AP lateral views: Medial lateral joint line well-maintained.  Mild patellofemoral changes.  No acute  fractures no bony abnormalities otherwise.    PMFS History: Patient Active Problem List   Diagnosis Date Noted  . 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 Pain in 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  . 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 Umbilical pain 16/96/7893  . ASTHMA W/ COPD 06/03/2007  . PULMONARY NODULE 04/27/2007  . DYSPNEA 04/27/2007  . HYPOTHYROIDISM 04/24/2007  . DEPRESSION 04/24/2007  . HYPERTENSION 04/24/2007   Past Medical History:  Diagnosis Date  . Allergy   . Arthritis   . Asthma    as a child  . Cancer ([facility])    skin - neck  "pre- cancer"  . COPD (chronic obstructive pulmonary disease) ([facility])   . Depression    pt denies  . Dyspnea    With exertion  . GERD (gastroesophageal reflux disease)   . Hyperlipidemia   . Hypertension   . Hypothyroidism   . Inguinal hernia   . Plantar fasciitis   . Skin moles   . Thyroid disease   . Umbilical hernia     Family History  Problem Relation Age of Onset  . Colon cancer Neg Hx     Past Surgical History:  Procedure Laterality Date  . COLONOSCOPY W/ POLYPECTOMY    . HERNIA REPAIR  81/0/17   umbilical hernia and LIH repair   . INGUINAL HERNIA REPAIR  11/26/2010   Procedure: HERNIA REPAIR INGUINAL ADULT;  Surgeon: Zenovia Jarred, MD;  Location: [facility];  Service: General;  Laterality: Left;  left inguinal hernia repair with mesh  . INGUINAL HERNIA REPAIR Right 02/04/2018   Procedure: REPAIR OF RIGHT INGUINAL HERNIA WITH MESH;  Surgeon: Georganna Skeans, MD;  Location: [facility];  Service: General;  Laterality: Right;  . INSERTION OF MESH Right 02/04/2018   Procedure: INSERTION OF MESH;  Surgeon: Georganna Skeans, MD;  Location: [facility];  Service: General;  Laterality: Right;  . JOINT REPLACEMENT    . TONSILLECTOMY    . 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  . 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   Procedure: LEFT TOTAL HIP ARTHROPLASTY ANTERIOR APPROACH;  Surgeon: Mcarthur Rossetti, MD;  Location: [facility];  Service: Orthopedics;  Laterality: Left;  . UMBILICAL HERNIA REPAIR  11/26/2010   Procedure: HERNIA REPAIR UMBILICAL ADULT;  Surgeon: Zenovia Jarred, MD;  Location: [facility];  Service: General;  Laterality: N/A;  umbilical hernia repair with mesh   Social History   Occupational History  . Not on file  Tobacco Use  . Smoking status: Former Smoker    Years: 40.00    Last attempt to quit: 11/12/1995    Years since quitting: 22.4  . Smokeless tobacco: Never Used  Substance and Sexual Activity  . Alcohol use: Yes    Alcohol/week: 4.0 standard drinks  Types: 4 Cans of beer per week  . Drug use: No  . Sexual activity: Not on file

## 2018-05-12 ENCOUNTER — Ambulatory Visit (INDEPENDENT_AMBULATORY_CARE_PROVIDER_SITE_OTHER): Payer: Medicare HMO | Admitting: Orthopaedic Surgery

## 2018-06-01 ENCOUNTER — Ambulatory Visit: Payer: Self-pay | Admitting: Orthopaedic Surgery

## 2018-06-12 DIAGNOSIS — H2513 Age-related nuclear cataract, bilateral: Secondary | ICD-10-CM | POA: Diagnosis not present

## 2018-06-12 DIAGNOSIS — H43812 Vitreous degeneration, left eye: Secondary | ICD-10-CM | POA: Diagnosis not present

## 2018-06-12 DIAGNOSIS — H04123 Dry eye syndrome of bilateral lacrimal glands: Secondary | ICD-10-CM | POA: Diagnosis not present

## 2018-08-03 DIAGNOSIS — E7800 Pure hypercholesterolemia, unspecified: Secondary | ICD-10-CM | POA: Diagnosis not present

## 2018-08-03 DIAGNOSIS — E039 Hypothyroidism, unspecified: Secondary | ICD-10-CM | POA: Diagnosis not present

## 2018-08-03 DIAGNOSIS — J449 Chronic obstructive pulmonary disease, unspecified: Secondary | ICD-10-CM | POA: Diagnosis not present

## 2018-08-03 DIAGNOSIS — Z0000 Encounter for general adult medical examination without abnormal findings: Secondary | ICD-10-CM | POA: Diagnosis not present

## 2018-08-03 DIAGNOSIS — I10 Essential (primary) hypertension: Secondary | ICD-10-CM | POA: Diagnosis not present

## 2018-08-06 DIAGNOSIS — E7800 Pure hypercholesterolemia, unspecified: Secondary | ICD-10-CM | POA: Diagnosis not present

## 2018-08-06 DIAGNOSIS — I10 Essential (primary) hypertension: Secondary | ICD-10-CM | POA: Diagnosis not present

## 2018-08-06 DIAGNOSIS — E039 Hypothyroidism, unspecified: Secondary | ICD-10-CM | POA: Diagnosis not present

## 2018-08-11 ENCOUNTER — Other Ambulatory Visit: Payer: Self-pay

## 2018-08-11 DIAGNOSIS — Z20822 Contact with and (suspected) exposure to covid-19: Secondary | ICD-10-CM

## 2018-08-13 LAB — NOVEL CORONAVIRUS, NAA: SARS-CoV-2, NAA: NOT DETECTED

## 2018-09-01 DIAGNOSIS — I10 Essential (primary) hypertension: Secondary | ICD-10-CM | POA: Diagnosis not present

## 2018-09-17 ENCOUNTER — Ambulatory Visit: Payer: Self-pay | Admitting: General Surgery

## 2018-09-17 DIAGNOSIS — K4091 Unilateral inguinal hernia, without obstruction or gangrene, recurrent: Secondary | ICD-10-CM | POA: Diagnosis not present

## 2018-09-17 NOTE — H&P (Signed)
  History of Present Illness The patient is a 78 year old male who presents with an inguinal hernia. Chief Complaint: Recurrent right inguinal hernia  Patient is a 78 year old male who previously underwent open right inguinal hernia repair with mesh by Dr. Grandville Silos in January 2020. Patient states that he noticed a bulge in recurrence approximate 46 weeks ago. He states that he has some discomfort and pain at that time. Patient had a hernia truss and is been using that successfully. He states that with a truss he has no pain.  Patient's had no signs or symptoms of incarceration or strangulation. Patient does state that on his previous operation he was allergic to the chlorhexidine prep, had a rash postoperatively.    Medication History  hydroCHLOROthiazide (12.5MG  Capsule, Oral) Active. AmLODIPine Besylate (5MG  Tablet, Oral) Active. Levothyroxine Sodium (100MCG Tablet, Oral) Active. Simvastatin (40MG  Tablet, Oral) Active. Symbicort (160-4.5MCG/ACT Aerosol, Inhalation) Active. Medications Reconciled    Review of Systems  All other systems negative    Physical Exam  The physical exam findings are as follows: Note: Constitutional: No acute distress, conversant, appears stated age  Eyes: Anicteric sclerae, moist conjunctiva, no lid lag  Neck: No thyromegaly, trachea midline, no cervical lymphadenopathy  Lungs: Clear to auscultation biilaterally, normal respiratory effot  Cardiovascular: regular rate & rhythm, no murmurs, no peripheal edema, pedal pulses 2+  GI: Soft, no masses or hepatosplenomegaly, non-tender to palpation  MSK: Normal gait, no clubbing cyanosis, edema  Skin: No rashes, palpation reveals normal skin turgor  Psychiatric: Appropriate judgment and insight, oriented to person, place, and time  Abdomen Inspection Hernias - Inguinal hernia - Reducible - Right.    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  1. The patient will like to proceed to the operating room for laparoscopic right inguinal hernia repair with mesh.  2. I discussed with the patient the signs and symptoms of incarceration and strangulation and the need to proceed to the ER should they occur.  3. I discussed with the patient the risks and benefits of the procedure to include but not limited to: Infection, bleeding, damage to surrounding structures, possible need for further surgery, possible nerve pain, and possible recurrence. The patient was understanding and wishes to proceed.

## 2018-09-17 NOTE — H&P (View-Only) (Signed)
  History of Present Illness The patient is a 78 year old male who presents with an inguinal hernia. Chief Complaint: Recurrent right inguinal hernia  Patient is a 78 year old male who previously underwent open right inguinal hernia repair with mesh by Dr. Grandville Silos in January 2020. Patient states that he noticed a bulge in recurrence approximate 46 weeks ago. He states that he has some discomfort and pain at that time. Patient had a hernia truss and is been using that successfully. He states that with a truss he has no pain.  Patient's had no signs or symptoms of incarceration or strangulation. Patient does state that on his previous operation he was allergic to the chlorhexidine prep, had a rash postoperatively.    Medication History  hydroCHLOROthiazide (12.5MG  Capsule, Oral) Active. AmLODIPine Besylate (5MG  Tablet, Oral) Active. Levothyroxine Sodium (100MCG Tablet, Oral) Active. Simvastatin (40MG  Tablet, Oral) Active. Symbicort (160-4.5MCG/ACT Aerosol, Inhalation) Active. Medications Reconciled    Review of Systems  All other systems negative    Physical Exam  The physical exam findings are as follows: Note: Constitutional: No acute distress, conversant, appears stated age  Eyes: Anicteric sclerae, moist conjunctiva, no lid lag  Neck: No thyromegaly, trachea midline, no cervical lymphadenopathy  Lungs: Clear to auscultation biilaterally, normal respiratory effot  Cardiovascular: regular rate & rhythm, no murmurs, no peripheal edema, pedal pulses 2+  GI: Soft, no masses or hepatosplenomegaly, non-tender to palpation  MSK: Normal gait, no clubbing cyanosis, edema  Skin: No rashes, palpation reveals normal skin turgor  Psychiatric: Appropriate judgment and insight, oriented to person, place, and time  Abdomen Inspection Hernias - Inguinal hernia - Reducible - Right.    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  1. The patient will like to proceed to the operating room for laparoscopic right inguinal hernia repair with mesh.  2. I discussed with the patient the signs and symptoms of incarceration and strangulation and the need to proceed to the ER should they occur.  3. I discussed with the patient the risks and benefits of the procedure to include but not limited to: Infection, bleeding, damage to surrounding structures, possible need for further surgery, possible nerve pain, and possible recurrence. The patient was understanding and wishes to proceed.

## 2018-10-13 ENCOUNTER — Encounter (HOSPITAL_COMMUNITY): Payer: Self-pay

## 2018-10-13 ENCOUNTER — Other Ambulatory Visit (HOSPITAL_COMMUNITY)
Admission: RE | Admit: 2018-10-13 | Discharge: 2018-10-13 | Disposition: A | Discharge status: Home / Self Care | Payer: Medicare HMO | Source: Ambulatory Visit | Attending: General Surgery | Admitting: General Surgery

## 2018-10-13 ENCOUNTER — Other Ambulatory Visit: Payer: Self-pay

## 2018-10-13 ENCOUNTER — Encounter (HOSPITAL_COMMUNITY)
Admission: RE | Admit: 2018-10-13 | Discharge: 2018-10-13 | Disposition: A | Discharge status: Home / Self Care | Payer: Medicare HMO | Source: Ambulatory Visit | Attending: General Surgery | Admitting: General Surgery

## 2018-10-13 DIAGNOSIS — Z01818 Encounter for other preprocedural examination: Secondary | ICD-10-CM | POA: Insufficient documentation

## 2018-10-13 DIAGNOSIS — Z20828 Contact with and (suspected) exposure to other viral communicable diseases: Secondary | ICD-10-CM | POA: Diagnosis not present

## 2018-10-13 DIAGNOSIS — Z01810 Encounter for preprocedural cardiovascular examination: Secondary | ICD-10-CM | POA: Insufficient documentation

## 2018-10-13 DIAGNOSIS — K4091 Unilateral inguinal hernia, without obstruction or gangrene, recurrent: Secondary | ICD-10-CM | POA: Insufficient documentation

## 2018-10-13 DIAGNOSIS — Z01812 Encounter for preprocedural laboratory examination: Secondary | ICD-10-CM | POA: Insufficient documentation

## 2018-10-13 DIAGNOSIS — I493 Ventricular premature depolarization: Secondary | ICD-10-CM | POA: Insufficient documentation

## 2018-10-13 LAB — CBC
HCT: 45.3 % (ref 39.0–52.0)
Hemoglobin: 15 g/dL (ref 13.0–17.0)
MCH: 29.9 pg (ref 26.0–34.0)
MCHC: 33.1 g/dL (ref 30.0–36.0)
MCV: 90.4 fL (ref 80.0–100.0)
Platelets: 258 10*3/uL (ref 150–400)
RBC: 5.01 MIL/uL (ref 4.22–5.81)
RDW: 12.1 % (ref 11.5–15.5)
WBC: 6.5 10*3/uL (ref 4.0–10.5)
nRBC: 0 % (ref 0.0–0.2)

## 2018-10-13 LAB — BASIC METABOLIC PANEL
Anion gap: 10 (ref 5–15)
BUN: 19 mg/dL (ref 8–23)
CO2: 26 mmol/L (ref 22–32)
Calcium: 9.9 mg/dL (ref 8.9–10.3)
Chloride: 105 mmol/L (ref 98–111)
Creatinine, Ser: 1.31 mg/dL — ABNORMAL HIGH (ref 0.61–1.24)
GFR calc Af Amer: >60 mL/min (ref >60)
GFR calc non Af Amer: 52 mL/min — ABNORMAL LOW (ref >60)
Glucose, Bld: 104 mg/dL — ABNORMAL HIGH (ref 70–99)
Potassium: 4.1 mmol/L (ref 3.5–5.1)
Sodium: 141 mmol/L (ref 135–145)

## 2018-10-13 NOTE — Pre-Procedure Instructions (Addendum)
Brandon Sanford  10/13/2018     Your procedure is scheduled on Friday, September 25.  Report to [facility], Main Entrance or Entrance "A" at 11:15 AM                         Your surgery or procedure is scheduled for 1:15 PM    Call this number if you have problems the morning of surgery: [phone]  This is the number for the Pre- Surgical Desk.         For any other questions, please call [phone], Monday - Friday 8 AM - 4 PM.   Remember:  Do not eat or drink after midnight Thursday, September 24    Take these medicines the morning of surgery with A SIP OF WATER :  amLODipine (NORVASC)              budesonide-formoterol (SYMBICORT) inhaler             levothyroxine (SYNTHROID, LEVOTHROID)               Take/Use if needed:                Polyethyl Glycol-Propyl Glycol (SYSTANE)                 TUMS   Special instructions:  [facility]- Preparing For Surgery  Before surgery, you can play an important role. Because skin is not sterile, your skin needs to be as free of germs as possible. You can reduce the number of germs on your skin by washing with CHG (chlorahexidine gluconate) Soap before surgery.  CHG is an antiseptic cleaner which kills germs and bonds with the skin to continue killing germs even after washing.    Oral Hygiene is also important to reduce your risk of infection.  Remember - BRUSH YOUR TEETH THE MORNING OF SURGERY WITH YOUR REGULAR TOOTHPASTE  Please do not use if you have an allergy to CHG or antibacterial soaps. If your skin becomes reddened/irritated stop using the CHG.  Do not shave (including legs and underarms) for at least 48 hours prior to first CHG shower. It is OK to shave your face.  Please follow these instructions carefully.   1. Shower the NIGHT BEFORE SURGERY and the MORNING OF SURGERY with CHG.   2. If you chose to wash your hair, wash your hair first as usual with your normal shampoo.  3. After you shampoo, wash your face  and private area with the soap you use at home, then rinse your hair and body thoroughly to remove the shampoo and soap.  4. Use CHG as you would any other liquid soap. You can apply CHG directly to the skin and wash gently with a scrungie or a clean washcloth.   Apply the CHG Soap to your body ONLY FROM THE NECK DOWN.  Do not use on open wounds or open sores. Avoid contact with your eyes, ears, mouth and genitals (private parts).    5. Wash thoroughly, paying special attention to the area where your surgery will be performed.  6. Thoroughly rinse your body with warm water from the neck down.  7. DO NOT shower/wash with your normal soap after using and rinsing off the CHG Soap.  8. Pat yourself dry with a CLEAN TOWEL.  9. Wear CLEAN PAJAMAS to bed the night before surgery, wear comfortable clothes the morning of surgery  10. Place CLEAN  SHEETS on your bed the night of your first shower and DO NOT SLEEP WITH PETS.  Day of Surgery: Shower as instructed above. Do not wear lotions, powders, or perfumes, or deodorant. Please wear clean clothes to the hospital/surgery center.   Remember to brush your teeth WITH YOUR REGULAR TOOTHPAS  Do not wear jewelry, make-up or nail polish.  Men may shave face and neck.  Do not bring valuables to the hospital.  [facility] is not responsible for any belongings or valuables.  Contacts, dentures or bridgework may not be worn into surgery.  Leave your suitcase in the car.  After surgery it may be brought to your room.  For patients admitted to the hospital, discharge time will be determined by your treatment team.  Patients discharged the day of surgery will not be allowed to drive home.   Please read over the following fact sheets that you were given:  Coughing and Deep Breathing and Pain Booklet, Surgical Site Infections

## 2018-10-13 NOTE — Progress Notes (Signed)
PCP - Dr Alroy Dust- Eagel  Cardiologist - no  Chest x-ray - na  EKG - 10/13/2018  Stress Test - no  ECHO - no  Cardiac Cath - no  Sleep Study - no CPAP - no  LABS-CBC, BMP  ASA-no  ERAS-no  HA1C-na Fasting Blood Sugar - na Checks Blood Sugar __na___ times a day  Anesthesia-  Pt denies having chest pain, sob, or fever at this time. All instructions explained to the pt, with a verbal understanding of the material. Pt agrees to go over the instructions while at home for a better understanding. Pt also instructed to self quarantine after being tested for COVID-19. The opportunity to ask questions was provided.

## 2018-10-14 LAB — NOVEL CORONAVIRUS, NAA (HOSP ORDER, SEND-OUT TO REF LAB; TAT 18-24 HRS): SARS-CoV-2, NAA: NOT DETECTED

## 2018-10-16 ENCOUNTER — Ambulatory Visit (HOSPITAL_COMMUNITY): Payer: Medicare HMO | Admitting: Certified Registered"

## 2018-10-16 ENCOUNTER — Other Ambulatory Visit: Payer: Self-pay

## 2018-10-16 ENCOUNTER — Encounter (HOSPITAL_COMMUNITY)
Admission: RE | Disposition: A | Discharge status: Home / Self Care | Payer: Self-pay | Source: Home / Self Care | Attending: General Surgery

## 2018-10-16 ENCOUNTER — Encounter (HOSPITAL_COMMUNITY): Payer: Self-pay | Admitting: General Practice

## 2018-10-16 ENCOUNTER — Ambulatory Visit (HOSPITAL_COMMUNITY)
Admission: RE | Admit: 2018-10-16 | Discharge: 2018-10-16 | Disposition: A | Discharge status: Home / Self Care | Payer: Medicare HMO | Attending: General Surgery | Admitting: General Surgery

## 2018-10-16 DIAGNOSIS — K4190 Unilateral femoral hernia, without obstruction or gangrene, not specified as recurrent: Secondary | ICD-10-CM | POA: Insufficient documentation

## 2018-10-16 DIAGNOSIS — I10 Essential (primary) hypertension: Secondary | ICD-10-CM | POA: Diagnosis not present

## 2018-10-16 DIAGNOSIS — E039 Hypothyroidism, unspecified: Secondary | ICD-10-CM | POA: Insufficient documentation

## 2018-10-16 DIAGNOSIS — Z87891 Personal history of nicotine dependence: Secondary | ICD-10-CM | POA: Insufficient documentation

## 2018-10-16 DIAGNOSIS — J449 Chronic obstructive pulmonary disease, unspecified: Secondary | ICD-10-CM | POA: Insufficient documentation

## 2018-10-16 DIAGNOSIS — K4090 Unilateral inguinal hernia, without obstruction or gangrene, not specified as recurrent: Secondary | ICD-10-CM | POA: Diagnosis not present

## 2018-10-16 DIAGNOSIS — Z79899 Other long term (current) drug therapy: Secondary | ICD-10-CM | POA: Diagnosis not present

## 2018-10-16 DIAGNOSIS — M1990 Unspecified osteoarthritis, unspecified site: Secondary | ICD-10-CM | POA: Diagnosis not present

## 2018-10-16 DIAGNOSIS — K219 Gastro-esophageal reflux disease without esophagitis: Secondary | ICD-10-CM | POA: Diagnosis not present

## 2018-10-16 DIAGNOSIS — Z7951 Long term (current) use of inhaled steroids: Secondary | ICD-10-CM | POA: Insufficient documentation

## 2018-10-16 DIAGNOSIS — Z79890 Hormone replacement therapy (postmenopausal): Secondary | ICD-10-CM | POA: Insufficient documentation

## 2018-10-16 DIAGNOSIS — K4091 Unilateral inguinal hernia, without obstruction or gangrene, recurrent: Secondary | ICD-10-CM | POA: Diagnosis not present

## 2018-10-16 HISTORY — PX: INGUINAL HERNIA REPAIR: SHX194

## 2018-10-16 HISTORY — PX: INSERTION OF MESH: SHX5868

## 2018-10-16 SURGERY — REPAIR, HERNIA, INGUINAL, LAPAROSCOPIC
Anesthesia: General | Site: Abdomen | Laterality: Right

## 2018-10-16 MED ORDER — OXYCODONE HCL 5 MG PO TABS
ORAL_TABLET | ORAL | Status: AC
  Filled 2018-10-16: qty 1

## 2018-10-16 MED ORDER — ONDANSETRON HCL 4 MG/2ML IJ SOLN
INTRAMUSCULAR | Status: DC | PRN
  Administered 2018-10-16: 4 mg via INTRAVENOUS

## 2018-10-16 MED ORDER — OXYCODONE HCL 5 MG/5ML PO SOLN: 5.0000 mg | Freq: Once | ORAL | Status: AC | PRN

## 2018-10-16 MED ORDER — LACTATED RINGERS IV SOLN: INTRAVENOUS | Status: DC

## 2018-10-16 MED ORDER — 0.9 % SODIUM CHLORIDE (POUR BTL) OPTIME
TOPICAL | Status: DC | PRN
  Administered 2018-10-16: 1000 mL

## 2018-10-16 MED ORDER — SODIUM CHLORIDE 0.9 % IV SOLN
INTRAVENOUS | Status: DC | PRN
  Administered 2018-10-16: 13:00:00 20 ug/min via INTRAVENOUS

## 2018-10-16 MED ORDER — PHENYLEPHRINE 40 MCG/ML (10ML) SYRINGE FOR IV PUSH (FOR BLOOD PRESSURE SUPPORT)
PREFILLED_SYRINGE | INTRAVENOUS | Status: DC | PRN
  Administered 2018-10-16: 80 ug via INTRAVENOUS

## 2018-10-16 MED ORDER — MIDAZOLAM HCL 2 MG/2ML IJ SOLN
INTRAMUSCULAR | Status: AC
  Filled 2018-10-16: qty 2

## 2018-10-16 MED ORDER — FENTANYL CITRATE (PF) 100 MCG/2ML IJ SOLN
INTRAMUSCULAR | Status: AC
  Filled 2018-10-16: qty 2

## 2018-10-16 MED ORDER — ACETAMINOPHEN 500 MG PO TABS
1000.0000 mg | ORAL_TABLET | ORAL | Status: AC
  Administered 2018-10-16: 1000 mg via ORAL
  Filled 2018-10-16: qty 2

## 2018-10-16 MED ORDER — FENTANYL CITRATE (PF) 250 MCG/5ML IJ SOLN
INTRAMUSCULAR | Status: DC | PRN
  Administered 2018-10-16 (×3): 50 ug via INTRAVENOUS

## 2018-10-16 MED ORDER — LACTATED RINGERS IV SOLN
INTRAVENOUS | Status: DC
  Administered 2018-10-16: 12:00:00 via INTRAVENOUS

## 2018-10-16 MED ORDER — ROCURONIUM BROMIDE 10 MG/ML (PF) SYRINGE
PREFILLED_SYRINGE | INTRAVENOUS | Status: AC
  Filled 2018-10-16: qty 10

## 2018-10-16 MED ORDER — CEFAZOLIN SODIUM-DEXTROSE 2-4 GM/100ML-% IV SOLN
2.0000 g | INTRAVENOUS | Status: AC
  Administered 2018-10-16: 13:00:00 2 g via INTRAVENOUS
  Filled 2018-10-16: qty 100

## 2018-10-16 MED ORDER — BUPIVACAINE HCL 0.25 % IJ SOLN
INTRAMUSCULAR | Status: DC | PRN
  Administered 2018-10-16: 6 mL

## 2018-10-16 MED ORDER — DEXAMETHASONE SODIUM PHOSPHATE 10 MG/ML IJ SOLN
INTRAMUSCULAR | Status: DC | PRN
  Administered 2018-10-16: 4 mg via INTRAVENOUS

## 2018-10-16 MED ORDER — BUPIVACAINE HCL (PF) 0.25 % IJ SOLN
INTRAMUSCULAR | Status: AC
  Filled 2018-10-16: qty 30

## 2018-10-16 MED ORDER — PROPOFOL 10 MG/ML IV BOLUS
INTRAVENOUS | Status: AC
  Filled 2018-10-16: qty 20

## 2018-10-16 MED ORDER — OXYCODONE HCL 5 MG PO TABS
5.0000 mg | ORAL_TABLET | Freq: Once | ORAL | Status: AC | PRN
  Administered 2018-10-16: 5 mg via ORAL

## 2018-10-16 MED ORDER — GABAPENTIN 300 MG PO CAPS
300.0000 mg | ORAL_CAPSULE | ORAL | Status: AC
  Administered 2018-10-16: 300 mg via ORAL
  Filled 2018-10-16: qty 1

## 2018-10-16 MED ORDER — FENTANYL CITRATE (PF) 250 MCG/5ML IJ SOLN
INTRAMUSCULAR | Status: AC
  Filled 2018-10-16: qty 5

## 2018-10-16 MED ORDER — TRAMADOL HCL 50 MG PO TABS: 50.0000 mg | ORAL_TABLET | Freq: Four times a day (QID) | ORAL | 0 refills | Status: AC | PRN

## 2018-10-16 MED ORDER — ROCURONIUM BROMIDE 10 MG/ML (PF) SYRINGE
PREFILLED_SYRINGE | INTRAVENOUS | Status: DC | PRN
  Administered 2018-10-16: 70 mg via INTRAVENOUS

## 2018-10-16 MED ORDER — ONDANSETRON HCL 4 MG/2ML IJ SOLN: 4.0000 mg | Freq: Once | INTRAMUSCULAR | Status: DC | PRN

## 2018-10-16 MED ORDER — PROPOFOL 10 MG/ML IV BOLUS
INTRAVENOUS | Status: DC | PRN
  Administered 2018-10-16: 150 mg via INTRAVENOUS

## 2018-10-16 MED ORDER — ONDANSETRON HCL 4 MG/2ML IJ SOLN
INTRAMUSCULAR | Status: AC
  Filled 2018-10-16: qty 2

## 2018-10-16 MED ORDER — SUGAMMADEX SODIUM 200 MG/2ML IV SOLN
INTRAVENOUS | Status: DC | PRN
  Administered 2018-10-16: 160 mg via INTRAVENOUS

## 2018-10-16 MED ORDER — DEXAMETHASONE SODIUM PHOSPHATE 10 MG/ML IJ SOLN
INTRAMUSCULAR | Status: AC
  Filled 2018-10-16: qty 1

## 2018-10-16 MED ORDER — FENTANYL CITRATE (PF) 100 MCG/2ML IJ SOLN
25.0000 ug | INTRAMUSCULAR | Status: DC | PRN
  Administered 2018-10-16: 50 ug via INTRAVENOUS
  Administered 2018-10-16: 25 ug via INTRAVENOUS

## 2018-10-16 MED ORDER — LIDOCAINE 2% (20 MG/ML) 5 ML SYRINGE
INTRAMUSCULAR | Status: AC
  Filled 2018-10-16: qty 5

## 2018-10-16 MED ORDER — LIDOCAINE 2% (20 MG/ML) 5 ML SYRINGE
INTRAMUSCULAR | Status: DC | PRN
  Administered 2018-10-16: 80 mg via INTRAVENOUS

## 2018-10-16 SURGICAL SUPPLY — 41 items
CANISTER SUCT 3000ML PPV (MISCELLANEOUS) IMPLANT
COVER SURGICAL LIGHT HANDLE (MISCELLANEOUS) ×3 IMPLANT
COVER WAND RF STERILE (DRAPES) ×1 IMPLANT
DEFOGGER SCOPE WARMER CLEARIFY (MISCELLANEOUS) IMPLANT
DERMABOND ADVANCED (GAUZE/BANDAGES/DRESSINGS) ×1
DERMABOND ADVANCED .7 DNX12 (GAUZE/BANDAGES/DRESSINGS) ×2 IMPLANT
DISSECTOR BLUNT TIP ENDO 5MM (MISCELLANEOUS) IMPLANT
ELECT REM PT RETURN 9FT ADLT (ELECTROSURGICAL) ×3
ELECTRODE REM PT RTRN 9FT ADLT (ELECTROSURGICAL) ×2 IMPLANT
ENDOLOOP SUT PDS II  0 18 (SUTURE) ×1
ENDOLOOP SUT PDS II 0 18 (SUTURE) ×1 IMPLANT
GLOVE BIO SURGEON STRL SZ7.5 (GLOVE) ×3 IMPLANT
GOWN STRL REUS W/ TWL LRG LVL3 (GOWN DISPOSABLE) ×4 IMPLANT
GOWN STRL REUS W/ TWL XL LVL3 (GOWN DISPOSABLE) ×2 IMPLANT
GOWN STRL REUS W/TWL LRG LVL3 (GOWN DISPOSABLE) ×2
GOWN STRL REUS W/TWL XL LVL3 (GOWN DISPOSABLE) ×1
KIT BASIN OR (CUSTOM PROCEDURE TRAY) ×3 IMPLANT
KIT TURNOVER KIT B (KITS) ×3 IMPLANT
MESH 3DMAX 5X7 RT XLRG (Mesh General) ×2 IMPLANT
NDL INSUFFLATION 14GA 120MM (NEEDLE) IMPLANT
NEEDLE INSUFFLATION 14GA 120MM (NEEDLE) ×3 IMPLANT
NS IRRIG 1000ML POUR BTL (IV SOLUTION) ×3 IMPLANT
PAD ARMBOARD 7.5X6 YLW CONV (MISCELLANEOUS) ×6 IMPLANT
RELOAD STAPLE 4.0 BLU F/HERNIA (INSTRUMENTS) ×1 IMPLANT
RELOAD STAPLE 4.8 BLK F/HERNIA (STAPLE) IMPLANT
RELOAD STAPLE HERNIA 4.0 BLUE (INSTRUMENTS) ×3 IMPLANT
RELOAD STAPLE HERNIA 4.8 BLK (STAPLE) IMPLANT
SCISSORS LAP 5X35 DISP (ENDOMECHANICALS) ×3 IMPLANT
SET IRRIG TUBING LAPAROSCOPIC (IRRIGATION / IRRIGATOR) IMPLANT
SET TUBE SMOKE EVAC HIGH FLOW (TUBING) ×3 IMPLANT
STAPLER HERNIA 12 8.5 360D (INSTRUMENTS) ×3 IMPLANT
SUT MNCRL AB 4-0 PS2 18 (SUTURE) ×3 IMPLANT
SUT VIC AB 1 CT1 27 (SUTURE)
SUT VIC AB 1 CT1 27XBRD ANBCTR (SUTURE) IMPLANT
SYRINGE TOOMEY DISP (SYRINGE) ×3 IMPLANT
TOWEL GREEN STERILE (TOWEL DISPOSABLE) ×3 IMPLANT
TOWEL GREEN STERILE FF (TOWEL DISPOSABLE) ×3 IMPLANT
TRAY FOLEY W/BAG SLVR 14FR (SET/KITS/TRAYS/PACK) ×3 IMPLANT
TRAY LAPAROSCOPIC MC (CUSTOM PROCEDURE TRAY) ×3 IMPLANT
TROCAR XCEL 12X100 BLDLESS (ENDOMECHANICALS) ×3 IMPLANT
WATER STERILE IRR 1000ML POUR (IV SOLUTION) ×3 IMPLANT

## 2018-10-16 NOTE — Anesthesia Postprocedure Evaluation (Signed)
Anesthesia Post Note  Patient: Brandon Sanford  Procedure(s) Performed: LAPAROSCOPIC RIGHT INGUINAL HERNIA (Right Abdomen) Insertion Of Mesh (N/A Inguinal)     Patient location during evaluation: PACU Anesthesia Type: General Level of consciousness: awake and alert Pain management: pain level controlled Vital Signs Assessment: post-procedure vital signs reviewed and stable Respiratory status: spontaneous breathing, nonlabored ventilation, respiratory function stable and patient connected to nasal cannula oxygen Cardiovascular status: blood pressure returned to baseline and stable Postop Assessment: no apparent nausea or vomiting Anesthetic complications: no    Last Vitals:  Vitals:   10/16/18 1450 10/16/18 1505  BP: (!) 161/75 134/69  Pulse: 60 (!) 56  Resp: 11 11  Temp:    SpO2: 96% 91%    Last Pain:  Vitals:   10/16/18 1450  TempSrc:   PainSc: 7                  Shayli Altemose L Nyimah Shadduck

## 2018-10-16 NOTE — Op Note (Signed)
10/16/2018  2:02 PM  PATIENT:  Brandon Sanford  78 y.o. male  PRE-OPERATIVE DIAGNOSIS:  RECURRENT RIGHT INGUINAL HERNIA  POST-OPERATIVE DIAGNOSIS:  RIGHT FEMORAL HERNIA  PROCEDURE:  Procedure(s): LAPAROSCOPIC RIGHT INGUINAL HERNIA (Right) Insertion Of Mesh (N/A)  SURGEON:  Surgeon(s) and Role:    Ralene Ok, MD - Primary     ANESTHESIA:   local and general  EBL:  5 mL   BLOOD ADMINISTERED:none  DRAINS: none   LOCAL MEDICATIONS USED:  BUPIVICAINE   SPECIMEN:  No Specimen  DISPOSITION OF SPECIMEN:  N/A  COUNTS:  YES  TOURNIQUET:  * No tourniquets in log *  DICTATION: .Dragon Dictation  Counts: reported as correct x 2  Findings:  The patient had a small right FEMORAL hernia, no indirect or direct component.  There was a small hole in the peritoneum that was ligated using a 0PDS Endoloop  Indications for procedure:  The patient is a 78 year old male with a right hernia for several months. Patient complained of symptomatology to his right inguinal area and thought to be a recurrence of a previous hernia. The patient was taken back for elective inguinal hernia repair.  Details of the procedure: The patient was taken back to the operating room. The patient was placed in supine position with bilateral SCDs in place.  The patient was prepped and draped in the usual sterile fashion.  After appropriate anitbiotics were confirmed, a time-out was confirmed and all facts were verified.  0.25% Marcaine was used to infiltrate the umbilical area. A 11-blade was used to cut down the skin and blunt dissection was used to get the anterior fashion.  The anterior fascia was incised approximately 1 cm and the muscles were retracted laterally. Blunt dissection was then used to create a space in the preperitoneal area. At this time a 10 mm camera was then introduced into the space and advanced the pubic tubercle and a 12 mm trocar was placed over this and insufflation was started.  At this  time and space was created from medial to laterally the preperitoneal space.  Cooper's ligament was initially cleaned off.  There appeared to be a small fatty femoral hernia.  This was reduced with some effort.  The femoral hernia was completely reduced.  The spermatic cord was dissected circumferentially and the peritoneum dissected back the level of the umbilicus.  There was a small tear into the hernia sac. A Veress needle right upper quadrant to help evacuate the intraperitoneal air.    Once the hernia sac was taken down to approximately the umbilicus a Bard 3D Max mesh, size: Rachelle Hora, was  introduced into the preperitoneal space.  The mesh was brought over to cover the femoral, direct, and indirect hernia spaces.  This was anchored into place and secured to Cooper's ligament with 4.48mm staples from a [place] hernia stapler. It was anchored to the anterior abdominal wall with 4.8 mm staples. The peritoneum was seen lying posterior to the mesh. There was no staples placed laterally. The insufflation was evacuated and the peritoneum was seen posterior to the mesh. The trochars were removed. The anterior fascia was reapproximated using #1 Vicryl on a UR- 6.  Intra-abdominal air was evacuated and the Veress needle removed. The skin was reapproximated using 4-0 Monocryl subcuticular fashion and dressed with Dermbond.  The patient was awakened from general anesthesia and taken to recovery in stable condition.  PLAN OF CARE: Discharge to home after PACU  PATIENT DISPOSITION:  PACU - hemodynamically  stable.   Delay start of Pharmacological VTE agent (>24hrs) due to surgical blood loss or risk of bleeding: not applicable

## 2018-10-16 NOTE — Transfer of Care (Signed)
Immediate Anesthesia Transfer of Care Note  Patient: Brandon Sanford  Procedure(s) Performed: LAPAROSCOPIC RIGHT INGUINAL HERNIA (Right Abdomen) Insertion Of Mesh (N/A Inguinal)  Patient Location: PACU  Anesthesia Type:General  Level of Consciousness: awake, alert  and oriented  Airway & Oxygen Therapy: Patient Spontanous Breathing and Patient connected to nasal cannula oxygen  Post-op Assessment: Report given to RN, Post -op Vital signs reviewed and stable and Patient moving all extremities  Post vital signs: Reviewed and stable  Last Vitals:  Vitals Value Taken Time  BP 163/67 10/16/18 1419  Temp    Pulse 71 10/16/18 1420  Resp 14 10/16/18 1420  SpO2 100 % 10/16/18 1420  Vitals shown include unvalidated device data.  Last Pain:  Vitals:   10/16/18 1156  TempSrc:   PainSc: 0-No pain      Patients Stated Pain Goal: 3 ([id number])  Complications: No apparent anesthesia complications

## 2018-10-16 NOTE — Anesthesia Preprocedure Evaluation (Addendum)
Anesthesia Evaluation  Patient identified by MRN, date of birth, ID band Patient awake    Reviewed: Allergy & Precautions, NPO status , Patient's Chart, lab work & pertinent test results  History of Anesthesia Complications Negative for: history of anesthetic complications  Airway Mallampati: III  TM Distance: >3 FB Neck ROM: Full    Dental  (+) Dental Advisory Given, Upper Dentures   Pulmonary asthma , COPD,  COPD inhaler, former smoker,    Pulmonary exam normal        Cardiovascular hypertension, Pt. on medications (-) anginaNormal cardiovascular exam     Neuro/Psych negative neurological ROS  negative psych ROS   GI/Hepatic Neg liver ROS, GERD  Controlled,  Endo/Other  Hypothyroidism   Renal/GU negative Renal ROS     Musculoskeletal  (+) Arthritis ,   Abdominal   Peds  Hematology negative hematology ROS (+)   Anesthesia Other Findings   Reproductive/Obstetrics                            Anesthesia Physical Anesthesia Plan  ASA: III  Anesthesia Plan: General   Post-op Pain Management:    Induction: Intravenous  PONV Risk Score and Plan: 2 and Treatment may vary due to age or medical condition, Ondansetron and Dexamethasone  Airway Management Planned: Oral ETT  Additional Equipment: None  Intra-op Plan:   Post-operative Plan: Extubation in OR  Informed Consent: I have reviewed the patients History and Physical, chart, labs and discussed the procedure including the risks, benefits and alternatives for the proposed anesthesia with the patient or authorized representative who has indicated his/her understanding and acceptance.     Dental advisory given  Plan Discussed with: CRNA and Anesthesiologist  Anesthesia Plan Comments:        Anesthesia Quick Evaluation

## 2018-10-16 NOTE — Interval H&P Note (Signed)
History and Physical Interval Note:  10/16/2018 12:20 PM  Brandon Sanford  has presented today for surgery, with the diagnosis of RECURRENT RIGHT INGUINAL HERNIA.  The various methods of treatment have been discussed with the patient and family. After consideration of risks, benefits and other options for treatment, the patient has consented to  Procedure(s): LAPAROSCOPIC RIGHT INGUINAL HERNIA REPAIR WITH MESH (Right) as a surgical intervention.  The patient's history has been reviewed, patient examined, no change in status, stable for surgery.  I have reviewed the patient's chart and labs.  Questions were answered to the patient's satisfaction.     Ralene Ok

## 2018-10-16 NOTE — Discharge Instructions (Signed)
[facility] _______[facility] °INGUINAL HERNIA REPAIR: POST OP INSTRUCTIONS ° °Always review your discharge instruction sheet given to you by the facility where your surgery was performed. °IF YOU HAVE DISABILITY OR FAMILY LEAVE FORMS, YOU MUST BRING THEM TO THE OFFICE FOR PROCESSING.   °DO NOT GIVE THEM TO YOUR DOCTOR. ° °1. A  prescription for pain medication may be given to you upon discharge.  Take your pain medication as prescribed, if needed.  If narcotic pain medicine is not needed, then you may take acetaminophen (Tylenol) or ibuprofen (Advil) as needed. °2. Take your usually prescribed medications unless otherwise directed. °If you need a refill on your pain medication, please contact your pharmacy.  They will contact our office to request authorization. Prescriptions will not be filled after 5 pm or on week-ends. °3. You should follow a light diet the first 24 hours after arrival home, such as soup and crackers, etc.  Be sure to include lots of fluids daily.  Resume your normal diet the day after surgery. °4.Most patients will experience some swelling and bruising around the umbilicus or in the groin and scrotum.  Ice packs and reclining will help.  Swelling and bruising can take several days to resolve.  °6. It is common to experience some constipation if taking pain medication after surgery.  Increasing fluid intake and taking a stool softener (such as Colace) will usually help or prevent this problem from occurring.  A mild laxative (Milk of Magnesia or Miralax) should be taken according to package directions if there are no bowel movements after 48 hours. °7. Unless discharge instructions indicate otherwise, you may remove your bandages 24-48 hours after surgery, and you may shower at that time.  You may have steri-strips (small skin tapes) in place directly over the incision.  These strips should be left on the skin for 7-10 days.  If your surgeon used skin glue on the incision, you may  shower in 24 hours.  The glue will flake off over the next 2-3 weeks.  Any sutures or staples will be removed at the office during your follow-up visit. °8. ACTIVITIES:  You may resume regular (light) daily activities beginning the next day--such as daily self-care, walking, climbing stairs--gradually increasing activities as tolerated.  You may have sexual intercourse when it is comfortable.  Refrain from any heavy lifting or straining until approved by your doctor. ° °a.You may drive when you are no longer taking prescription pain medication, you can comfortably wear a seatbelt, and you can safely maneuver your car and apply brakes. °b.RETURN TO WORK:   °_____________________________________________ ° °9.You should see your doctor in the office for a follow-up appointment approximately 2-3 weeks after your surgery.  Make sure that you call for this appointment within a day or two after you arrive home to insure a convenient appointment time. °10.OTHER INSTRUCTIONS: _________________________ °   _____________________________________ ° °WHEN TO CALL YOUR DOCTOR: °1. Fever over 101.0 °2. Inability to urinate °3. Nausea and/or vomiting °4. Extreme swelling or bruising °5. Continued bleeding from incision. °6. Increased pain, redness, or drainage from the incision ° °The clinic staff is available to answer your questions during regular business hours.  Please don’t hesitate to call and ask to speak to one of the nurses for clinical concerns.  If you have a medical emergency, go to the nearest emergency room or call 911.  A surgeon from [facility] is always on call at the hospital ° ° °[address], [address], [place], [place]  [address] ? ° [address] [address], [place], [place]   [address] °[phone] ? [phone] ? FAX [phone] °Web site: [email or web address] ° °

## 2018-10-16 NOTE — Anesthesia Procedure Notes (Signed)
Procedure Name: Intubation Date/Time: 10/16/2018 1:07 PM Performed by: Amadeo Garnet, CRNA Pre-anesthesia Checklist: Patient identified, Emergency Drugs available, Suction available, Patient being monitored and Timeout performed Patient Re-evaluated:Patient Re-evaluated prior to induction Oxygen Delivery Method: Circle system utilized Preoxygenation: Pre-oxygenation with 100% oxygen Induction Type: IV induction Ventilation: Mask ventilation without difficulty and Oral airway inserted - appropriate to patient size Laryngoscope Size: Mac and 3 Grade View: Grade I Tube type: Oral Tube size: 7.5 mm Number of attempts: 1 Airway Equipment and Method: Stylet Placement Confirmation: ETT inserted through vocal cords under direct vision,  positive ETCO2 and breath sounds checked- equal and bilateral Secured at: 22 cm Tube secured with: Tape Dental Injury: Teeth and Oropharynx as per pre-operative assessment

## 2018-10-19 ENCOUNTER — Encounter (HOSPITAL_COMMUNITY): Payer: Self-pay | Admitting: General Surgery

## 2019-02-02 DIAGNOSIS — J449 Chronic obstructive pulmonary disease, unspecified: Secondary | ICD-10-CM | POA: Diagnosis not present

## 2019-02-02 DIAGNOSIS — I10 Essential (primary) hypertension: Secondary | ICD-10-CM | POA: Diagnosis not present

## 2019-02-02 DIAGNOSIS — E039 Hypothyroidism, unspecified: Secondary | ICD-10-CM | POA: Diagnosis not present

## 2019-02-02 DIAGNOSIS — E7800 Pure hypercholesterolemia, unspecified: Secondary | ICD-10-CM | POA: Diagnosis not present

## 2019-02-04 DIAGNOSIS — I10 Essential (primary) hypertension: Secondary | ICD-10-CM | POA: Diagnosis not present

## 2019-02-13 IMAGING — CT CT ABD-PELV W/ CM
2 of 5 series · 16 of 46 positions shown, 18 images · IV contrast (Omni 300)
Comparison: None.

CLINICAL DATA: Right inguinal hernia with pain, initial encounter

EXAM:
CT ABDOMEN AND PELVIS WITH CONTRAST
TECHNIQUE: Multidetector CT imaging of the abdomen and pelvis was performed
using the standard protocol following bolus administration of
intravenous contrast.
CONTRAST:  100mL 4R0HCP-MNN IOPAMIDOL (4R0HCP-MNN) INJECTION 61%

[Series 3: a/p w/ 5mm · axial · 0.80mm/px · z∈[+938,+1328]mm · 13 of 90 slices shown, 15 images]
[im 6/90  soft-tissue]
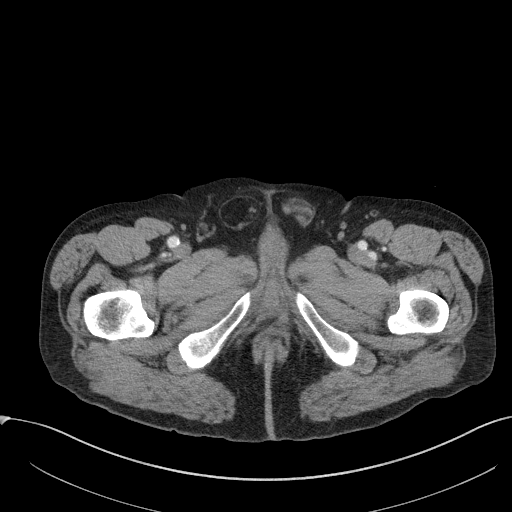
[im 6/90  bone]
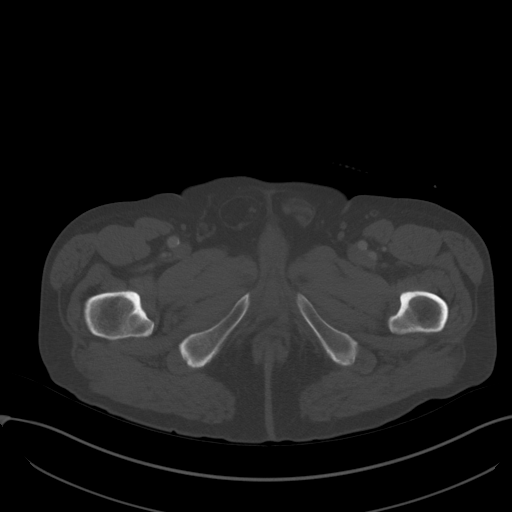
[im 12/90  soft-tissue]
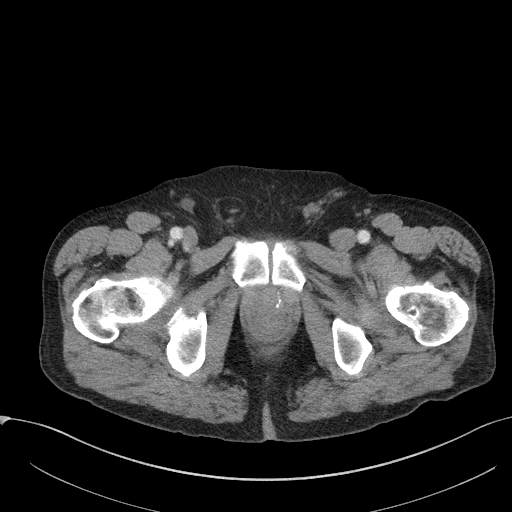
[im 17/90  soft-tissue]
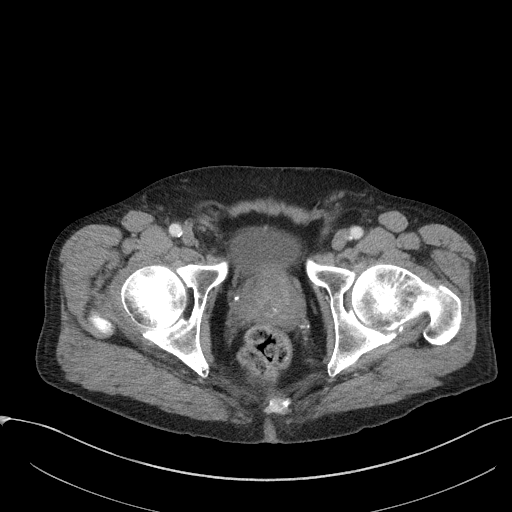
[im 28/90  soft-tissue]
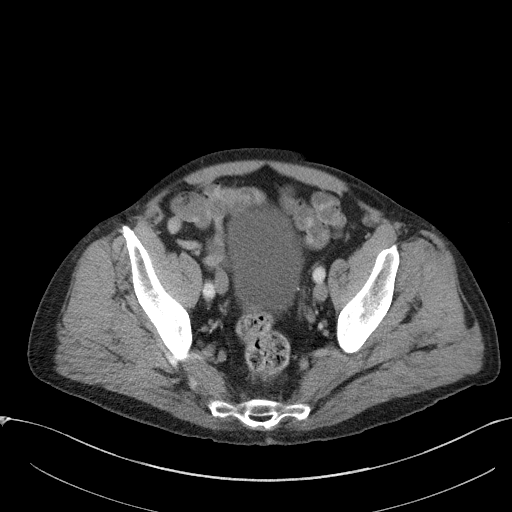
[im 34/90  soft-tissue]
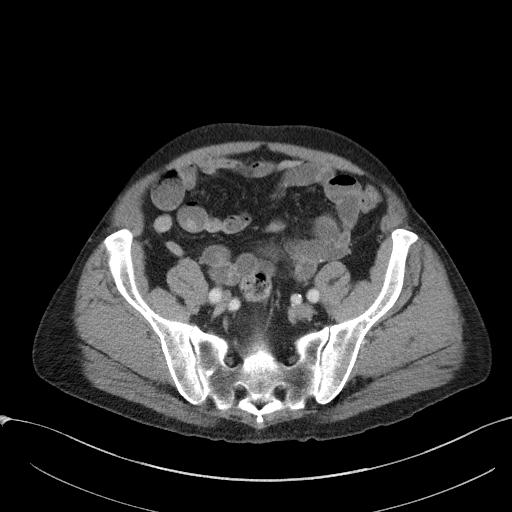
[im 39/90  soft-tissue]
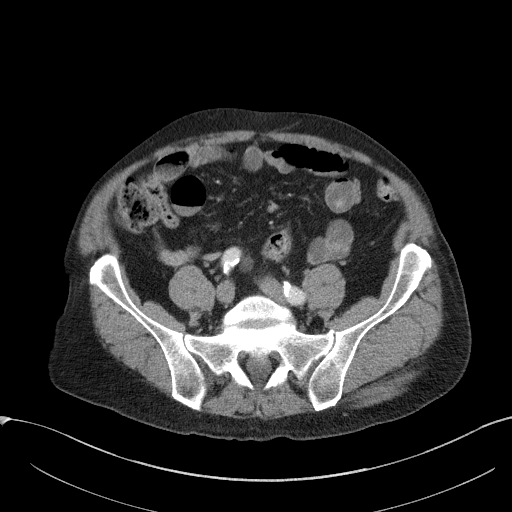
[im 45/90  soft-tissue]
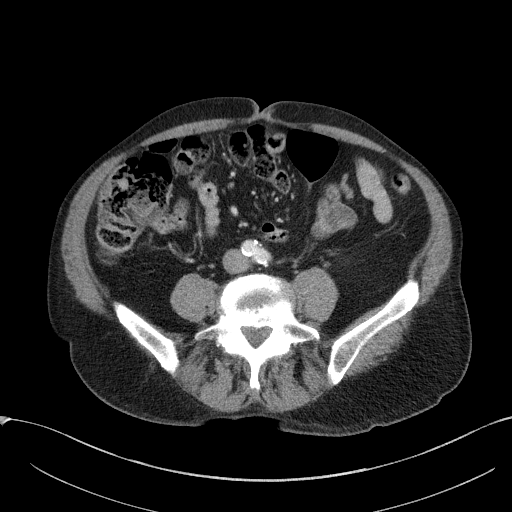
[im 51/90  soft-tissue]
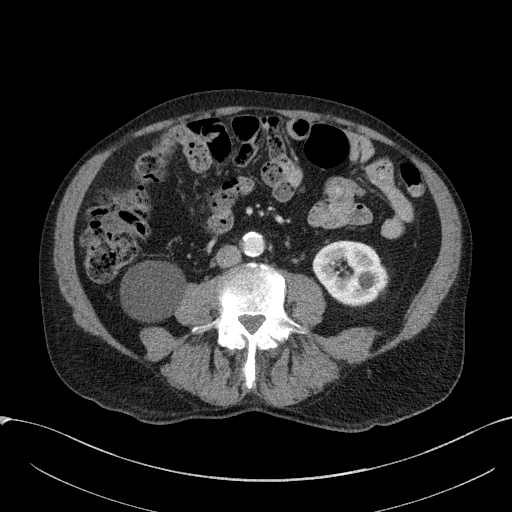
[im 56/90  soft-tissue]
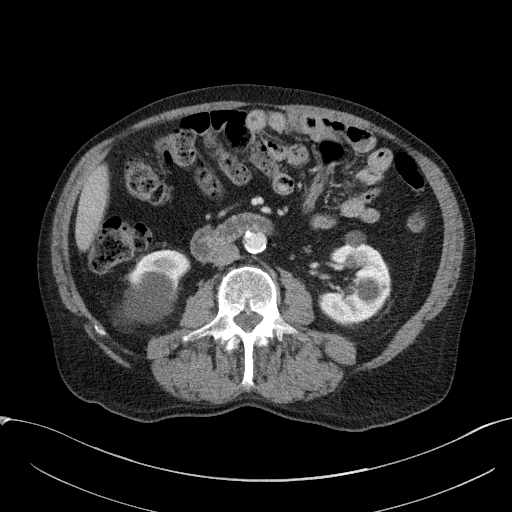
[im 56/90  bone]
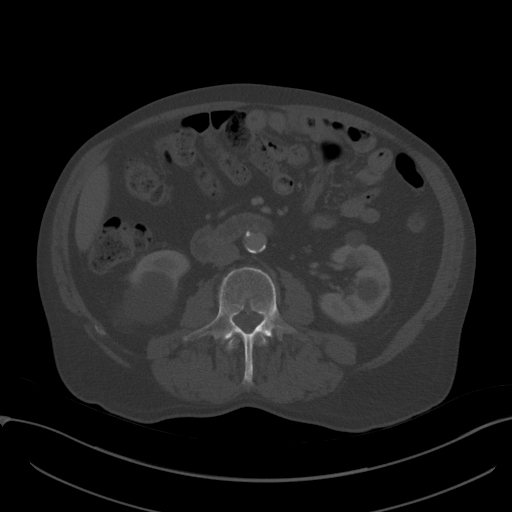
[im 62/90  soft-tissue]
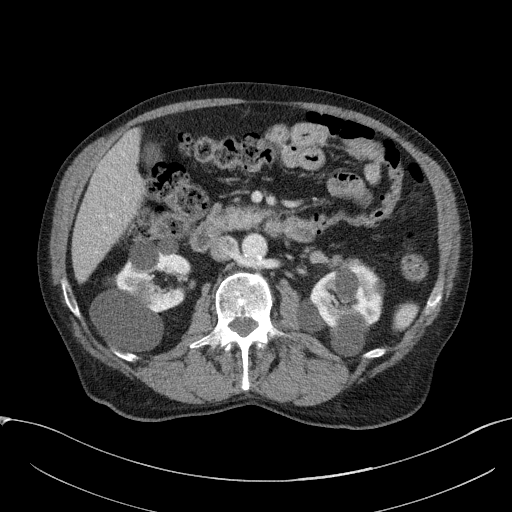
[im 73/90  soft-tissue]
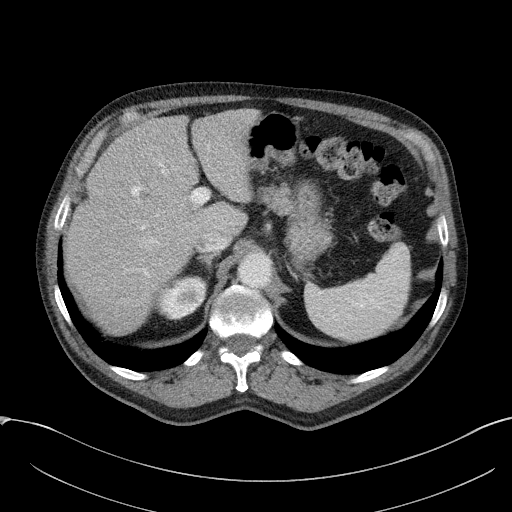
[im 78/90  soft-tissue]
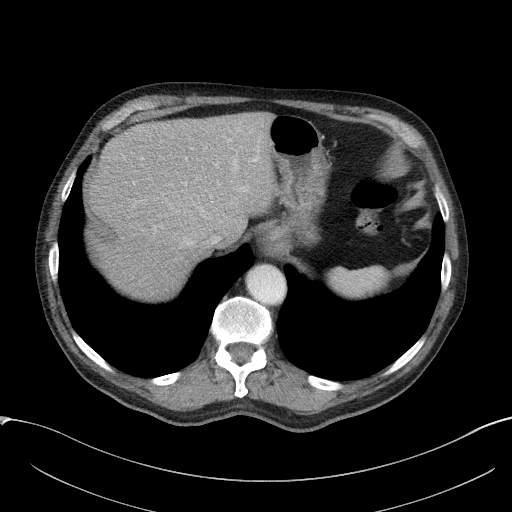
[im 84/90  soft-tissue]
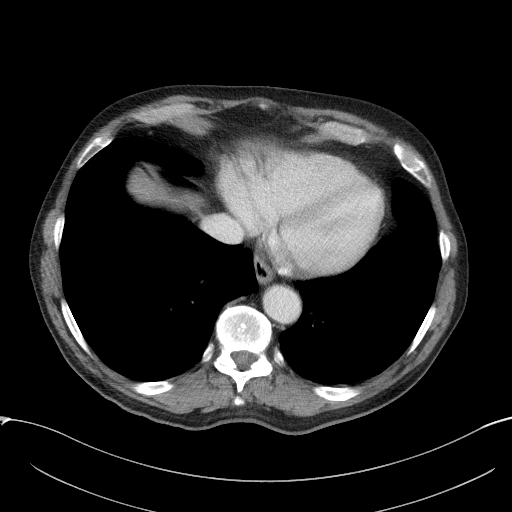

[Series 4: a/p w/ cor · coronal · 0.80mm/px · 3 of 151 slices shown]
[im 51/151  soft-tissue]
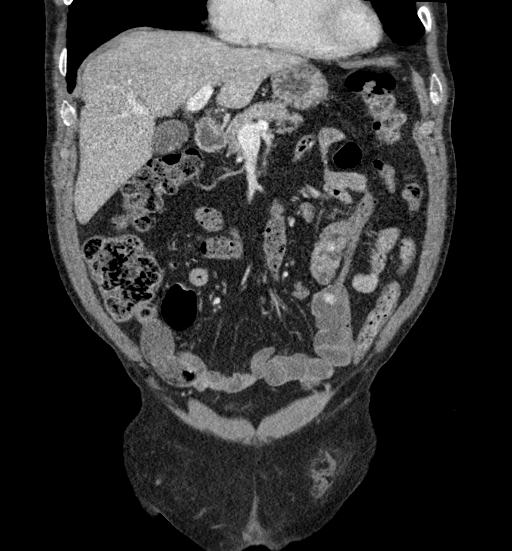
[im 67/151  soft-tissue]
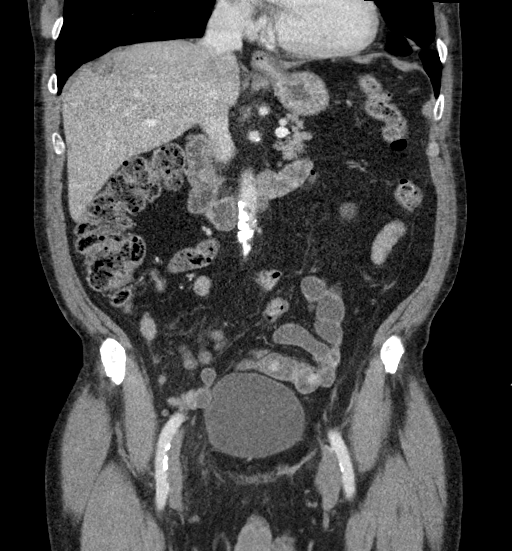
[im 84/151  soft-tissue]
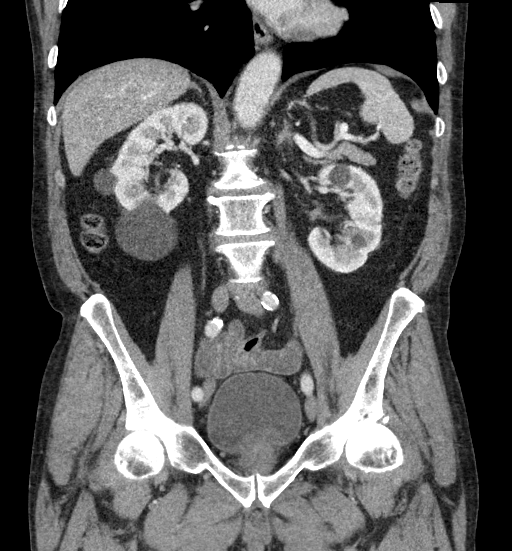

[16 of 46 positions shown; findings below may reference images not displayed]

FINDINGS: Lower chest: No acute abnormality.

Hepatobiliary: Mild fatty infiltration of the liver is noted. The
gallbladder is within normal limits. No biliary ductal dilatation is
seen.

Pancreas: Unremarkable. No pancreatic ductal dilatation or
surrounding inflammatory changes.

Spleen: Normal in size without focal abnormality.

Adrenals/Urinary Tract: Adrenal glands are within normal limits.
Bilateral renal cysts are seen. The largest of these lie within the
lower pole of the right kidney measuring 5.7 cm. No renal calculi or
obstructive changes are noted. Bladder is well distended.

Stomach/Bowel: Stomach is within normal limits. Appendix appears
normal. No evidence of bowel wall thickening, distention, or
inflammatory changes.

Vascular/Lymphatic: Aortic atherosclerosis. No enlarged abdominal or
pelvic lymph nodes.

Reproductive: Prostate is unremarkable.

Other: Fat containing inguinal canal is noted on right. Small direct
inguinal hernia is noted on the right with extension of fat within.
No bowel is noted within the hernia. Prior repair of the inguinal
hernia on the left is noted with some slight laxity of the repair.
Some suggestion of herniated fat is noted beneath the repair which
may represent a recurrent hernia.

Musculoskeletal: Mild degenerative changes of lumbar spine are
noted.
IMPRESSION: Right direct inguinal hernia containing fat. No bowel is noted
within the hernia.

Slight laxity in the repair of the left inguinal hernia. Some fat is
noted extending inferior to the repair consistent with a small
recurrent hernia.

## 2019-05-17 DIAGNOSIS — X32XXXD Exposure to sunlight, subsequent encounter: Secondary | ICD-10-CM | POA: Diagnosis not present

## 2019-05-17 DIAGNOSIS — D225 Melanocytic nevi of trunk: Secondary | ICD-10-CM | POA: Diagnosis not present

## 2019-05-17 DIAGNOSIS — Z1283 Encounter for screening for malignant neoplasm of skin: Secondary | ICD-10-CM | POA: Diagnosis not present

## 2019-05-17 DIAGNOSIS — L570 Actinic keratosis: Secondary | ICD-10-CM | POA: Diagnosis not present

## 2019-06-15 DIAGNOSIS — H43812 Vitreous degeneration, left eye: Secondary | ICD-10-CM | POA: Diagnosis not present

## 2019-06-15 DIAGNOSIS — H04123 Dry eye syndrome of bilateral lacrimal glands: Secondary | ICD-10-CM | POA: Diagnosis not present

## 2019-06-15 DIAGNOSIS — H2513 Age-related nuclear cataract, bilateral: Secondary | ICD-10-CM | POA: Diagnosis not present

## 2019-06-15 DIAGNOSIS — H35373 Puckering of macula, bilateral: Secondary | ICD-10-CM | POA: Diagnosis not present

## 2019-08-10 DIAGNOSIS — E7800 Pure hypercholesterolemia, unspecified: Secondary | ICD-10-CM | POA: Diagnosis not present

## 2019-08-10 DIAGNOSIS — I10 Essential (primary) hypertension: Secondary | ICD-10-CM | POA: Diagnosis not present

## 2019-08-10 DIAGNOSIS — J449 Chronic obstructive pulmonary disease, unspecified: Secondary | ICD-10-CM | POA: Diagnosis not present

## 2019-08-10 DIAGNOSIS — Z0000 Encounter for general adult medical examination without abnormal findings: Secondary | ICD-10-CM | POA: Diagnosis not present

## 2019-08-10 DIAGNOSIS — E039 Hypothyroidism, unspecified: Secondary | ICD-10-CM | POA: Diagnosis not present

## 2019-08-10 DIAGNOSIS — Z23 Encounter for immunization: Secondary | ICD-10-CM | POA: Diagnosis not present

## 2019-08-18 DIAGNOSIS — H6990 Unspecified Eustachian tube disorder, unspecified ear: Secondary | ICD-10-CM | POA: Diagnosis not present

## 2019-11-15 DIAGNOSIS — L821 Other seborrheic keratosis: Secondary | ICD-10-CM | POA: Diagnosis not present

## 2019-11-15 DIAGNOSIS — Z1283 Encounter for screening for malignant neoplasm of skin: Secondary | ICD-10-CM | POA: Diagnosis not present

## 2019-11-15 DIAGNOSIS — L570 Actinic keratosis: Secondary | ICD-10-CM | POA: Diagnosis not present

## 2019-11-15 DIAGNOSIS — X32XXXD Exposure to sunlight, subsequent encounter: Secondary | ICD-10-CM | POA: Diagnosis not present

## 2020-01-26 DIAGNOSIS — E039 Hypothyroidism, unspecified: Secondary | ICD-10-CM | POA: Diagnosis not present

## 2020-01-26 DIAGNOSIS — E7800 Pure hypercholesterolemia, unspecified: Secondary | ICD-10-CM | POA: Diagnosis not present

## 2020-01-26 DIAGNOSIS — I10 Essential (primary) hypertension: Secondary | ICD-10-CM | POA: Diagnosis not present

## 2020-01-26 DIAGNOSIS — J449 Chronic obstructive pulmonary disease, unspecified: Secondary | ICD-10-CM | POA: Diagnosis not present

## 2020-01-26 DIAGNOSIS — Z23 Encounter for immunization: Secondary | ICD-10-CM | POA: Diagnosis not present

## 2020-02-08 ENCOUNTER — Encounter: Payer: Self-pay | Admitting: Gastroenterology

## 2020-03-09 ENCOUNTER — Ambulatory Visit (AMBULATORY_SURGERY_CENTER): Payer: Medicare HMO | Admitting: *Deleted

## 2020-03-09 ENCOUNTER — Other Ambulatory Visit: Payer: Self-pay

## 2020-03-09 ENCOUNTER — Telehealth: Payer: Self-pay | Admitting: *Deleted

## 2020-03-09 VITALS — Ht 69.0 in | Wt 178.0 lb

## 2020-03-09 DIAGNOSIS — Z86010 Personal history of colonic polyps: Secondary | ICD-10-CM

## 2020-03-09 MED ORDER — NA SULFATE-K SULFATE-MG SULF 17.5-3.13-1.6 GM/177ML PO SOLN: 1.0000 | Freq: Once | ORAL | 0 refills | Status: AC

## 2020-03-09 NOTE — Progress Notes (Signed)
No egg or soy allergy known to patient  No issues with past sedation with any surgeries or procedures No intubation problems in the past  No FH of Malignant Hyperthermia No diet pills per patient No home 02 use per patient  No blood thinners per patient  Pt denies issues with constipation  No A fib or A flutter  EMMI video to pt or via [place] 19 guidelines implemented in PV today with Pt and RN  Pt is fully vaccinated  for Covid  Pt denies loose or missing teeth, denies dentures, partials, dental implants, capped or bonded teeth   Discussed with pt there will be an out-of-pocket cost for prep and that varies from $0 to 70 dollars   Due to the COVID-19 pandemic we are asking patients to follow certain guidelines.  Pt aware of COVID protocols and [facility] guidelines   Virtual pre-visit completed, instructions mailed .

## 2020-03-09 NOTE — Telephone Encounter (Signed)
Virtual pre-visit completed.  Instructions mailed.

## 2020-03-15 ENCOUNTER — Telehealth: Payer: Self-pay | Admitting: Gastroenterology

## 2020-03-22 ENCOUNTER — Encounter: Payer: Self-pay | Admitting: Certified Registered Nurse Anesthetist

## 2020-03-23 ENCOUNTER — Encounter: Payer: Self-pay | Admitting: Gastroenterology

## 2020-03-23 ENCOUNTER — Other Ambulatory Visit: Payer: Self-pay

## 2020-03-23 ENCOUNTER — Ambulatory Visit (AMBULATORY_SURGERY_CENTER): Payer: Medicare HMO | Admitting: Gastroenterology

## 2020-03-23 VITALS — BP 110/68 | HR 65 | Temp 97.8°F | Resp 10 | Ht 69.0 in | Wt 178.0 lb

## 2020-03-23 DIAGNOSIS — Z86010 Personal history of colonic polyps: Secondary | ICD-10-CM | POA: Diagnosis not present

## 2020-03-23 DIAGNOSIS — J449 Chronic obstructive pulmonary disease, unspecified: Secondary | ICD-10-CM | POA: Diagnosis not present

## 2020-03-23 DIAGNOSIS — J45909 Unspecified asthma, uncomplicated: Secondary | ICD-10-CM | POA: Diagnosis not present

## 2020-03-23 DIAGNOSIS — D120 Benign neoplasm of cecum: Secondary | ICD-10-CM | POA: Diagnosis not present

## 2020-03-23 DIAGNOSIS — I10 Essential (primary) hypertension: Secondary | ICD-10-CM | POA: Diagnosis not present

## 2020-03-23 DIAGNOSIS — E039 Hypothyroidism, unspecified: Secondary | ICD-10-CM | POA: Diagnosis not present

## 2020-03-23 DIAGNOSIS — Z1211 Encounter for screening for malignant neoplasm of colon: Secondary | ICD-10-CM | POA: Diagnosis not present

## 2020-03-23 MED ORDER — SODIUM CHLORIDE 0.9 % IV SOLN: 500.0000 mL | Freq: Once | INTRAVENOUS | Status: DC

## 2020-03-23 NOTE — Progress Notes (Signed)
Report given to PACU, vss 

## 2020-03-23 NOTE — Op Note (Signed)
[facility] Patient Name: Brandon Sanford Procedure Date: 03/23/2020 8:40 AM MRN: [id number] Endoscopist: Mallie Mussel L. Loletha Carrow , MD Age: 80 Referring MD:  Date of Birth: 1940/04/27 Gender: Male Account #: [id number] Procedure:                Colonoscopy Indications:              Surveillance: Personal history of adenomatous                            polyps on last colonoscopy > 3 years ago (< 65mm TA                            x 2 in Feb 2018, fair right colon prep on that exam) Medicines:                Monitored Anesthesia Care Procedure:                Pre-Anesthesia Assessment:                           - Prior to the procedure, a History and Physical                            was performed, and patient medications and                            allergies were reviewed. The patient's tolerance of                            previous anesthesia was also reviewed. The risks                            and benefits of the procedure and the sedation                            options and risks were discussed with the patient.                            All questions were answered, and informed consent                            was obtained. Prior Anticoagulants: The patient has                            taken no previous anticoagulant or antiplatelet                            agents. ASA Grade Assessment: III - A patient with                            severe systemic disease. After reviewing the risks                            and benefits, the patient was deemed in  satisfactory condition to undergo the procedure.                           After obtaining informed consent, the colonoscope                            was passed under direct vision. Throughout the                            procedure, the patient's blood pressure, pulse, and                            oxygen saturations were monitored continuously. The                            Olympus  [id number]) Colonoscope was                            introduced through the anus and advanced to the the                            cecum, identified by appendiceal orifice and                            ileocecal valve. The colonoscopy was performed                            without difficulty. The patient tolerated the                            procedure well. The quality of the bowel                            preparation was good. The ileocecal valve,                            appendiceal orifice, and rectum were photographed.                            The bowel preparation used was 2 day Suprep/Miralax. Scope In: 8:51:21 AM Scope Out: 9:09:09 AM Scope Withdrawal Time: 0 hours 11 minutes 54 seconds  Total Procedure Duration: 0 hours 17 minutes 48 seconds  Findings:                 The perianal and digital rectal examinations were                            normal.                           A 6 mm polyp was found in the cecum. The polyp was                            sessile. The polyp was removed with a cold snare.  Resection and retrieval were complete.                           Multiple diverticula were found in the left colon.                           The exam was otherwise without abnormality on                            direct and retroflexion views. Complications:            No immediate complications. Estimated Blood Loss:     Estimated blood loss was minimal. Impression:               - One 6 mm polyp in the cecum, removed with a cold                            snare. Resected and retrieved.                           - Diverticulosis in the left colon.                           - The examination was otherwise normal on direct                            and retroflexion views. Recommendation:           - Patient has a contact number available for                            emergencies. The signs and symptoms of potential                             delayed complications were discussed with the                            patient. Return to normal activities tomorrow.                            Written discharge instructions were provided to the                            patient.                           - Resume previous diet.                           - Continue present medications.                           - Await pathology results.                           - No repeat surveillance colonoscopy due to age and  current guidelines. Waldon Sheerin L. Loletha Carrow, MD 03/23/2020 9:13:56 AM This report has been signed electronically.

## 2020-03-23 NOTE — Patient Instructions (Signed)
Resume previous diet and medications. Await path results. No repeat colonoscopy due to age.  YOU HAD AN ENDOSCOPIC PROCEDURE TODAY AT [facility]:   Refer to the procedure report that was given to you for any specific questions about what was found during the examination.  If the procedure report does not answer your questions, please call your gastroenterologist to clarify.  If you requested that your care partner not be given the details of your procedure findings, then the procedure report has been included in a sealed envelope for you to review at your convenience later.  YOU SHOULD EXPECT: Some feelings of bloating in the abdomen. Passage of more gas than usual.  Walking can help get rid of the air that was put into your GI tract during the procedure and reduce the bloating. If you had a lower endoscopy (such as a colonoscopy or flexible sigmoidoscopy) you may notice spotting of blood in your stool or on the toilet paper. If you underwent a bowel prep for your procedure, you may not have a normal bowel movement for a few days.  Please Note:  You might notice some irritation and congestion in your nose or some drainage.  This is from the oxygen used during your procedure.  There is no need for concern and it should clear up in a day or so.  SYMPTOMS TO REPORT IMMEDIATELY:   Following lower endoscopy (colonoscopy or flexible sigmoidoscopy):  Excessive amounts of blood in the stool  Significant tenderness or worsening of abdominal pains  Swelling of the abdomen that is new, acute  Fever of 100F or higher   For urgent or emergent issues, a gastroenterologist can be reached at any hour by calling [phone]. Do not use MyChart messaging for urgent concerns.    DIET:  We do recommend a small meal at first, but then you may proceed to your regular diet.  Drink plenty of fluids but you should avoid alcoholic beverages for 24 hours.  ACTIVITY:  You should plan to take it  easy for the rest of today and you should NOT DRIVE or use heavy machinery until tomorrow (because of the sedation medicines used during the test).    FOLLOW UP: Our staff will call the number listed on your records 48-72 hours following your procedure to check on you and address any questions or concerns that you may have regarding the information given to you following your procedure. If we do not reach you, we will leave a message.  We will attempt to reach you two times.  During this call, we will ask if you have developed any symptoms of COVID 19. If you develop any symptoms (ie: fever, flu-like symptoms, shortness of breath, cough etc.) before then, please call [phone].  If you test positive for Covid 19 in the 2 weeks post procedure, please call and report this information to [place].    If any biopsies were taken you will be contacted by phone or by letter within the next 1-3 weeks.  Please call us at [phone] if you have not heard about the biopsies in 3 weeks.    SIGNATURES/CONFIDENTIALITY: You and/or your care partner have signed paperwork which will be entered into your electronic medical record.  These signatures attest to the fact that that the information above on your After Visit Summary has been reviewed and is understood.  Full responsibility of the confidentiality of this discharge information lies with you and/or your care-partner.

## 2020-03-23 NOTE — Progress Notes (Signed)
Called to room to assist during endoscopic procedure.  Patient ID and intended procedure confirmed with present staff. Received instructions for my participation in the procedure from the performing physician.  

## 2020-03-23 NOTE — Progress Notes (Signed)
Pt's states no medical or surgical changes since previsit or office visit. 

## 2020-03-27 ENCOUNTER — Telehealth: Payer: Self-pay | Admitting: *Deleted

## 2020-03-27 NOTE — Telephone Encounter (Signed)
  Follow up Call-  Call back number 03/23/2020  Post procedure Call Back phone  # [phone]  Permission to leave phone message Yes  Some recent data might be hidden     Patient questions:  Do you have a fever, pain , or abdominal swelling? No. Pain Score  0 *  Have you tolerated food without any problems? Yes.    Have you been able to return to your normal activities? Yes.    Do you have any questions about your discharge instructions: Diet   No. Medications  No. Follow up visit  No.  Do you have questions or concerns about your Care? No.  Actions: * If pain score is 4 or above: No action needed, pain <4.

## 2020-03-31 ENCOUNTER — Encounter: Payer: Self-pay | Admitting: Gastroenterology

## 2020-06-17 ENCOUNTER — Encounter: Payer: Self-pay | Admitting: Emergency Medicine

## 2020-06-17 ENCOUNTER — Ambulatory Visit
Admission: EM | Admit: 2020-06-17 | Discharge: 2020-06-17 | Disposition: A | Discharge status: Home / Self Care | Payer: Medicare HMO | Attending: Emergency Medicine | Admitting: Emergency Medicine

## 2020-06-17 ENCOUNTER — Other Ambulatory Visit: Payer: Self-pay

## 2020-06-17 ENCOUNTER — Ambulatory Visit (INDEPENDENT_AMBULATORY_CARE_PROVIDER_SITE_OTHER): Payer: Medicare HMO

## 2020-06-17 DIAGNOSIS — Z20822 Contact with and (suspected) exposure to covid-19: Secondary | ICD-10-CM | POA: Diagnosis not present

## 2020-06-17 DIAGNOSIS — J441 Chronic obstructive pulmonary disease with (acute) exacerbation: Secondary | ICD-10-CM | POA: Diagnosis not present

## 2020-06-17 DIAGNOSIS — R051 Acute cough: Secondary | ICD-10-CM | POA: Diagnosis not present

## 2020-06-17 DIAGNOSIS — R059 Cough, unspecified: Secondary | ICD-10-CM | POA: Diagnosis not present

## 2020-06-17 DIAGNOSIS — J449 Chronic obstructive pulmonary disease, unspecified: Secondary | ICD-10-CM | POA: Diagnosis not present

## 2020-06-17 DIAGNOSIS — R509 Fever, unspecified: Secondary | ICD-10-CM | POA: Diagnosis not present

## 2020-06-17 MED ORDER — IPRATROPIUM-ALBUTEROL 0.5-2.5 (3) MG/3ML IN SOLN: 3.0000 mL | RESPIRATORY_TRACT | 0 refills | Status: DC | PRN

## 2020-06-17 MED ORDER — ACETAMINOPHEN 325 MG PO TABS
650.0000 mg | ORAL_TABLET | Freq: Once | ORAL | Status: AC
  Administered 2020-06-17: 650 mg via ORAL

## 2020-06-17 MED ORDER — DM-GUAIFENESIN ER 30-600 MG PO TB12: 1.0000 | ORAL_TABLET | Freq: Two times a day (BID) | ORAL | 0 refills | Status: DC

## 2020-06-17 MED ORDER — PREDNISONE 50 MG PO TABS: 50.0000 mg | ORAL_TABLET | Freq: Every day | ORAL | 0 refills | Status: AC

## 2020-06-17 MED ORDER — DOXYCYCLINE HYCLATE 100 MG PO CAPS: 100.0000 mg | ORAL_CAPSULE | Freq: Two times a day (BID) | ORAL | 0 refills | Status: AC

## 2020-06-17 MED ORDER — BENZONATATE 200 MG PO CAPS: 200.0000 mg | ORAL_CAPSULE | Freq: Three times a day (TID) | ORAL | 0 refills | Status: AC | PRN

## 2020-06-17 MED ORDER — METHYLPREDNISOLONE SODIUM SUCC 125 MG IJ SOLR
125.0000 mg | Freq: Once | INTRAMUSCULAR | Status: AC
  Administered 2020-06-17: 125 mg via INTRAMUSCULAR

## 2020-06-17 NOTE — Discharge Instructions (Addendum)
Please use DuoNeb nebulizers every 4-6 hours for shortness of breath chest tightness and wheezing Continue normal inhalers as prescribed Prednisone daily x5 days Doxycycline twice daily for 1 week Tessalon every 8 hours for cough Use with Mucinex DM twice daily  If you do not see any improvement in breathing, shortness of breath over the next 24 to 48 hours please go to the emergency room, otherwise follow-up in 2 to 3 days for recheck of oxygen

## 2020-06-17 NOTE — ED Triage Notes (Signed)
Pt sts cough x 2 days with congestion; pt unsure of fever; pt noted 89-90% O2 sats; pt unsure of norm given COPD; pt sts some SOB

## 2020-06-17 NOTE — ED Provider Notes (Signed)
[facility]    CSN: [id number] Arrival date & time: 06/17/20  0810      History   Chief Complaint Chief Complaint  Patient presents with  . Cough    HPI Brandon Sanford is a 80 y.o. male history of COPD, GERD, hypertension, presenting today for evaluation of cough.  Reports 2 days of cough and congestion, increased shortness of breath from baseline.  Unsure of normal oxygen level, on chart review 2 months ago at visit O2 was 96%.  He has felt chilled and noted fever today.  HPI  Past Medical History:  Diagnosis Date  . Allergy   . Arthritis   . Asthma    as a child  . Cancer ([place])    skin - neck  "pre- cancer"  . COPD (chronic obstructive pulmonary disease) ([place])   . Depression    pt denies  . Dyspnea    With exertion  . GERD (gastroesophageal reflux disease)    tums if needed  . Hyperlipidemia   . Hypertension   . Hypothyroidism   . Inguinal hernia   . Plantar fasciitis   . Skin moles   . Thyroid disease   . Umbilical hernia    repaired    Patient Active Problem List   Diagnosis Date Noted  . 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 Pain in 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  . 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 Umbilical pain 34/74/2595  . ASTHMA W/ COPD 06/03/2007  . PULMONARY NODULE 04/27/2007  . DYSPNEA 04/27/2007  . HYPOTHYROIDISM 04/24/2007  . DEPRESSION 04/24/2007  . HYPERTENSION 04/24/2007    Past Surgical History:  Procedure Laterality Date  . COLONOSCOPY    . COLONOSCOPY W/ POLYPECTOMY    . HERNIA REPAIR  63/8/75   umbilical hernia and LIH repair   . INGUINAL HERNIA REPAIR  11/26/2010   Procedure: HERNIA REPAIR INGUINAL ADULT;  Surgeon: Zenovia Jarred, MD;  Location: [place];  Service: General;  Laterality: Left;  left inguinal hernia repair with mesh  . INGUINAL HERNIA REPAIR Right 02/04/2018   Procedure: REPAIR OF RIGHT INGUINAL HERNIA WITH MESH;  Surgeon: Georganna Skeans, MD;   Location: [place];  Service: General;  Laterality: Right;  . INGUINAL HERNIA REPAIR Right 10/16/2018   Procedure: LAPAROSCOPIC RIGHT INGUINAL HERNIA;  Surgeon: Ralene Ok, MD;  Location: [place];  Service: General;  Laterality: Right;  . INSERTION OF MESH Right 02/04/2018   Procedure: INSERTION OF MESH;  Surgeon: Georganna Skeans, MD;  Location: [place];  Service: General;  Laterality: Right;  . INSERTION OF MESH N/A 10/16/2018   Procedure: Insertion Of Mesh;  Surgeon: Ralene Ok, MD;  Location: [place];  Service: General;  Laterality: N/A;  . TONSILLECTOMY    . 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   Procedure: LEFT TOTAL HIP ARTHROPLASTY ANTERIOR APPROACH;  Surgeon: Mcarthur Rossetti, MD;  Location: [place];  Service: Orthopedics;  Laterality: Left;  . UMBILICAL HERNIA REPAIR  11/26/2010   Procedure: HERNIA REPAIR UMBILICAL ADULT;  Surgeon: Zenovia Jarred, MD;  Location: [place];  Service: General;  Laterality: N/A;  umbilical hernia repair with mesh       Home Medications    Prior to Admission medications   Medication Sig Start Date End Date Taking? Authorizing Provider  benzonatate (TESSALON) 200 MG capsule Take 1 capsule (200 mg total) by mouth 3 (three) times daily as needed for up to 7  days for cough. 06/17/20 06/24/20 Yes Abisola Carrero C, PA-C  dextromethorphan-guaiFENesin (MUCINEX DM) 30-600 MG 12hr tablet Take 1 tablet by mouth 2 (two) times daily. 06/17/20  Yes Onofrio Klemp C, PA-C  doxycycline (VIBRAMYCIN) 100 MG capsule Take 1 capsule (100 mg total) by mouth 2 (two) times daily for 7 days. 06/17/20 06/24/20 Yes Melessia Kaus C, PA-C  ipratropium-albuterol (DUONEB) 0.5-2.5 (3) MG/3ML SOLN Take 3 mLs by nebulization every 4 (four) hours as needed. 06/17/20  Yes Tnia Anglada C, PA-C  predniSONE (DELTASONE) 50 MG tablet Take 1 tablet (50 mg total) by mouth daily with breakfast for 5 days. 06/17/20 06/22/20 Yes Zhi Geier C, PA-C  albuterol (VENTOLIN HFA)  108 (90 Base) MCG/ACT inhaler Inhale 1 puff into the lungs as needed.    [provider]  amLODipine (NORVASC) 5 MG tablet Take 5 mg by mouth daily.    [provider]  budesonide-formoterol (SYMBICORT) 160-4.5 MCG/ACT inhaler Inhale 2 puffs into the lungs 2 (two) times daily.    [provider]  diphenhydrAMINE (SOMINEX) 25 MG tablet Take 50 mg by mouth at bedtime.    [provider]  Fluticasone-Salmeterol (ADVAIR) 500-50 MCG/DOSE AEPB Inhale 500 puffs into the lungs in the morning and at bedtime. 12/28/19   [provider]  hydrochlorothiazide (MICROZIDE) 12.5 MG capsule Take 12.5 mg by mouth daily.    [provider]  levothyroxine (SYNTHROID, LEVOTHROID) 100 MCG tablet Take 100 mcg by mouth daily before breakfast.    [provider]  Multiple Vitamin (MULTIVITAMIN WITH MINERALS) TABS tablet Take 1 tablet by mouth daily.    [provider]  Multiple Vitamin (MULTIVITAMIN) tablet Take 1 tablet by mouth daily.    [provider]  Polyethyl Glycol-Propyl Glycol 0.4-0.3 % SOLN Place 1-2 drops into both eyes 3 (three) times daily as needed (dry eyes/irritated eyes).    [provider]  Polyethylene Glycol 3350 (MIRALAX PO) Take 119 g by mouth once. colonoscopy    [provider]  simvastatin (ZOCOR) 40 MG tablet Take 40 mg by mouth every evening.     [provider]  TUMS 500 MG chewable tablet Chew 500 mg by mouth 2 (two) times daily as needed for indigestion or heartburn. 04/19/17   [provider]    Family History Family History  Problem Relation Age of Onset  . Colon cancer Neg Hx   . Colon polyps Neg Hx   . Esophageal cancer Neg Hx   . Stomach cancer Neg Hx   . Rectal cancer Neg Hx     Social History Social History   Tobacco Use  . Smoking status: Former Smoker    Years: 40.00    Quit date: 11/12/1995    Years since quitting: 24.6  . Smokeless tobacco: Never Used   Vaping Use  . Vaping Use: Never used  Substance Use Topics  . Alcohol use: Yes    Alcohol/week: 2.0 standard drinks    Types: 2 Cans of beer per week  . Drug use: No     Allergies   Chlorhexidine and Pneumococcal vaccines   Review of Systems Review of Systems  Constitutional: Positive for chills and fatigue. Negative for activity change, appetite change and fever.  HENT: Positive for congestion. Negative for ear pain, rhinorrhea, sinus pressure, sore throat and trouble swallowing.   Eyes: Negative for discharge and redness.  Respiratory: Positive for cough, shortness of breath and wheezing. Negative for chest tightness.   Cardiovascular: Negative for chest  pain.  Gastrointestinal: Negative for abdominal pain, diarrhea, nausea and vomiting.  Musculoskeletal: Negative for myalgias.  Skin: Negative for rash.  Neurological: Negative for dizziness, light-headedness and headaches.     Physical Exam Triage Vital Signs ED Triage Vitals  Enc Vitals Group     BP      Pulse      Resp      Temp      Temp src      SpO2      Weight      Height      Head Circumference      Peak Flow      Pain Score      Pain Loc      Pain Edu?      Excl. in [place]?    No data found.  Updated Vital Signs BP 132/82 (BP Location: Left Arm)   Pulse (!) 110   Temp 100.2 F (37.9 C) (Oral)   Resp 18   SpO2 91%   Visual Acuity Right Eye Distance:   Left Eye Distance:   Bilateral Distance:    Right Eye Near:   Left Eye Near:    Bilateral Near:     Physical Exam Vitals and nursing note reviewed.  Constitutional:      Appearance: He is well-developed.     Comments: No acute distress  HENT:     Head: Normocephalic and atraumatic.     Ears:     Comments: Bilateral ears without tenderness to palpation of external auricle, tragus and mastoid, EAC's without erythema or swelling, TM's with good bony landmarks and cone of light. Non erythematous.     Nose: Nose normal.     Mouth/Throat:      Comments: Oral mucosa pink and moist, no tonsillar enlargement or exudate. Posterior pharynx patent and nonerythematous, no uvula deviation or swelling. Normal phonation. Eyes:     Conjunctiva/sclera: Conjunctivae normal.  Cardiovascular:     Rate and Rhythm: Normal rate.  Pulmonary:     Effort: Pulmonary effort is normal. No respiratory distress.     Comments: Breath sounds slightly diminished throughout, slightly coarse and faint wheezing noted bilaterally Abdominal:     General: There is no distension.  Musculoskeletal:        General: Normal range of motion.     Cervical back: Neck supple.  Skin:    General: Skin is warm and dry.  Neurological:     Mental Status: He is alert and oriented to person, place, and time.      UC Treatments / Results  Labs (all labs ordered are listed, but only abnormal results are displayed) Labs Reviewed  NOVEL CORONAVIRUS, NAA    EKG   Radiology DG Chest 2 View  Result Date: 06/17/2020 CLINICAL DATA:  COPD.  Two days of cough and fever with congestion EXAM: CHEST - 2 VIEW COMPARISON:  11/29/2013 FINDINGS: Large lung volumes. Fissural fat at the left base. Symmetric nipple shadows. There is no edema, consolidation, effusion, or pneumothorax. Normal heart size and stable mediastinal contours. IMPRESSION: Stable exam.  No evidence of acute disease. Electronically Signed   By: Monte Fantasia M.D.   On: 06/17/2020 09:29    Procedures Procedures (including critical care time)  Medications Ordered in UC Medications  acetaminophen (TYLENOL) tablet 650 mg (650 mg Oral Given 06/17/20 0843)  methylPREDNISolone sodium succinate (SOLU-MEDROL) 125 mg/2 mL injection 125 mg (125 mg Intramuscular Given 06/17/20 0902)    Initial Impression / Assessment  and Plan / UC Course  I have reviewed the triage vital signs and the nursing notes.  Pertinent labs & imaging results that were available during my care of the patient were reviewed by me and considered  in my medical decision making (see chart for details).     COPD exacerbation- chest x-ray negative for signs of pneumonia, COVID test pending, low-grade fever in clinic today, treating for exacerbation of COPD, prescribed Doxy, Solu-Medrol provided prior to discharge, continue on prednisone, discussed relative hypoxia around 90%, discussed concern about this being lower from his baseline.  Patient reluctant to go to emergency room, will send home with nebulizer machine and have performed duo nebs at home with close monitoring of symptoms in the next 24 to 48 hours, if not improving patient to go to the emergency room, patient to follow-up here otherwise in 2 to 3 days for recheck of oxygen.  Symptomatic and supportive care of cough/congestion.   Discussed strict return precautions. Patient verbalized understanding and is agreeable with plan.  Final Clinical Impressions(s) / UC Diagnoses   Final diagnoses:  COPD exacerbation ([place])     Discharge Instructions     Please use DuoNeb nebulizers every 4-6 hours for shortness of breath chest tightness and wheezing Continue normal inhalers as prescribed Prednisone daily x5 days Doxycycline twice daily for 1 week Tessalon every 8 hours for cough Use with Mucinex DM twice daily  If you do not see any improvement in breathing, shortness of breath over the next 24 to 48 hours please go to the emergency room, otherwise follow-up in 2 to 3 days for recheck of oxygen     ED Prescriptions    Medication Sig Dispense Auth. Provider   doxycycline (VIBRAMYCIN) 100 MG capsule Take 1 capsule (100 mg total) by mouth 2 (two) times daily for 7 days. 14 capsule Cathy Ropp C, PA-C   ipratropium-albuterol (DUONEB) 0.5-2.5 (3) MG/3ML SOLN Take 3 mLs by nebulization every 4 (four) hours as needed. 360 mL Taela Charbonneau C, PA-C   benzonatate (TESSALON) 200 MG capsule Take 1 capsule (200 mg total) by mouth 3 (three) times daily as needed for up to 7 days for  cough. 28 capsule Nehal Shives C, PA-C   dextromethorphan-guaiFENesin (MUCINEX DM) 30-600 MG 12hr tablet Take 1 tablet by mouth 2 (two) times daily. 20 tablet Elois Averitt C, PA-C   predniSONE (DELTASONE) 50 MG tablet Take 1 tablet (50 mg total) by mouth daily with breakfast for 5 days. 5 tablet Marlon Suleiman, [place] C, PA-C     PDMP not reviewed this encounter.   Makyah Lavigne, [place] C, PA-C 06/17/20 1004

## 2020-06-19 LAB — SARS-COV-2, NAA 2 DAY TAT

## 2020-06-19 LAB — NOVEL CORONAVIRUS, NAA: SARS-CoV-2, NAA: NOT DETECTED

## 2020-06-20 DIAGNOSIS — H35373 Puckering of macula, bilateral: Secondary | ICD-10-CM | POA: Diagnosis not present

## 2020-06-20 DIAGNOSIS — H43812 Vitreous degeneration, left eye: Secondary | ICD-10-CM | POA: Diagnosis not present

## 2020-06-20 DIAGNOSIS — H2513 Age-related nuclear cataract, bilateral: Secondary | ICD-10-CM | POA: Diagnosis not present

## 2020-06-20 DIAGNOSIS — H04123 Dry eye syndrome of bilateral lacrimal glands: Secondary | ICD-10-CM | POA: Diagnosis not present

## 2020-06-21 DIAGNOSIS — J449 Chronic obstructive pulmonary disease, unspecified: Secondary | ICD-10-CM | POA: Diagnosis not present

## 2020-07-05 DIAGNOSIS — S90219A Contusion of unspecified great toe with damage to nail, initial encounter: Secondary | ICD-10-CM | POA: Diagnosis not present

## 2020-07-22 ENCOUNTER — Encounter: Payer: Self-pay | Admitting: Emergency Medicine

## 2020-07-22 ENCOUNTER — Ambulatory Visit (INDEPENDENT_AMBULATORY_CARE_PROVIDER_SITE_OTHER): Payer: Medicare HMO

## 2020-07-22 ENCOUNTER — Ambulatory Visit
Admission: EM | Admit: 2020-07-22 | Discharge: 2020-07-22 | Disposition: A | Discharge status: Home / Self Care | Payer: Medicare HMO | Attending: Family Medicine | Admitting: Family Medicine

## 2020-07-22 ENCOUNTER — Other Ambulatory Visit: Payer: Self-pay

## 2020-07-22 DIAGNOSIS — R000 Tachycardia, unspecified: Secondary | ICD-10-CM

## 2020-07-22 DIAGNOSIS — R911 Solitary pulmonary nodule: Secondary | ICD-10-CM

## 2020-07-22 DIAGNOSIS — R059 Cough, unspecified: Secondary | ICD-10-CM | POA: Diagnosis not present

## 2020-07-22 DIAGNOSIS — R062 Wheezing: Secondary | ICD-10-CM

## 2020-07-22 DIAGNOSIS — J189 Pneumonia, unspecified organism: Secondary | ICD-10-CM | POA: Diagnosis not present

## 2020-07-22 DIAGNOSIS — R6883 Chills (without fever): Secondary | ICD-10-CM | POA: Diagnosis not present

## 2020-07-22 MED ORDER — LEVOFLOXACIN 750 MG PO TABS: 750.0000 mg | ORAL_TABLET | Freq: Every day | ORAL | 0 refills | Status: DC

## 2020-07-22 MED ORDER — PREDNISONE 20 MG PO TABS: 40.0000 mg | ORAL_TABLET | Freq: Every day | ORAL | 0 refills | Status: DC

## 2020-07-22 NOTE — ED Triage Notes (Signed)
Patient c/o dizziness and fatigue x 3 days.   Patient denies fever at home.   Patient endorses chills and productive cough w/ " yellowish" sputum.   Patient endorses increased SOB.   Patient has history of COPD.   Patient hasn't taken any medications for symptoms.

## 2020-07-22 NOTE — ED Provider Notes (Signed)
[place]   [id number] 07/22/20 Arrival Time: 0802  ASSESSMENT & PLAN:  1. Community acquired pneumonia of right lower lobe of lung   2. Incidental lung nodule   3. Wheezing    No indication for hospital admission at this time.  ECG: Performed today and interpreted by me: slight sinus tachycardia; no STEMI.  I have personally viewed the imaging studies ordered this visit. CXR: R lower PNA.  Begin: Meds ordered this encounter  Medications   levofloxacin (LEVAQUIN) 750 MG tablet    Sig: Take 1 tablet (750 mg total) by mouth daily.    Dispense:  7 tablet    Refill:  0   predniSONE (DELTASONE) 20 MG tablet    Sig: Take 2 tablets (40 mg total) by mouth daily.    Dispense:  10 tablet    Refill:  0   Discussed incidental nodule on CXR. Copy of radiologist's report given.  Recommend:  Follow-up Information     Schedule an appointment as soon as possible for a visit  with Alroy Dust, L.Marlou Sa, MD.   Specialty: Family Medicine Why: To discuss the nodule seen on your chext x-ray today. Contact information: [address] [place] [address] [phone]                 Reviewed expectations re: course of current medical issues. Questions answered. Outlined signs and symptoms indicating need for more acute intervention. Patient verbalized understanding. After Visit Summary given.   SUBJECTIVE:  History from: patient. BRIDGER PIZZI is a 80 y.o. male with h/o COPD who presents with complaint of not feeling well this week. Fatigued and lightheaded at times. Coughing. SOB yest and today. Chills today. Denies fever. Ambulatory without difficulty. Without associated CP. Normal PO intake without n/v/d. No tx PTA.   Social History   Tobacco Use  Smoking Status Former   Years: 40.00   Pack years: 0.00   Types: Cigarettes   Quit date: 11/12/1995   Years since quitting: 24.7  Smokeless Tobacco Never   Social History   Substance and  Sexual Activity  Alcohol Use Yes   Alcohol/week: 2.0 standard drinks   Types: 2 Cans of beer per week     OBJECTIVE:  Vitals:   07/22/20 0815  BP: 116/78  Pulse: (!) 111  Resp: 16  Temp: 98.5 F (36.9 C)  TempSrc: Oral  SpO2: 90%    Recheck P: 104 General appearance: alert, oriented, no acute distress but appears fatigued3 Eyes: PERRLA; EOMI; conjunctivae normal HENT: normocephalic; atraumatic Neck: supple with FROM Lungs: without labored respirations; speaks full sentences without difficulty; moderated exp wheezes throughout Abdomen: soft, non-tender; no guarding or rebound tenderness Extremities: without edema; without calf swelling or tenderness; symmetrical without gross deformities Skin: warm and dry; without rash or lesions Neuro: normal gait Psychological: alert and cooperative; normal mood and affect  Imaging: DG Chest 2 View  Result Date: 07/22/2020 CLINICAL DATA:  Productive cough and chills. EXAM: CHEST - 2 VIEW COMPARISON:  06/17/2020 FINDINGS: New airspace opacity identified right lung base compatible with pneumonia. Stable scarring left lung base. A new nodular density projects just above the posterior left ninth rib. The cardiopericardial silhouette is within normal limits for size. The visualized bony structures of the thorax show no acute abnormality. IMPRESSION: 1. New right base airspace opacity compatible with pneumonia. 2. New nodular density left lung base. CT chest without contrast recommended to further evaluate. Electronically Signed   By: Misty Stanley  M.D.   On: 07/22/2020 08:45     Allergies  Allergen Reactions   Chlorhexidine Rash   Pneumococcal Vaccines Rash    Past Medical History:  Diagnosis Date   Allergy    Arthritis    Asthma    as a child   Cancer ([place])    skin - neck  "pre- cancer"   COPD (chronic obstructive pulmonary disease) ([place])    Depression    pt denies   Dyspnea    With exertion   GERD (gastroesophageal reflux disease)     tums if needed   Hyperlipidemia    Hypertension    Hypothyroidism    Inguinal hernia    Plantar fasciitis    Skin moles    Thyroid disease    Umbilical hernia    repaired   Social History   Socioeconomic History   Marital status: Married    Spouse name: Not on file   Number of children: Not on file   Years of education: Not on file   Highest education level: Not on file  Occupational History   Not on file  Tobacco Use   Smoking status: Former    Years: 40.00    Pack years: 0.00    Types: Cigarettes    Quit date: 11/12/1995    Years since quitting: 24.7   Smokeless tobacco: Never  Vaping Use   Vaping Use: Never used  Substance and Sexual Activity   Alcohol use: Yes    Alcohol/week: 2.0 standard drinks    Types: 2 Cans of beer per week   Drug use: No   Sexual activity: Not on file  Other Topics Concern   Not on file  Social History Narrative   Not on file   Social Determinants of Health   Financial Resource Strain: Not on file  Food Insecurity: Not on file  Transportation Needs: Not on file  Physical Activity: Not on file  Stress: Not on file  Social Connections: Not on file  Intimate Partner Violence: Not on file   Family History  Problem Relation Age of Onset   Colon cancer Neg Hx    Colon polyps Neg Hx    Esophageal cancer Neg Hx    Stomach cancer Neg Hx    Rectal cancer Neg Hx    Past Surgical History:  Procedure Laterality Date   COLONOSCOPY     COLONOSCOPY W/ POLYPECTOMY     HERNIA REPAIR  13/0/86   umbilical hernia and LIH repair    INGUINAL HERNIA REPAIR  11/26/2010   Procedure: HERNIA REPAIR INGUINAL ADULT;  Surgeon: Zenovia Jarred, MD;  Location: [facility];  Service: General;  Laterality: Left;  left inguinal hernia repair with mesh   INGUINAL HERNIA REPAIR Right 02/04/2018   Procedure: REPAIR OF RIGHT INGUINAL HERNIA WITH MESH;  Surgeon: Georganna Skeans, MD;  Location: [facility] [place];  Service: General;  Laterality: Right;    INGUINAL HERNIA REPAIR Right 10/16/2018   Procedure: LAPAROSCOPIC RIGHT INGUINAL HERNIA;  Surgeon: Ralene Ok, MD;  Location: [facility];  Service: General;  Laterality: Right;   INSERTION OF MESH Right 02/04/2018   Procedure: INSERTION OF MESH;  Surgeon: Georganna Skeans, MD;  Location: [facility];  Service: General;  Laterality: Right;   INSERTION OF MESH N/A 10/16/2018   Procedure: Insertion Of Mesh;  Surgeon: Ralene Ok, MD;  Location: [facility];  Service: General;  Laterality: N/A;   TONSILLECTOMY     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  Procedure: LEFT TOTAL HIP ARTHROPLASTY ANTERIOR APPROACH;  Surgeon: Mcarthur Rossetti, MD;  Location: [facility];  Service: Orthopedics;  Laterality: Left;   UMBILICAL HERNIA REPAIR  11/26/2010   Procedure: HERNIA REPAIR UMBILICAL ADULT;  Surgeon: Zenovia Jarred, MD;  Location: [facility];  Service: General;  Laterality: N/A;  umbilical hernia repair with mesh      Vanessa Kick, MD 07/22/20 1111

## 2020-08-24 DIAGNOSIS — R5383 Other fatigue: Secondary | ICD-10-CM | POA: Diagnosis not present

## 2020-08-24 DIAGNOSIS — I10 Essential (primary) hypertension: Secondary | ICD-10-CM | POA: Diagnosis not present

## 2020-08-24 DIAGNOSIS — Z0000 Encounter for general adult medical examination without abnormal findings: Secondary | ICD-10-CM | POA: Diagnosis not present

## 2020-08-24 DIAGNOSIS — E7800 Pure hypercholesterolemia, unspecified: Secondary | ICD-10-CM | POA: Diagnosis not present

## 2020-08-24 DIAGNOSIS — E039 Hypothyroidism, unspecified: Secondary | ICD-10-CM | POA: Diagnosis not present

## 2020-08-24 DIAGNOSIS — J449 Chronic obstructive pulmonary disease, unspecified: Secondary | ICD-10-CM | POA: Diagnosis not present

## 2020-09-07 ENCOUNTER — Encounter: Payer: Self-pay | Admitting: Internal Medicine

## 2020-09-07 ENCOUNTER — Other Ambulatory Visit: Payer: Self-pay

## 2020-09-07 ENCOUNTER — Ambulatory Visit: Payer: Medicare HMO | Admitting: Internal Medicine

## 2020-09-07 ENCOUNTER — Ambulatory Visit (INDEPENDENT_AMBULATORY_CARE_PROVIDER_SITE_OTHER)
Admission: RE | Admit: 2020-09-07 | Discharge: 2020-09-07 | Disposition: A | Discharge status: Home / Self Care | Payer: Medicare HMO | Source: Ambulatory Visit | Attending: Internal Medicine | Admitting: Internal Medicine

## 2020-09-07 VITALS — BP 122/78 | HR 115 | Temp 97.6°F | Ht 69.0 in | Wt 169.5 lb

## 2020-09-07 DIAGNOSIS — R000 Tachycardia, unspecified: Secondary | ICD-10-CM | POA: Diagnosis not present

## 2020-09-07 DIAGNOSIS — J449 Chronic obstructive pulmonary disease, unspecified: Secondary | ICD-10-CM

## 2020-09-07 DIAGNOSIS — J189 Pneumonia, unspecified organism: Secondary | ICD-10-CM | POA: Diagnosis not present

## 2020-09-07 DIAGNOSIS — R059 Cough, unspecified: Secondary | ICD-10-CM | POA: Diagnosis not present

## 2020-09-07 LAB — CBC WITH DIFFERENTIAL/PLATELET
Basophils Absolute: 0 10*3/uL (ref 0.0–0.1)
Basophils Relative: 0.5 % (ref 0.0–3.0)
Eosinophils Absolute: 0.3 10*3/uL (ref 0.0–0.7)
Eosinophils Relative: 4.7 % (ref 0.0–5.0)
HCT: 33.8 % — ABNORMAL LOW (ref 39.0–52.0)
Hemoglobin: 11 g/dL — ABNORMAL LOW (ref 13.0–17.0)
Lymphocytes Relative: 15.9 % (ref 12.0–46.0)
Lymphs Abs: 1.1 10*3/uL (ref 0.7–4.0)
MCHC: 32.4 g/dL (ref 30.0–36.0)
MCV: 83.5 fl (ref 78.0–100.0)
Monocytes Absolute: 0.6 10*3/uL (ref 0.1–1.0)
Monocytes Relative: 8.1 % (ref 3.0–12.0)
Neutro Abs: 4.9 10*3/uL (ref 1.4–7.7)
Neutrophils Relative %: 70.8 % (ref 43.0–77.0)
Platelets: 402 10*3/uL — ABNORMAL HIGH (ref 150.0–400.0)
RBC: 4.06 Mil/uL — ABNORMAL LOW (ref 4.22–5.81)
RDW: 13.7 % (ref 11.5–15.5)
WBC: 6.9 10*3/uL (ref 4.0–10.5)

## 2020-09-07 LAB — BASIC METABOLIC PANEL
BUN: 21 mg/dL (ref 6–23)
CO2: 26 mEq/L (ref 19–32)
Calcium: 9.9 mg/dL (ref 8.4–10.5)
Chloride: 101 mEq/L (ref 96–112)
Creatinine, Ser: 1.39 mg/dL (ref 0.40–1.50)
GFR: 48.22 mL/min — ABNORMAL LOW (ref >60.00)
Glucose, Bld: 99 mg/dL (ref 70–99)
Potassium: 3.5 mEq/L (ref 3.5–5.1)
Sodium: 138 mEq/L (ref 135–145)

## 2020-09-07 LAB — SEDIMENTATION RATE: Sed Rate: 64 mm/hr — ABNORMAL HIGH (ref 0–20)

## 2020-09-07 LAB — TSH: TSH: 1.07 u[IU]/mL (ref 0.35–5.50)

## 2020-09-07 MED ORDER — STIOLTO RESPIMAT 2.5-2.5 MCG/ACT IN AERS: 2.0000 | INHALATION_SPRAY | Freq: Every day | RESPIRATORY_TRACT | 0 refills | Status: DC

## 2020-09-07 NOTE — Assessment & Plan Note (Addendum)
Abn cxr from 07/22/20 when rx as CAP with levaquin 750 x 7 days - cxr improved 09/07/2020  with esr still 64   Most likely has an element of organizing pna in R lung base but no need for further action at this point   >>> f/u final cxr planned / recheck esr at 4 weeks    Each maintenance medication was reviewed in detail including emphasizing most importantly the difference between maintenance and prns and under what circumstances the prns are to be triggered using an action plan format where appropriate.  Total time for H and P, chart review, counseling, reviewing smi device(s) and generating customized AVS unique to this office visit / same day charting > 60 min

## 2020-09-07 NOTE — Progress Notes (Signed)
Brandon Sanford, male    DOB: [birth year],   MRN: [id number]   Brief patient profile:  42 yowm quit smoking in 1997 with GOLD 2 COPD 2008  self referred to pulmonary clinic 09/07/2020 for cough / abn cxr from 07/22/20 when rx as CAP with levaquin 750 x 7 days  Seen 2008   COPD      -PFTs 06/03/07  FEV1 56% ratio 40% 27% improvement after bronchodilators and DLCO 87%    Bronchiectasis by CT 03/02/07 [facility]     History of Present Illness  09/07/2020  Pulmonary/ 1st office eval/[facility]  GOLD ? Still II COPD/bronchiectasis  maint on advair 500   Chief Complaint  Patient presents with   Consult    PNA 07/19/20, more SOB and dry cough since. Using Advair, feels like it helps  Baseline doe x fast walk/ incline on generic  advair 500  Dyspnea:  slow walk maybe a block /no desats  Cough: 24/7 dry hack  Sleep: on  side flat bed  SABA use: duoneb a week prior to OV   Had pleuritic cp R ant/lat resolved now/ no fever   No obvious day to day or daytime variability or assoc excess/ purulent sputum or mucus plugs or hemoptysis or cp or chest tightness, subjective wheeze or overt sinus or hb symptoms.    Also denies any obvious fluctuation of symptoms with weather or environmental changes or other aggravating or alleviating factors except as outlined above   No unusual exposure hx or h/o childhood pna/ asthma or knowledge of premature birth.  Current Allergies, Complete Past Medical History, Past Surgical History, Family History, and Social History were reviewed in [employer] record.  ROS  The following are not active complaints unless bolded Hoarseness, sore throat, dysphagia, dental problems, itching, sneezing,  nasal congestion or discharge of excess mucus or purulent secretions, ear ache,   fever, chills, sweats, unintended wt loss or wt gain, classically pleuritic or exertional cp,  orthopnea pnd or arm/hand swelling  or leg swelling, presyncope, palpitations, abdominal pain,  anorexia, nausea, vomiting, diarrhea  or change in bowel habits or change in bladder habits, change in stools or change in urine, dysuria, hematuria,  rash, arthralgias, visual complaints, headache, numbness, weakness or ataxia or problems with walking or coordination,  change in mood or  memory.           Past Medical History:  Diagnosis Date   Allergy    Arthritis    Asthma    as a child   Cancer ([place])    skin - neck  "pre- cancer"   COPD (chronic obstructive pulmonary disease) ([place])    Depression    pt denies   Dyspnea    With exertion   GERD (gastroesophageal reflux disease)    tums if needed   Hyperlipidemia    Hypertension    Hypothyroidism    Inguinal hernia    Plantar fasciitis    Skin moles    Thyroid disease    Umbilical hernia    repaired    Outpatient Medications Prior to Visit - - NOTE:   Unable to verify as accurately reflecting what pt takes    Medication Sig Dispense Refill   albuterol (VENTOLIN HFA) 108 (90 Base) MCG/ACT inhaler Inhale 1 puff into the lungs as needed.     amLODipine (NORVASC) 5 MG tablet Take 5 mg by mouth daily.     aspirin 81 MG chewable tablet CHEW ONE TABLET  BY MOUTH IN THE MORNING     dextromethorphan-guaiFENesin (MUCINEX DM) 30-600 MG 12hr tablet Take 1 tablet by mouth 2 (two) times daily. 20 tablet 0   diphenhydrAMINE (SOMINEX) 25 MG tablet Take 50 mg by mouth at bedtime.     Fluticasone-Salmeterol (ADVAIR) 500-50 MCG/DOSE AEPB Inhale 500 puffs into the lungs in the morning and at bedtime.     hydrochlorothiazide (MICROZIDE) 12.5 MG capsule Take 12.5 mg by mouth daily.     ipratropium-albuterol (DUONEB) 0.5-2.5 (3) MG/3ML SOLN Take 3 mLs by nebulization every 4 (four) hours as needed. 360 mL 0       0   levothyroxine (SYNTHROID, LEVOTHROID) 100 MCG tablet Take 100 mcg by mouth daily before breakfast.     Multiple Vitamin (MULTIVITAMIN WITH MINERALS) TABS tablet Take 1 tablet by mouth daily.     Multiple Vitamin (MULTIVITAMIN) tablet  Take 1 tablet by mouth daily.     Polyethyl Glycol-Propyl Glycol 0.4-0.3 % SOLN Place 1-2 drops into both eyes 3 (three) times daily as needed (dry eyes/irritated eyes).     Polyethylene Glycol 3350 (MIRALAX PO) Take 119 g by mouth once. colonoscopy            simvastatin (ZOCOR) 40 MG tablet Take 40 mg by mouth every evening.      TUMS 500 MG chewable tablet Chew 500 mg by mouth 2 (two) times daily as needed for indigestion or heartburn.                 Objective:     BP 122/78 (BP Location: Left Arm, Cuff Size: Normal)   Pulse (!) 115   Temp 97.6 F (36.4 C) (Oral)   Ht 5' 9" (1.753 m)   Wt 169 lb 8 oz (76.9 kg)   SpO2 95% Comment: RA  BMI 25.03 kg/m   SpO2: 95 % (RA)  HEENT : pt wearing mask not removed for exam due to covid - 19 concerns.   NECK :  without JVD/Nodes/TM/ nl carotid upstrokes bilaterally   LUNGS: no acc muscle use,  Min barrel  contour chest wall with bilateral  slightly decreased bs s audible wheeze and  without cough on insp or exp maneuvers and min  Hyperresonant  to  percussion bilaterally     CV:  RRR  no s3 or murmur or increase in P2, and no edema   ABD:  soft and nontender with pos end  insp Hoover's  in the supine position. No bruits or organomegaly appreciated, bowel sounds nl  MS:   Nl gait/  ext warm without deformities, calf tenderness, cyanosis or clubbing No obvious joint restrictions   SKIN: warm and dry without lesions    NEURO:  alert, approp, nl sensorium with  no motor or cerebellar deficits apparent.      Labs ordered/ reviewed:      Chemistry      Component Value Date/Time   NA 138 09/07/2020 1428   K 3.5 09/07/2020 1428   CL 101 09/07/2020 1428   CO2 26 09/07/2020 1428   BUN 21 09/07/2020 1428   CREATININE 1.39 09/07/2020 1428      Component Value Date/Time   CALCIUM 9.9 09/07/2020 1428   ALKPHOS 57 05/07/2017 1034   AST 16 05/07/2017 1034   ALT 14 (L) 05/07/2017 1034   BILITOT 0.4 05/07/2017 1034         Lab Results  Component Value Date   WBC 6.9 09/07/2020   HGB 11.0 (L)  09/07/2020   HCT 33.8 (L) 09/07/2020   MCV 83.5 09/07/2020   PLT 402.0 (H) 09/07/2020     No results found for: DDIMER    Lab Results  Component Value Date   TSH 1.07 09/07/2020     No results found for: PROBNP     Lab Results  Component Value Date   ESRSEDRATE 64 (H) 09/07/2020        I personally reviewed images and agree with radiology impression as follows:  CXR:   07/22/20 1. New right base airspace opacity compatible with pneumonia. 2. New nodular density left lung base. CT chest without contrast recommended to further evaluate.   CXR PA and Lateral:   09/07/2020 :    I personally reviewed images  / impression as follows:    Improved aeration R lung base    Assessment   COPD  GOLD II Quit smoking 1997  -  Bronchiectasis by CT 03/02/07 [facility] -   PFTs 06/03/07  FEV1 56% ratio 40% 27% improvement after bronchodilators and DLCO 87% - .09/07/2020  After extensive coaching inhaler device,  effectiveness =    90% > stiolto one each am (concern with heart rate)  And stop advair   High dose advair may actually contributing to his cough at this point so try just low dose stiolto unless breathing gets worse then ok to do the standard 2 puffs each am with prn saba  Re saba use: I spent extra time with pt today reviewing appropriate use of albuterol for prn use on exertion with the following points: 1) saba is for relief of sob that does not improve by walking a slower pace or resting but rather if the pt does not improve after trying this first. 2) If the pt is convinced, as many are, that saba helps recover from activity faster then it's easy to tell if this is the case by re-challenging : ie stop, take the inhaler, then p 5 minutes try the exact same activity (intensity of workload) that just caused the symptoms and see if they are substantially diminished or not after saba 3) if there is an activity that  reproducibly causes the symptoms, try the saba 15 min before the activity on alternate days   If in fact the saba really does help, then fine to continue to use it prn but advised may need to look closer at the maintenance regimen being used to achieve better control of airways disease with exertion.    CAP (community acquired pneumonia) Abn cxr from 07/22/20 when rx as CAP with levaquin 750 x 7 days - cxr improved 09/07/2020  with esr still 64   Most likely has an element of organizing pna in R lung base but no need for further action at this point   >>> f/u final cxr planned / recheck esr at 4 weeks    Tachycardia SR on f/u ekg no ischemic changes - avoid excess saba here.    Each maintenance medication was reviewed in detail including emphasizing most importantly the difference between maintenance and prns and under what circumstances the prns are to be triggered using an action plan format where appropriate.  Total time for H and P, chart review, counseling, reviewing smi device(s) and generating customized AVS unique to this office visit / same day charting > 60 min           Christinia Gully, MD 09/07/2020

## 2020-09-07 NOTE — Assessment & Plan Note (Signed)
Quit smoking 1997  -  Bronchiectasis by CT 03/02/07 [facility] -   PFTs 06/03/07  FEV1 56% ratio 40% 27% improvement after bronchodilators and DLCO 87% - .09/07/2020  After extensive coaching inhaler device,  effectiveness =    90% > stiolto one each am (concern with heart rate)  And stop advair   High dose advair may actually contributing to his cough at this point so try just low dose stiolto unless breathing gets worse then ok to do the standard 2 puffs each am with prn saba  Re saba use: I spent extra time with pt today reviewing appropriate use of albuterol for prn use on exertion with the following points: 1) saba is for relief of sob that does not improve by walking a slower pace or resting but rather if the pt does not improve after trying this first. 2) If the pt is convinced, as many are, that saba helps recover from activity faster then it's easy to tell if this is the case by re-challenging : ie stop, take the inhaler, then p 5 minutes try the exact same activity (intensity of workload) that just caused the symptoms and see if they are substantially diminished or not after saba 3) if there is an activity that reproducibly causes the symptoms, try the saba 15 min before the activity on alternate days   If in fact the saba really does help, then fine to continue to use it prn but advised may need to look closer at the maintenance regimen being used to achieve better control of airways disease with exertion.

## 2020-09-07 NOTE — Patient Instructions (Addendum)
Stop generic advair = xiella 500   Start stiolto one puff each am - if breathing getting worse and you need to use the backup inhaler (albuterol) or nebulizer (albuterol/ipratropium) then increase stiolto to 2 puffs each am   Try prilosec otc 59m  Take 30-60 min before first meal of the day and Pepcid ac (famotidine)  otc 20 mg one @  bedtime until cough is completely gone for at least a week without the need for cough suppression     For cough > ok to try delsym otc 2 tsp every 12 hours and add tessalon 200 mg three times daily if still coughing   Please remember to go to the lab department   for your tests - we will call you with the results when they are available.      Please remember to go to the  x-ray department at our old [facility] [address]  for your study- we will call you with the results when they are available    Please schedule a follow up office visit in 4 weeks, sooner if needed  >>> late add :   f/u final cxr planned / recheck esr at 4 weeks

## 2020-09-07 NOTE — Assessment & Plan Note (Signed)
SR on f/u ekg no ischemic changes - avoid excess saba here.

## 2020-09-08 ENCOUNTER — Encounter: Payer: Self-pay | Admitting: Internal Medicine

## 2020-09-08 ENCOUNTER — Telehealth: Payer: Self-pay | Admitting: Internal Medicine

## 2020-09-08 LAB — IGE: IgE (Immunoglobulin E), Serum: 65 kU/L (ref <=114)

## 2020-09-08 MED ORDER — STIOLTO RESPIMAT 2.5-2.5 MCG/ACT IN AERS: 2.0000 | INHALATION_SPRAY | Freq: Every day | RESPIRATORY_TRACT | 3 refills | Status: AC

## 2020-09-08 NOTE — Telephone Encounter (Signed)
Aware, will address in result note

## 2020-09-08 NOTE — Telephone Encounter (Signed)
Pt stated that he is requesting for his medications to be filled per his OV yesterday with Dr. Melvyn Novas on 09/07/2020.  Per Dr. Melvyn Novas;  "Start stiolto one puff each am - if breathing getting worse and you need to use the backup inhaler (albuterol) or nebulizer (albuterol/ipratropium) then increase stiolto to 2 puffs each am    Try prilosec otc '20mg'$   Take 30-60 min before first meal of the day and Pepcid ac (famotidine)  otc 20 mg one @  bedtime until cough is completely gone for at least a week without the need for cough suppression   For cough > ok to try delsym otc 2 tsp every 12 hours and add tessalon 200 mg three times daily if still coughing "  Pharmacy; [facility] [address]  [place], [place] [address]   [place] regard; [phone]

## 2020-09-08 NOTE — Telephone Encounter (Signed)
Spoke with Tim from [facility] with call report for patient. Will forward to Dr. Melvyn Novas   IMPRESSION: Persistent airspace process at the RIGHT lung base little changed over the 6 week interval. Suspicious given persistence and lack of change.   Nodule at the LEFT lung base.   Given constellation of above findings would suggest chest CT for further evaluation.  Dr. Melvyn Novas please advise

## 2020-09-08 NOTE — Telephone Encounter (Signed)
Call made to patient, confirmed DOB. Made aware the stiolto will be sent in however, the pepcid, omeprazole, and delsym is OTC. Voiced understanding.   Nothing further needed at this time.

## 2020-09-08 NOTE — Telephone Encounter (Signed)
Tin from [facility] is calling with a call report for the pt. Pls regard; [phone].

## 2020-09-08 NOTE — Progress Notes (Signed)
I called and spoke with the patient and he voices understanding. No other concerns.

## 2020-09-11 ENCOUNTER — Telehealth: Payer: Self-pay | Admitting: Internal Medicine

## 2020-09-11 ENCOUNTER — Encounter: Payer: Self-pay | Admitting: *Deleted

## 2020-09-11 MED ORDER — ALBUTEROL SULFATE HFA 108 (90 BASE) MCG/ACT IN AERS: INHALATION_SPRAY | RESPIRATORY_TRACT | 1 refills | Status: DC

## 2020-09-11 NOTE — Telephone Encounter (Signed)
Notified re cxr/ requested refill for ventolin rescue

## 2020-09-12 ENCOUNTER — Telehealth: Payer: Self-pay | Admitting: Internal Medicine

## 2020-09-12 MED ORDER — PREDNISONE 10 MG PO TABS: ORAL_TABLET | ORAL | 0 refills | Status: AC

## 2020-09-12 MED ORDER — AMOXICILLIN-POT CLAVULANATE 875-125 MG PO TABS: 1.0000 | ORAL_TABLET | Freq: Two times a day (BID) | ORAL | 0 refills | Status: DC

## 2020-09-12 NOTE — Telephone Encounter (Signed)
He seemed better at visit and also yesterday when I spoke to him by phone about his cxr so I held off additional rx but clearly not turning the corner as hoped so rec:  Prednisone 10 mg take  4 each am x 2 days,   2 each am x 2 days,  1 each am x 2 days and stop   Augmentin 875 mg take one pill twice daily  X 10 days - take at breakfast and supper with large glass of water.  It would help reduce the usual side effects (diarrhea and yeast infections) if you ate cultured yogurt at lunch.   Ok to use the nebulizer up to every 4 hours if needed   Follow the avs for the cough rx   To er if not responding to above and can't catch his breath at rest.

## 2020-09-12 NOTE — Telephone Encounter (Signed)
Called and spoke with Brandon Sanford letting her know the info stated by Dr. Melvyn Novas and she verbalized understanding. Rx for prednisone taper and augmentin have been sent to preferred pharmacy for pt. Nothing further needed.

## 2020-09-12 NOTE — Telephone Encounter (Signed)
Called and spoke with pt's wife Perrin Smack who states that pt has not been feeling well at all. States that pt has now not been feeling well for about 2-3 months.  Perrin Smack said that overnight last night 8/22, she did check pt's temp and pt did have a temp of 101. Perrin Smack said that pt has had chills for about 2 weeks off and on.  Tylenol was given to pt once they saw that temp.  Asked Perrin Smack if pt was coughing up any phlegm and she said that 1 week ago pt was coughing up phlegm but now pt's cough is a dry cough.  Pt has no appetite and is very fatigued to the point that he is sleeping most of the day now.  Pt is using the Stiolto inhaler every day. Pt used the nebulizer about 2-3 times a day 1 week ago which was not really helping.  With all that has been going on, [place] and pt want to know what can be recommended since this has been going on for at least 3 months now.  Perrin Smack said that if she needed to take pt to UC to be evaluated she would but she wanted to hear from MW first.  Dr. Melvyn Novas, please advise.   Patient Instructions by Tanda Rockers, MD at 09/07/2020 1:30 PM  Author: Tanda Rockers, MD Author Type: Physician Filed: 09/08/2020  5:55 AM  Note Status: Addendum Cosign: Cosign Not Required Encounter Date: 09/07/2020  Editor: Tanda Rockers, MD (Physician)      Prior Versions: 1. Tanda Rockers, MD (Physician) at 09/07/2020  2:05 PM - Addendum   2. Tanda Rockers, MD (Physician) at 09/07/2020  1:49 PM - Signed    Stop generic advair = xiella 500    Start stiolto one puff each am - if breathing getting worse and you need to use the backup inhaler (albuterol) or nebulizer (albuterol/ipratropium) then increase stiolto to 2 puffs each am    Try prilosec otc 37m  Take 30-60 min before first meal of the day and Pepcid ac (famotidine)  otc 20 mg one @  bedtime until cough is completely gone for at least a week without the need for cough suppression      For cough > ok to try delsym otc 2 tsp every 12  hours and add tessalon 200 mg three times daily if still coughing    Please remember to go to the lab department   for your tests - we will call you with the results when they are available.       Please remember to go to the  x-ray department at our old building [address]  for your study- we will call you with the results when they are available     Please schedule a follow up office visit in 4 weeks, sooner if needed  >>> late add :   f/u final cxr planned / recheck esr at 4 weeks

## 2020-10-12 DIAGNOSIS — L578 Other skin changes due to chronic exposure to nonionizing radiation: Secondary | ICD-10-CM | POA: Diagnosis not present

## 2020-10-12 DIAGNOSIS — L988 Other specified disorders of the skin and subcutaneous tissue: Secondary | ICD-10-CM | POA: Diagnosis not present

## 2020-10-12 DIAGNOSIS — C4442 Squamous cell carcinoma of skin of scalp and neck: Secondary | ICD-10-CM | POA: Diagnosis not present

## 2020-10-12 DIAGNOSIS — L814 Other melanin hyperpigmentation: Secondary | ICD-10-CM | POA: Diagnosis not present

## 2020-10-12 DIAGNOSIS — Z85828 Personal history of other malignant neoplasm of skin: Secondary | ICD-10-CM | POA: Diagnosis not present

## 2020-10-17 ENCOUNTER — Other Ambulatory Visit: Payer: Self-pay | Admitting: Internal Medicine

## 2020-10-17 DIAGNOSIS — J189 Pneumonia, unspecified organism: Secondary | ICD-10-CM

## 2020-10-18 ENCOUNTER — Ambulatory Visit (INDEPENDENT_AMBULATORY_CARE_PROVIDER_SITE_OTHER): Payer: Medicare HMO

## 2020-10-18 ENCOUNTER — Other Ambulatory Visit: Payer: Self-pay

## 2020-10-18 ENCOUNTER — Ambulatory Visit: Payer: Medicare HMO | Admitting: Internal Medicine

## 2020-10-18 ENCOUNTER — Encounter: Payer: Self-pay | Admitting: Internal Medicine

## 2020-10-18 VITALS — BP 118/64 | HR 66 | Temp 97.8°F | Ht 69.0 in | Wt 175.4 lb

## 2020-10-18 DIAGNOSIS — J189 Pneumonia, unspecified organism: Secondary | ICD-10-CM

## 2020-10-18 DIAGNOSIS — J9811 Atelectasis: Secondary | ICD-10-CM | POA: Diagnosis not present

## 2020-10-18 DIAGNOSIS — Z23 Encounter for immunization: Secondary | ICD-10-CM

## 2020-10-18 DIAGNOSIS — R911 Solitary pulmonary nodule: Secondary | ICD-10-CM | POA: Diagnosis not present

## 2020-10-18 DIAGNOSIS — J449 Chronic obstructive pulmonary disease, unspecified: Secondary | ICD-10-CM | POA: Diagnosis not present

## 2020-10-18 MED ORDER — STIOLTO RESPIMAT 2.5-2.5 MCG/ACT IN AERS: 2.0000 | INHALATION_SPRAY | Freq: Every day | RESPIRATORY_TRACT | 11 refills | Status: DC

## 2020-10-18 MED ORDER — STIOLTO RESPIMAT 2.5-2.5 MCG/ACT IN AERS: 2.0000 | INHALATION_SPRAY | Freq: Every day | RESPIRATORY_TRACT | 0 refills | Status: DC

## 2020-10-18 NOTE — Assessment & Plan Note (Signed)
Quit smoking 1997 - see cxr 10/18/2020 L[facility] SPN 7 mm not seen on cxr 06/17/20 or ct  05/07/17  >>>  rec f/u cxr in 3 month as likely inflammatory/ related to bronchiectasis same lobe

## 2020-10-18 NOTE — Assessment & Plan Note (Signed)
Quit smoking 1997  -  Bronchiectasis by CT 03/02/07 [facility] -   PFTs 06/03/07  FEV1 56% ratio 40% 27% improvement after bronchodilators and DLCO 87% -   09/07/2020  After extensive coaching inhaler device,  effectiveness =    90% > stiolto one each am (concern with heart rate)  And stop advair  -  Allergy profile 09/07/20 >  Eos 0.3 /  IgE  65  Pt is Group B in terms of symptom/risk and laba/lama therefore appropriate rx at this point >>>  stiolto 1 or 2 puffs each am

## 2020-10-18 NOTE — Patient Instructions (Addendum)
For cough > mucinex dm up to 1200 mg every 12 hours as needed  Try prilosec otc 20mg   Take 30-60 min before first meal of the day and Pepcid ac (famotidine) 20 mg one @  bedtime until cough is completely gone for at least a week without the need for cough suppression - if get worse restart  I recommend the new omicron vaccine   If not doing better remember stiolto 2 puffs each am   Please schedule a follow up visit in 6 months but call sooner if needed   Late add:  change f/u to 3 m  with cxr

## 2020-10-18 NOTE — Assessment & Plan Note (Addendum)
Bronchiectasis by CT 03/02/07 LLL - abn cxr from 07/22/20 when rx as CAP with levaquin 750 x 7 days - cxr improved 09/07/2020 with esr still 64 > resolved 10/18/2020   Still some lingering cough ? Related to gerd since stopped rx prior to recurrence and cxr now clear   rec mucinex dm 1200 mg every 12 hours as needed  Restart gerd rx  Call if not better, o/w f/u in 6 m          Each maintenance medication was reviewed in detail including emphasizing most importantly the difference between maintenance and prns and under what circumstances the prns are to be triggered using an action plan format where appropriate.  Total time for H and P, chart review, counseling, reviewing [facility] device(s) and generating customized AVS unique to this office visit / same day charting  > 30 min

## 2020-10-18 NOTE — Progress Notes (Addendum)
Brandon Sanford, male    DOB: 03-21-1940,   MRN: [id number]   Brief patient profile:  43 yowm quit smoking in 1997 with GOLD 2 COPD 2008  self referred to pulmonary clinic 09/07/2020 for cough / abn cxr from 07/22/20 when rx as CAP with levaquin 750 x 7 days  Seen 2008   COPD      -PFTs 06/03/07  FEV1 56% ratio 40% 27% improvement after bronchodilators and DLCO 87%    Bronchiectasis by CT 03/02/07 L[facility]     History of Present Illness  09/07/2020  Pulmonary/ 1st office eval/Brandon Sanford  GOLD ? Still II COPD/bronchiectasis  maint on advair 500   Chief Complaint  Patient presents with   Consult    PNA 07/19/20, more SOB and dry cough since. Using Advair, feels like it helps  Baseline doe x fast walk/ incline on generic  advair 500  Dyspnea:  slow walk maybe a block /no desats  Cough: 24/7 dry hack  Sleep: on  side flat bed  SABA use: duoneb a week prior to OV   Had pleuritic cp R ant/lat resolved now/ no fever  Rec Stop generic advair = xiella 500  Start stiolto one puff each am - if breathing getting worse and you need to use the backup inhaler (albuterol) or nebulizer (albuterol/ipratropium) then increase stiolto to 2 puffs each am  Try prilosec otc 21m  Take 30-60 min before first meal of the day and Pepcid ac (famotidine)  otc 20 mg one @  bedtime until cough is completely gone for at least a week without the need for cough suppression For cough > ok to try delsym otc 2 tsp every 12 hours and add tessalon 200 mg three times daily if still coughing  Please schedule a follow up office visit in 4 weeks, sooner if needed  >>> late add :   f/u final cxr planned / recheck esr at 4 weeks     10/18/2020  f/u ov/Brandon Sanford re: GOLD ? Still II COPD/bronchiectasis maint on stiolto 1 puff each am   Chief Complaint  Patient presents with   Follow-up   Dyspnea:  walks neighborhood x 15 min  = MMRC1 = can walk nl pace, flat grade, can't hurry or go uphills or steps s sob   Cough: min rattling daytime / better  sleeping s cough waking  > no mucus / stopped gerd rx prior to cough recurrence sev week prior to OV   Sleeping: bed is flat, sleeps on side/ one pillow SABA use: none  02: none  Covid status:   vax x 3 total    No obvious day to day or daytime variability or assoc excess/ purulent sputum or mucus plugs or hemoptysis or cp or chest tightness, subjective wheeze or overt sinus or hb symptoms.   Sleeping  without nocturnal  or early am exacerbation  of respiratory  c/o's or need for noct saba. Also denies any obvious fluctuation of symptoms with weather or environmental changes or other aggravating or alleviating factors except as outlined above   No unusual exposure hx or h/o childhood pna/ asthma or knowledge of premature birth.  Current Allergies, Complete Past Medical History, Past Surgical History, Family History, and Social History were reviewed in [employer].  ROS  The following are not active complaints unless bolded Hoarseness, sore throat, dysphagia, dental problems, itching, sneezing,  nasal congestion or discharge of excess mucus or purulent secretions, ear ache,  fever, chills, sweats, unintended wt loss or wt gain, classically pleuritic or exertional cp,  orthopnea pnd or arm/hand swelling  or leg swelling, presyncope, palpitations, abdominal pain, anorexia, nausea, vomiting, diarrhea  or change in bowel habits or change in bladder habits, change in stools or change in urine, dysuria, hematuria,  rash, arthralgias, visual complaints, headache, numbness, weakness or ataxia or problems with walking or coordination,  change in mood or  memory.        Current Meds  Medication Sig   albuterol (VENTOLIN HFA) 108 (90 Base) MCG/ACT inhaler 2 puffs every 4 hours if you can't catch your breath   amLODipine (NORVASC) 5 MG tablet Take 5 mg by mouth daily.   hydrochlorothiazide (MICROZIDE) 12.5 MG capsule Take 12.5 mg by mouth daily.   Multiple Vitamin  (MULTIVITAMIN WITH MINERALS) TABS tablet Take 1 tablet by mouth daily.   Multiple Vitamin (MULTIVITAMIN) tablet Take 1 tablet by mouth daily.   simvastatin (ZOCOR) 40 MG tablet Take 40 mg by mouth every evening.                  Past Medical History:  Diagnosis Date   Allergy    Arthritis    Asthma    as a child   Cancer ([place])    skin - neck  "pre- cancer"   COPD (chronic obstructive pulmonary disease) ([place])    Depression    pt denies   Dyspnea    With exertion   GERD (gastroesophageal reflux disease)    tums if needed   Hyperlipidemia    Hypertension    Hypothyroidism    Inguinal hernia    Plantar fasciitis    Skin moles    Thyroid disease    Umbilical hernia    repaired         Objective:       Wt Readings from Last 3 Encounters:  10/18/20 175 lb 6.4 oz (79.6 kg)  09/07/20 169 lb 8 oz (76.9 kg)  03/23/20 178 lb (80.7 kg)      Vital signs reviewed  10/18/2020  - Note at rest 02 sats  96% on RA   General appearance:    robust amb wm nad   HEENT : pt wearing mask not removed for exam due to covid - 19 concerns.    NECK :  without JVD/Nodes/TM/ nl carotid upstrokes bilaterally   LUNGS: no acc muscle use,  Mild barrel  contour chest wall with bilateral  Distant bs s audible wheeze and  without cough on insp or exp maneuvers  and mild  Hyperresonant  to  percussion bilaterally     CV:  RRR  no s3 or murmur or increase in P2, and no edema   ABD:  soft and nontender with pos end  insp Hoover's  in the supine position. No bruits or organomegaly appreciated, bowel sounds nl  MS:   Nl gait/  ext warm without deformities, calf tenderness, cyanosis or clubbing No obvious joint restrictions   SKIN: warm and dry without lesions    NEURO:  alert, approp, nl sensorium with  no motor or cerebellar deficits apparent.        CXR PA and Lateral:   10/18/2020 :    I personally reviewed images and   impression as follows:     No longer any as dz / LLL SPN 7 mm  not seen on cxr 06/17/20 or ct  05/07/17  Assessment

## 2020-11-21 ENCOUNTER — Telehealth: Payer: Self-pay | Admitting: Internal Medicine

## 2020-11-21 NOTE — Telephone Encounter (Signed)
Call returned to patient, confirmed DOB. Confirmed fax number. Script faxed.   Nothing further needed at this time.

## 2020-12-20 ENCOUNTER — Encounter: Payer: Self-pay | Admitting: Emergency Medicine

## 2020-12-20 ENCOUNTER — Ambulatory Visit
Admission: EM | Admit: 2020-12-20 | Discharge: 2020-12-20 | Disposition: A | Discharge status: Home / Self Care | Payer: Medicare HMO | Attending: Physician Assistant | Admitting: Physician Assistant

## 2020-12-20 ENCOUNTER — Ambulatory Visit (INDEPENDENT_AMBULATORY_CARE_PROVIDER_SITE_OTHER): Payer: Medicare HMO

## 2020-12-20 ENCOUNTER — Other Ambulatory Visit: Payer: Self-pay

## 2020-12-20 DIAGNOSIS — J441 Chronic obstructive pulmonary disease with (acute) exacerbation: Secondary | ICD-10-CM

## 2020-12-20 DIAGNOSIS — R059 Cough, unspecified: Secondary | ICD-10-CM | POA: Diagnosis not present

## 2020-12-20 MED ORDER — PREDNISONE 20 MG PO TABS: 40.0000 mg | ORAL_TABLET | Freq: Every day | ORAL | 0 refills | Status: AC

## 2020-12-20 NOTE — ED Provider Notes (Signed)
[facility]    CSN: [id number] Arrival date & time: 12/20/20  1442      History   Chief Complaint Chief Complaint  Patient presents with   Cough    HPI Brandon Sanford is a 80 y.o. male.   Patient here today for evaluation of cough he has had for the last several weeks.  He reports that over the last 3 days he has had worsening symptoms and more difficulty sleeping.  He states cough is productive.  He does have shortness of breath.  He has known COPD.  He does not report any fever.  He denies any sore throat.  He has not had any vomiting or diarrhea.  The history is provided by the patient.  Cough Associated symptoms: shortness of breath   Associated symptoms: no ear pain, no eye discharge, no fever and no sore throat    Past Medical History:  Diagnosis Date   Allergy    Arthritis    Asthma    as a child   Cancer ([place])    skin - neck  "pre- cancer"   COPD (chronic obstructive pulmonary disease) ([place])    Depression    pt denies   Dyspnea    With exertion   GERD (gastroesophageal reflux disease)    tums if needed   Hyperlipidemia    Hypertension    Hypothyroidism    Inguinal hernia    Plantar fasciitis    Skin moles    Thyroid disease    Umbilical hernia    repaired    Patient Active Problem List   Diagnosis Date Noted   Tachycardia 09/07/2020   CAP (community acquired pneumonia) 09/07/2020   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   Pain in 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   Unilateral primary osteoarthritis, left hip 18/29/9371   Umbilical pain 69/67/8938   COPD  GOLD II 06/03/2007   Solitary pulmonary nodule 04/27/2007   DYSPNEA 04/27/2007   HYPOTHYROIDISM 04/24/2007   DEPRESSION 04/24/2007   HYPERTENSION 04/24/2007    Past Surgical History:  Procedure Laterality Date   COLONOSCOPY     COLONOSCOPY W/ POLYPECTOMY     HERNIA REPAIR  10/21/73   umbilical hernia and LIH repair    INGUINAL HERNIA REPAIR  11/26/2010   Procedure: HERNIA  REPAIR INGUINAL ADULT;  Surgeon: Zenovia Jarred, MD;  Location: [facility];  Service: General;  Laterality: Left;  left inguinal hernia repair with mesh   INGUINAL HERNIA REPAIR Right 02/04/2018   Procedure: REPAIR OF RIGHT INGUINAL HERNIA WITH MESH;  Surgeon: Georganna Skeans, MD;  Location: [facility];  Service: General;  Laterality: Right;   INGUINAL HERNIA REPAIR Right 10/16/2018   Procedure: LAPAROSCOPIC RIGHT INGUINAL HERNIA;  Surgeon: Ralene Ok, MD;  Location: [facility];  Service: General;  Laterality: Right;   INSERTION OF MESH Right 02/04/2018   Procedure: INSERTION OF MESH;  Surgeon: Georganna Skeans, MD;  Location: [facility];  Service: General;  Laterality: Right;   INSERTION OF MESH N/A 10/16/2018   Procedure: Insertion Of Mesh;  Surgeon: Ralene Ok, MD;  Location: [facility];  Service: General;  Laterality: N/A;   TONSILLECTOMY     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   Procedure: LEFT TOTAL HIP ARTHROPLASTY ANTERIOR APPROACH;  Surgeon: Mcarthur Rossetti, MD;  Location: [facility];  Service: Orthopedics;  Laterality: Left;   UMBILICAL HERNIA REPAIR  11/26/2010   Procedure: HERNIA REPAIR UMBILICAL ADULT;  Surgeon: Zenovia Jarred, MD;  Location: [facility];  Service: General;  Laterality: N/A;  umbilical hernia repair with mesh       Home Medications    Prior to Admission medications   Medication Sig Start Date End Date Taking? Authorizing Provider  albuterol (VENTOLIN HFA) 108 (90 Base) MCG/ACT inhaler 2 puffs every 4 hours if you can't catch your breath 09/11/20  Yes Tanda Rockers, MD  amLODipine (NORVASC) 5 MG tablet Take 5 mg by mouth daily.   Yes [provider]  hydrochlorothiazide (MICROZIDE) 12.5 MG capsule Take 12.5 mg by mouth daily.   Yes [provider]  predniSONE (DELTASONE) 20 MG tablet Take 2 tablets (40 mg total) by mouth daily with breakfast for 5 days. 12/20/20 12/25/20 Yes Francene Finders, PA-C  simvastatin (ZOCOR)  40 MG tablet Take 40 mg by mouth every evening.    Yes [provider]  Tiotropium Bromide-Olodaterol (STIOLTO RESPIMAT) 2.5-2.5 MCG/ACT AERS Inhale 2 puffs into the lungs daily. 10/18/20  Yes Tanda Rockers, MD  Tiotropium Bromide-Olodaterol (STIOLTO RESPIMAT) 2.5-2.5 MCG/ACT AERS Inhale 2 puffs into the lungs daily. 10/18/20  Yes Tanda Rockers, MD  aspirin 81 MG chewable tablet CHEW ONE TABLET BY MOUTH IN THE MORNING Patient not taking: Reported on 10/18/2020 12/12/09   [provider]  dextromethorphan-guaiFENesin (MUCINEX DM) 30-600 MG 12hr tablet Take 1 tablet by mouth 2 (two) times daily. Patient not taking: Reported on 10/18/2020 06/17/20   Wieters, Madelynn Done C, PA-C  diphenhydrAMINE (SOMINEX) 25 MG tablet Take 50 mg by mouth at bedtime. Patient not taking: Reported on 10/18/2020    [provider]  ipratropium-albuterol (DUONEB) 0.5-2.5 (3) MG/3ML SOLN Take 3 mLs by nebulization every 4 (four) hours as needed. Patient not taking: Reported on 10/18/2020 06/17/20   Wieters, Madelynn Done C, PA-C  levothyroxine (SYNTHROID, LEVOTHROID) 100 MCG tablet Take 100 mcg by mouth daily before breakfast. Patient not taking: Reported on 10/18/2020    [provider]  Multiple Vitamin (MULTIVITAMIN WITH MINERALS) TABS tablet Take 1 tablet by mouth daily.    [provider]  Multiple Vitamin (MULTIVITAMIN) tablet Take 1 tablet by mouth daily.    [provider]  Polyethyl Glycol-Propyl Glycol 0.4-0.3 % SOLN Place 1-2 drops into both eyes 3 (three) times daily as needed (dry eyes/irritated eyes). Patient not taking: Reported on 10/18/2020    [provider]  Polyethylene Glycol 3350 (MIRALAX PO) Take 119 g by mouth once. colonoscopy Patient not taking: Reported on 10/18/2020    [provider]  TUMS 500 MG chewable tablet Chew 500 mg by mouth 2 (two) times daily as needed for indigestion or heartburn. Patient not taking: Reported on 10/18/2020 04/19/17    [provider]    Family History Family History  Problem Relation Age of Onset   Colon cancer Neg Hx    Colon polyps Neg Hx    Esophageal cancer Neg Hx    Stomach cancer Neg Hx    Rectal cancer Neg Hx     Social History Social History   Tobacco Use   Smoking status: Former    Years: 40.00    Types: Cigarettes    Quit date: 11/12/1995    Years since quitting: 25.1   Smokeless tobacco: Never  Vaping Use   Vaping Use: Never used  Substance Use Topics   Alcohol use: Yes    Alcohol/week: 2.0 standard drinks    Types: 2 Cans of beer per week   Drug use: No  Allergies   Lisinopril, Chlorhexidine, and Pneumococcal vaccines   Review of Systems Review of Systems  Constitutional:  Negative for fever.  HENT:  Positive for congestion. Negative for ear pain and sore throat.   Eyes:  Negative for discharge and redness.  Respiratory:  Positive for cough and shortness of breath.   Gastrointestinal:  Negative for abdominal pain, diarrhea, nausea and vomiting.    Physical Exam Triage Vital Signs ED Triage Vitals  Enc Vitals Group     BP 12/20/20 1553 (!) 162/88     Pulse Rate 12/20/20 1553 69     Resp --      Temp 12/20/20 1553 97.8 F (36.6 C)     Temp Source 12/20/20 1553 Oral     SpO2 12/20/20 1553 95 %     Weight --      Height --      Head Circumference --      Peak Flow --      Pain Score 12/20/20 1554 0     Pain Loc --      Pain Edu? --      Excl. in [place]? --    No data found.  Updated Vital Signs BP (!) 162/88 (BP Location: Left Arm)   Pulse 69   Temp 97.8 F (36.6 C) (Oral)   SpO2 95%      Physical Exam Vitals and nursing note reviewed.  Constitutional:      General: He is not in acute distress.    Appearance: Normal appearance. He is not ill-appearing.  HENT:     Head: Normocephalic and atraumatic.     Nose: Congestion present.  Eyes:     Conjunctiva/sclera: Conjunctivae normal.  Cardiovascular:     Rate and Rhythm: Normal rate  and regular rhythm.     Heart sounds: Normal heart sounds. No murmur heard. Pulmonary:     Effort: Pulmonary effort is normal. No respiratory distress.     Breath sounds: Normal breath sounds. No wheezing, rhonchi or rales.  Skin:    General: Skin is warm and dry.  Neurological:     Mental Status: He is alert.  Psychiatric:        Mood and Affect: Mood normal.        Thought Content: Thought content normal.     UC Treatments / Results  Labs (all labs ordered are listed, but only abnormal results are displayed) Labs Reviewed  COVID-19, FLU A+B NAA    EKG   Radiology DG Chest 2 View  Result Date: 12/20/2020 CLINICAL DATA:  Productive cough for several weeks. EXAM: CHEST - 2 VIEW COMPARISON:  10/18/2020 FINDINGS: The cardiomediastinal silhouette is unchanged with normal heart size. Aortic atherosclerosis is noted. The lungs are hyperinflated with bibasilar scarring. The subcentimeter nodular density projecting over the left lung base on the prior study is not well seen on the current examination. No acute airspace consolidation, edema, pleural effusion, pneumothorax is identified. No acute osseous abnormality is seen. IMPRESSION: No active cardiopulmonary disease. Electronically Signed   By: Logan Bores M.D.   On: 12/20/2020 16:18    Procedures Procedures (including critical care time)  Medications Ordered in UC Medications - No data to display  Initial Impression / Assessment and Plan / UC Course  I have reviewed the triage vital signs and the nursing notes.  Pertinent labs & imaging results that were available during my care of the patient were reviewed by me and considered in my medical decision making (  see chart for details).    Chest x-ray ordered given recent pneumonia.  No concerning findings.  Will treat to cover COPD exacerbation with prednisone burst.  Screening for COVID and flu also ordered.  Recommend follow-up with any further concerns.  Final Clinical  Impressions(s) / UC Diagnoses   Final diagnoses:  COPD exacerbation [facility])   Discharge Instructions   None    ED Prescriptions     Medication Sig Dispense Auth. Provider   predniSONE (DELTASONE) 20 MG tablet Take 2 tablets (40 mg total) by mouth daily with breakfast for 5 days. 10 tablet Francene Finders, PA-C      PDMP not reviewed this encounter.   Francene Finders, PA-C 12/20/20 [phone]

## 2020-12-20 NOTE — ED Triage Notes (Signed)
Patient c/o productive cough for several weeks, congestion, recently dx w/pneumonia.  Trouble sleeping unsure if it's the cough or not.  Just started having problems sleeping x 3 nights.

## 2020-12-21 LAB — COVID-19, FLU A+B NAA
Influenza A, NAA: NOT DETECTED
Influenza B, NAA: NOT DETECTED
SARS-CoV-2, NAA: NOT DETECTED

## 2020-12-28 ENCOUNTER — Telehealth: Payer: Self-pay | Admitting: Critical Care Medicine

## 2020-12-29 ENCOUNTER — Other Ambulatory Visit: Payer: Self-pay

## 2020-12-29 ENCOUNTER — Other Ambulatory Visit (INDEPENDENT_AMBULATORY_CARE_PROVIDER_SITE_OTHER): Payer: Medicare HMO

## 2020-12-29 ENCOUNTER — Telehealth: Payer: Self-pay | Admitting: Internal Medicine

## 2020-12-29 ENCOUNTER — Encounter: Payer: Self-pay | Admitting: Nurse Practitioner

## 2020-12-29 ENCOUNTER — Ambulatory Visit: Payer: Medicare HMO | Admitting: Nurse Practitioner

## 2020-12-29 VITALS — BP 144/76 | HR 66 | Temp 98.1°F | Ht 69.0 in | Wt 174.0 lb

## 2020-12-29 DIAGNOSIS — J309 Allergic rhinitis, unspecified: Secondary | ICD-10-CM

## 2020-12-29 DIAGNOSIS — Z87891 Personal history of nicotine dependence: Secondary | ICD-10-CM | POA: Diagnosis not present

## 2020-12-29 DIAGNOSIS — R0602 Shortness of breath: Secondary | ICD-10-CM | POA: Diagnosis not present

## 2020-12-29 DIAGNOSIS — R000 Tachycardia, unspecified: Secondary | ICD-10-CM

## 2020-12-29 DIAGNOSIS — J449 Chronic obstructive pulmonary disease, unspecified: Secondary | ICD-10-CM

## 2020-12-29 DIAGNOSIS — K219 Gastro-esophageal reflux disease without esophagitis: Secondary | ICD-10-CM | POA: Diagnosis not present

## 2020-12-29 DIAGNOSIS — I10 Essential (primary) hypertension: Secondary | ICD-10-CM | POA: Diagnosis not present

## 2020-12-29 DIAGNOSIS — R42 Dizziness and giddiness: Secondary | ICD-10-CM | POA: Diagnosis not present

## 2020-12-29 DIAGNOSIS — T486X5A Adverse effect of antiasthmatics, initial encounter: Secondary | ICD-10-CM

## 2020-12-29 DIAGNOSIS — R0982 Postnasal drip: Secondary | ICD-10-CM

## 2020-12-29 DIAGNOSIS — T50905A Adverse effect of unspecified drugs, medicaments and biological substances, initial encounter: Secondary | ICD-10-CM | POA: Diagnosis not present

## 2020-12-29 LAB — BASIC METABOLIC PANEL
BUN: 19 mg/dL (ref 6–23)
CO2: 27 mEq/L (ref 19–32)
Calcium: 10.4 mg/dL (ref 8.4–10.5)
Chloride: 102 mEq/L (ref 96–112)
Creatinine, Ser: 1.18 mg/dL (ref 0.40–1.50)
GFR: 58.56 mL/min — ABNORMAL LOW (ref >60.00)
Glucose, Bld: 106 mg/dL — ABNORMAL HIGH (ref 70–99)
Potassium: 3.8 mEq/L (ref 3.5–5.1)
Sodium: 139 mEq/L (ref 135–145)

## 2020-12-29 LAB — D-DIMER, QUANTITATIVE: D-Dimer, Quant: 0.44 mcg/mL FEU (ref <0.50)

## 2020-12-29 MED ORDER — PANTOPRAZOLE SODIUM 40 MG PO TBEC
40.0000 mg | DELAYED_RELEASE_TABLET | Freq: Every day | ORAL | 2 refills | Status: DC
Start: 2020-12-29 — End: 2021-01-23

## 2020-12-29 MED ORDER — LEVALBUTEROL TARTRATE 45 MCG/ACT IN AERO: 2.0000 | INHALATION_SPRAY | RESPIRATORY_TRACT | 2 refills | Status: DC | PRN

## 2020-12-29 MED ORDER — BREZTRI AEROSPHERE 160-9-4.8 MCG/ACT IN AERO: 2.0000 | INHALATION_SPRAY | Freq: Two times a day (BID) | RESPIRATORY_TRACT | 0 refills | Status: DC

## 2020-12-29 MED ORDER — FLUTICASONE PROPIONATE 50 MCG/ACT NA SUSP: 1.0000 | Freq: Every day | NASAL | 2 refills | Status: DC

## 2020-12-29 MED ORDER — LORATADINE 10 MG PO TABS: 10.0000 mg | ORAL_TABLET | Freq: Every day | ORAL | 2 refills | Status: DC

## 2020-12-29 MED ORDER — LEVALBUTEROL TARTRATE 45 MCG/ACT IN AERO
2.0000 | INHALATION_SPRAY | RESPIRATORY_TRACT | 2 refills | Status: DC | PRN
Start: 2020-12-29 — End: 2020-12-29

## 2020-12-29 MED ORDER — BREZTRI AEROSPHERE 160-9-4.8 MCG/ACT IN AERO: 2.0000 | INHALATION_SPRAY | Freq: Two times a day (BID) | RESPIRATORY_TRACT | 3 refills | Status: DC

## 2020-12-29 MED ORDER — PREDNISONE 10 MG PO TABS: ORAL_TABLET | ORAL | 0 refills | Status: DC

## 2020-12-29 MED ORDER — LEVALBUTEROL HCL 0.63 MG/3ML IN NEBU: 0.6300 mg | INHALATION_SOLUTION | RESPIRATORY_TRACT | 12 refills | Status: DC | PRN

## 2020-12-29 MED ORDER — LEVALBUTEROL HCL 0.63 MG/3ML IN NEBU
0.6300 mg | INHALATION_SOLUTION | RESPIRATORY_TRACT | 12 refills | Status: DC | PRN
Start: 2020-12-29 — End: 2020-12-29

## 2020-12-29 NOTE — Assessment & Plan Note (Signed)
40 pack year history. CXR in July 2022 showed new nodular density left lung base; however, this was not visualized on repeat 12/20/2020. Referral to lung cancer screening program.

## 2020-12-29 NOTE — Telephone Encounter (Signed)
Ok to try off the stiolto and just use albuterol prn until next ov (be sure has f/u with all active meds in hand as may be an interaction with another med)

## 2020-12-29 NOTE — Telephone Encounter (Signed)
Spoke with pt and reviewed Dr. Gustavus Bryant recommendations. Pt stated understanding. Nothing further needed at this time.

## 2020-12-29 NOTE — Assessment & Plan Note (Signed)
See above plan. 

## 2020-12-29 NOTE — Assessment & Plan Note (Addendum)
Suspect his symptoms likely related to tachycardia/palpitations caused by albuterol as he has been doing duonebs twice daily and again experienced symptoms after his albuterol inhaler for the first time this morning. His symptoms have since subsided. EKG today showed sinus arrhythmia with regular rate and normal p waves, no ST changes, and normal QTc. D dimer negative. Will change to xopenex rescue inhaler and nebs. If problem occurs again, will need presyncopal workup.   Patient Instructions  Stop taking ipatropium/albuterol nebulizers and albuterol inhaler.  -Xopenex inhaler 2 puffs or 3 mL neb every 6 hours as needed for shortness of breath or wheezing -Breztri 160 2 puffs Twice daily, brush tongue and rinse after -Prednisone taper pack. 4 tabs for 2 days, then 3 tabs for 2 days, 2 tabs for 2 days, then 1 tab for 2 days, then stop. Take in AM with breakfast.  -Protonix 40 mg daily  -Claritin (loratidine) 10 mg daily  -Flonase nasal spray 1-2 sprays each nostril daily   -Saline nasal spray 2-3 times a day as needed for nasal congestion/postnasal drip until symptoms improve -Mucinex 600 mg twice daily as needed until symptoms improve  Walking oximetry today SpO2 low 94% on room air  EKG with sinus arrhythmia but with a normal rate.   Pulmonary function testing scheduled today  Labs today including d dimer, BMET, and CBC with diff  Follow up in 2 weeks with Dr. Melvyn Novas, Roxan Diesel, NP or APP. If symptoms do not improve or worsen, please contact office for sooner follow up or seek emergency care.

## 2020-12-29 NOTE — Telephone Encounter (Signed)
Pt states he did not sleep last night. Spoke with doctor on call last night and he believes he was supposed to get  a new inhaler from this call. Pt states he's a having a reaction to this current inhaler. Tingling all over, unable to sleep and very difficult all over. Please advise.

## 2020-12-29 NOTE — Telephone Encounter (Signed)
I have called the pt and he stated that he was seen at the [facility] today due to feeling a weird kind of way.  He stated that they did call EMS and they feel that this is related to his inhaler.  He was offered appt today at 2 with [facility] and he will bring all him medications with him to discuss

## 2020-12-29 NOTE — Patient Instructions (Addendum)
Stop taking ipatropium/albuterol nebulizers and albuterol inhaler.  -Xopenex inhaler 2 puffs or 3 mL neb every 6 hours as needed for shortness of breath or wheezing -Breztri 160 2 puffs Twice daily, brush tongue and rinse after -Prednisone taper pack. 4 tabs for 2 days, then 3 tabs for 2 days, 2 tabs for 2 days, then 1 tab for 2 days, then stop. Take in AM with breakfast.  -Protonix 40 mg daily  -Claritin (loratidine) 10 mg daily  -Flonase nasal spray 1-2 sprays each nostril daily   -Saline nasal spray 2-3 times a day as needed for nasal congestion/postnasal drip until symptoms improve -Mucinex 600 mg twice daily as needed until symptoms improve  Walking oximetry today SpO2 low 94% on room air  EKG with sinus arrhythmia but with a normal rate.   Pulmonary function testing scheduled today  Labs today including d dimer, BMET, and CBC with diff  Follow up in 2 weeks with Dr. Melvyn Novas, Roxan Diesel, NP or APP. If symptoms do not improve or worsen, please contact office for sooner follow up or seek emergency care.

## 2020-12-29 NOTE — Telephone Encounter (Signed)
Spoke with pt who states he spoke with on call doc last night r/t not being able to sleep and tingling feeling. Pt states he was not able to sleep at all last night. Pt states he has only been taking 1 puff in the morning of his Stiolto and is having these issues. Dr. Melvyn Novas please advise.

## 2020-12-29 NOTE — Assessment & Plan Note (Signed)
Continues to experience high symptom burden. Eos 300. Step up to triple therapy. Breztri sample provided and rx sent. Will obtain repeat PFTs as last ones were in 2009. See above plan.

## 2020-12-29 NOTE — Telephone Encounter (Signed)
Spoke with pt and he has appt today at 2.

## 2020-12-29 NOTE — Assessment & Plan Note (Signed)
Occasional fullness sensation and reflux symptoms. Takes Tums with some relief. Protonix 40 mg daily. Could be contributing to chronic cough. See above plan.

## 2020-12-29 NOTE — Progress Notes (Addendum)
@Patient  ID: Brandon Sanford, male    DOB: [birth year], 80 y.o.   MRN: [id number]  Chief Complaint  Patient presents with   Follow-up    Breathing is overall doing well. Went to UC approx 2 wks ago and was txed with respiratory infection- given pred. He still has prod cough with yellow sputum. He rarely uses his rescue inhaler.     Referring provider: Alroy Dust, L.Marlou Sa, MD  HPI: 80 year old male, former smoker (40 pack years) followed for COPD GOLD II and bronchiectasis.  He is a patient of Dr. Gustavus Bryant and was last seen in office on 10/18/2020.  Past medical history significant for hypertension, recent pneumonia (June 2022), osteoarthritis, depression.  TEST/EVENTS:  06/03/2007 PFTs: FEV1 56%, ratio 40%, 27% improvement after bronchodilators and DLCO 87% 03/02/2007 CT chest: Bronchiectasis left lower lobe 07/22/2020 CXR 2 view: Right base airspace opacity compatible with pneumonia.  New nodular density left lung base. 12/20/2020 CXR 2 view: Atherosclerosis.  The lungs are hyperinflated with bibasilar scarring.  Previous subcentimeter nodular density of the left lung base not well seen on current exam.  No acute airspace consolidation, edema, pleural effusion, pneumothorax. 09/07/2020 CBC: Hemoglobin 11, HCT 33.8, eos 300 09/07/2020 IgE: 65  12/20/2020: ED for COPD exacerbation.  CXR negative.  Prednisone 40 mg x 5 days.  12/29/2020: Today-acute visit Patient presents today with his wife for reported reaction to his inhaler therapy. He believes it is related to his Stiolto inhaler after he got his new inhaler from the [place] pharmacy. His symptoms began last night and he reported dizzy and lightheadedness, tingling, and feeling jittery. He did use his Stiolto inhaler last night as well as his Duoneb nebulizer treatment, which he has been doing twice a day for 4-5 days. His wife contacted the fire department who evaluated him. His symptoms had improved at this point. He was instructed to use his albuterol  rescue inhaler if he experienced shortness of breath upon exertion and was told that his reaction was likely to a component in his Stiolto inhaler, which he has not used since. He reports using his albuterol inhaler this morning, because of shortness of breath, and not long after, experienced similar symptoms. He has shortness of breath at baseline, primarily with exertion and felt little improvement on the Stiolto. He also has a chronic productive cough with white to yellow sputum production. He denies any increase in his cough or SOB. He denies fevers, chills, orthopnea, PND, lower extremity swelling, chest pain or palpitations. He denies any recent travel, prolonged immobilization, or injury. He does have some nasal congestion with occasional clear rhinorrhea. He takes mucinex DM Twice daily with minimal symptom relief. He is not on any control medications for GERD or rhinitis. Overall, he feels better but is concerned about his medication reaction.    Allergies  Allergen Reactions   Lisinopril Cough   Chlorhexidine Rash   Pneumococcal Vaccines Rash    Immunization History  Administered Date(s) Administered   Fluad Quad(high Dose 65+) 10/18/2020   Influenza, High Dose Seasonal PF 09/26/2016, 10/02/2018   Influenza-Unspecified 11/21/2000, 11/22/2003, 09/21/2004, 12/21/2004, 11/21/2005, 10/12/2008, 10/21/2009, 12/22/2010, 10/22/2011, 11/21/2012, 10/25/2013, 09/29/2015, 10/21/2016, 10/08/2017   Moderna Sars-Covid-2 Vaccination 05/22/2019, 06/20/2019, 11/22/2019   Pneumococcal Polysaccharide-23 05/21/2009   Pneumococcal-Unspecified 08/27/2001   Tdap 05/21/2008   Zoster, Live 12/28/2010    Past Medical History:  Diagnosis Date   Allergy    Arthritis    Asthma    as a child   Cancer ([place])  skin - neck  "pre- cancer"   COPD (chronic obstructive pulmonary disease) (HCC)    Depression    pt denies   Dyspnea    With exertion   GERD (gastroesophageal reflux disease)    tums if needed    Hyperlipidemia    Hypertension    Hypothyroidism    Inguinal hernia    Plantar fasciitis    Skin moles    Thyroid disease    Umbilical hernia    repaired    Tobacco History: Social History   Tobacco Use  Smoking Status Former   Years: 40.00   Types: Cigarettes   Quit date: 11/12/1995   Years since quitting: 25.1  Smokeless Tobacco Never   Counseling given: Not Answered   Outpatient Medications Prior to Visit  Medication Sig Dispense Refill   amLODipine (NORVASC) 5 MG tablet Take 5 mg by mouth daily.     dextromethorphan-guaiFENesin (MUCINEX DM) 30-600 MG 12hr tablet Take 1 tablet by mouth 2 (two) times daily. 20 tablet 0   diphenhydrAMINE (SOMINEX) 25 MG tablet Take 50 mg by mouth at bedtime.     hydrochlorothiazide (MICROZIDE) 12.5 MG capsule Take 12.5 mg by mouth daily.     levothyroxine (SYNTHROID, LEVOTHROID) 100 MCG tablet Take 100 mcg by mouth daily before breakfast.     Multiple Vitamin (MULTIVITAMIN) tablet Take 1 tablet by mouth daily.     Polyethyl Glycol-Propyl Glycol 0.4-0.3 % SOLN Place 1-2 drops into both eyes 3 (three) times daily as needed (dry eyes/irritated eyes).     Polyethylene Glycol 3350 (MIRALAX PO) Take 119 g by mouth once. colonoscopy     simvastatin (ZOCOR) 40 MG tablet Take 40 mg by mouth every evening.      TUMS 500 MG chewable tablet Chew 500 mg by mouth 2 (two) times daily as needed for indigestion or heartburn.     albuterol (VENTOLIN HFA) 108 (90 Base) MCG/ACT inhaler 2 puffs every 4 hours if you can't catch your breath 18 g 1   ipratropium-albuterol (DUONEB) 0.5-2.5 (3) MG/3ML SOLN Take 3 mLs by nebulization every 4 (four) hours as needed. 360 mL 0   Tiotropium Bromide-Olodaterol (STIOLTO RESPIMAT) 2.5-2.5 MCG/ACT AERS Inhale 2 puffs into the lungs daily. (Patient not taking: Reported on 12/29/2020) 1 each 11   aspirin 81 MG chewable tablet      Multiple Vitamin (MULTIVITAMIN WITH MINERALS) TABS tablet Take 1 tablet by mouth daily.      Tiotropium Bromide-Olodaterol (STIOLTO RESPIMAT) 2.5-2.5 MCG/ACT AERS Inhale 2 puffs into the lungs daily. 4 g 0   No facility-administered medications prior to visit.     Review of Systems:   Constitutional: No weight loss or gain, night sweats, fevers, chills. +fatigue HEENT: No headaches, difficulty swallowing, tooth/dental problems, or sore throat. No sneezing, itching, ear ache. +occasional nasal congestion, clear rhinorrhea; postnasal drip CV:  No chest pain, orthopnea, PND, swelling in lower extremities, anasarca, dizziness, palpitations, syncope Resp: +shortness of breath with exertion; productive cough with yellow sputum. No hemoptysis. No wheezing.  No chest wall deformity GI:  No heartburn, indigestion, abdominal pain, nausea, vomiting, diarrhea, change in bowel habits, loss of appetite, bloody stools.  GU: No dysuria, change in color of urine, urgency or frequency.  No flank pain, no hematuria  Skin: No rash, lesions, ulcerations MSK:  No joint pain or swelling.  No decreased range of motion.  No back pain. Neuro: +episode of lightheadedness and dizziness last night and this AM after using duoneb and albuterol  inhaler Psych: No depression or anxiety. Mood stable.     Physical Exam:  BP (!) 144/76 (BP Location: Left Arm, Cuff Size: Normal)   Pulse 66   Temp 98.1 F (36.7 C) (Oral)   Ht 5\' 9"  (1.753 m)   Wt 174 lb (78.9 kg)   SpO2 96% Comment: on RA  BMI 25.70 kg/m   GEN: Pleasant, interactive, well-nourished; in no acute distress. HEENT:  Normocephalic and atraumatic. EACs patent bilaterally. TM pearly gray with present light reflex bilaterally. PERRLA. Sclera white. Nasal turbinates pink, moist and patent bilaterally. No rhinorrhea present. Oropharynx pink and moist, without exudate or edema. No lesions, ulcerations, or postnasal drip.  NECK:  Supple w/ fair ROM. No JVD present. Normal carotid impulses w/o bruits. Thyroid symmetrical with no goiter or nodules palpated.  No lymphadenopathy.   CV: RRR, no m/r/g, no peripheral edema. Pulses intact, +2 bilaterally. No cyanosis, pallor or clubbing. PULMONARY:  Unlabored, regular breathing. Clear bilaterally A&P w/o wheezes/rales/rhonchi. No accessory muscle use. No dullness to percussion. GI: BS present and normoactive. Soft, non-tender to palpation. No organomegaly or masses detected. No CVA tenderness. MSK: No erythema, warmth or tenderness. Cap refil <2 sec all extrem. No deformities or joint swelling noted.  Neuro: A/Ox3. No focal deficits noted.   Skin: Warm, no lesions or rashe Psych: Normal affect and behavior. Judgement and thought content appropriate.     Lab Results:  CBC    Component Value Date/Time   WBC 6.9 09/07/2020 1428   RBC 4.06 (L) 09/07/2020 1428   HGB 11.0 (L) 09/07/2020 1428   HCT 33.8 (L) 09/07/2020 1428   PLT 402.0 (H) 09/07/2020 1428   MCV 83.5 09/07/2020 1428   MCH 29.9 10/13/2018 1142   MCHC 32.4 09/07/2020 1428   RDW 13.7 09/07/2020 1428   LYMPHSABS 1.1 09/07/2020 1428   MONOABS 0.6 09/07/2020 1428   EOSABS 0.3 09/07/2020 1428   BASOSABS 0.0 09/07/2020 1428    BMET    Component Value Date/Time   NA 139 12/29/2020 1526   K 3.8 12/29/2020 1526   CL 102 12/29/2020 1526   CO2 27 12/29/2020 1526   GLUCOSE 106 (H) 12/29/2020 1526   BUN 19 12/29/2020 1526   CREATININE 1.18 12/29/2020 1526   CALCIUM 10.4 12/29/2020 1526   GFRNONAA 52 (L) 10/13/2018 1142   GFRAA >60 10/13/2018 1142    BNP No results found for: BNP   Imaging:  DG Chest 2 View  Result Date: 12/20/2020 CLINICAL DATA:  Productive cough for several weeks. EXAM: CHEST - 2 VIEW COMPARISON:  10/18/2020 FINDINGS: The cardiomediastinal silhouette is unchanged with normal heart size. Aortic atherosclerosis is noted. The lungs are hyperinflated with bibasilar scarring. The subcentimeter nodular density projecting over the left lung base on the prior study is not well seen on the current examination. No acute  airspace consolidation, edema, pleural effusion, pneumothorax is identified. No acute osseous abnormality is seen. IMPRESSION: No active cardiopulmonary disease. Electronically Signed   By: Logan Bores M.D.   On: 12/20/2020 16:18      No flowsheet data found.  No results found for: NITRICOXIDE   Walking oximetry today SpO2 low 94% on room air. Completed walk without difficulties   Assessment & Plan:   Medication reaction Suspect his symptoms likely related to tachycardia/palpitations caused by albuterol as he has been doing duonebs twice daily and again experienced symptoms after his albuterol inhaler for the first time this morning. His symptoms have since subsided. EKG today showed  sinus arrhythmia with regular rate and normal p waves, no ST changes, and normal QTc. D dimer negative. Will change to xopenex rescue inhaler and nebs. If problem occurs again, will need presyncopal workup.   Patient Instructions  Stop taking ipatropium/albuterol nebulizers and albuterol inhaler.  -Xopenex inhaler 2 puffs or 3 mL neb every 6 hours as needed for shortness of breath or wheezing -Breztri 160 2 puffs Twice daily, brush tongue and rinse after -Prednisone taper pack. 4 tabs for 2 days, then 3 tabs for 2 days, 2 tabs for 2 days, then 1 tab for 2 days, then stop. Take in AM with breakfast.  -Protonix 40 mg daily  -Claritin (loratidine) 10 mg daily  -Flonase nasal spray 1-2 sprays each nostril daily   -Saline nasal spray 2-3 times a day as needed for nasal congestion/postnasal drip until symptoms improve -Mucinex 600 mg twice daily as needed until symptoms improve  Walking oximetry today SpO2 low 94% on room air  EKG with sinus arrhythmia but with a normal rate.   Pulmonary function testing scheduled today  Labs today including d dimer, BMET, and CBC with diff  Follow up in 2 weeks with Dr. Melvyn Novas, Roxan Diesel, NP or APP. If symptoms do not improve or worsen, please contact office for sooner  follow up or seek emergency care.     COPD  GOLD II Continues to experience high symptom burden. Eos 300. Step up to triple therapy. Breztri sample provided and rx sent. Will obtain repeat PFTs as last ones were in 2009. See above plan.  History of tobacco abuse 40 pack year history. CXR in July 2022 showed new nodular density left lung base; however, this was not visualized on repeat 12/20/2020. Referral to lung cancer screening program.   GERD without esophagitis Occasional fullness sensation and reflux symptoms. Takes Tums with some relief. Protonix 40 mg daily. Could be contributing to chronic cough. See above plan.  Allergic rhinitis with postnasal drip See above plan.   Clayton Bibles, NP 01/01/2021  Pt aware and understands NP's role.

## 2021-01-08 DIAGNOSIS — G4700 Insomnia, unspecified: Secondary | ICD-10-CM | POA: Diagnosis not present

## 2021-01-08 DIAGNOSIS — J449 Chronic obstructive pulmonary disease, unspecified: Secondary | ICD-10-CM | POA: Diagnosis not present

## 2021-01-08 DIAGNOSIS — F411 Generalized anxiety disorder: Secondary | ICD-10-CM | POA: Diagnosis not present

## 2021-01-10 DIAGNOSIS — G4700 Insomnia, unspecified: Secondary | ICD-10-CM | POA: Diagnosis not present

## 2021-01-11 NOTE — Progress Notes (Deleted)
@Patient  ID: Brandon Sanford, male    DOB: 11/20/1940, 80 y.o.   MRN: [id number]  No chief complaint on file.   Referring provider: Alroy Dust, L.Marlou Sa, MD  HPI: 80 year old male, former smoker (40 pack years) followed for COPD Gold 2 and bronchiectasis.  He is a patient of Dr. Gustavus Bryant and was last seen in office on 12/30/2018 by [facility] NP.  Past medical history significant for hypertension, recent pneumonia (June 2022), osteoarthritis, depression.  TEST/EVENTS:  06/03/2007 PFTs: FEV1 56%, ratio 40%, 27% improvement after bronchodilators and DLCO 87% 03/02/2007 CT chest: Bronchiectasis left lower lobe 07/22/2020 CXR 2 view: Right base airspace opacity compatible with pneumonia.  New nodular density left lung base. 12/20/2020 CXR 2 view: Arthrosclerosis.  The lungs are hyperinflated with bibasilar scarring.  Previous subcentimeter nodular density of the left lung base not well seen on current exam.  No acute airspace consolidation, edema, pleural effusion, pneumothorax. 09/07/2020 CBC: Hemoglobin 11, hematocrit 33.8, eos 300 09/07/2020 IgE: 65 12/20/2020 CXR 2 view: Atherosclerosis.  Lungs are hyperinflated with bibasilar scarring.  Subcentimeter nodular density projecting over the left lung base on the prior study is not well seen on the current exam.  No acute cardiopulmonary disease.  12/20/2020: ED for COPD exacerbation.  CXR negative.  Prednisone 40 mg x 5 days.  12/29/2020: OV with Tranell Wojtkiewicz NP.  Reported reaction to inhaler therapy.  Thought it was related to his Stiolto inhaler after he got a new inhaler from the [facility] as his previous ones were samples from [place].  He reported dizziness lightheadedness, tingling and feeling jittery.  He he had recently been doing DuoNeb treatments twice a day for 4 to 5 days and morning of the visit used albuterol inhaler for the first time which is when his symptoms worsen.  Suspected symptoms likely related to tachycardia/palpitations caused by albuterol.  EKG showed  sinus arrhythmia with regular rate and normal P waves, no ST changes and normal QTC.  D-dimer was negative changed to Xopenex rescue inhaler and nebs.  Persistent SOB, unchanged. Started Breztri 2 puffs twice a day.  Prednisone taper pack.  Started Protonix Claritin and Flonase as well as supportive care. PFTs scheduled. Referral to lung cancer screening program.   Allergies  Allergen Reactions   Lisinopril Cough   Chlorhexidine Rash   Pneumococcal Vaccines Rash    Immunization History  Administered Date(s) Administered   Fluad Quad(high Dose 65+) 10/18/2020   Influenza, High Dose Seasonal PF 09/26/2016, 10/02/2018   Influenza-Unspecified 11/21/2000, 11/22/2003, 09/21/2004, 12/21/2004, 11/21/2005, 10/12/2008, 10/21/2009, 12/22/2010, 10/22/2011, 11/21/2012, 10/25/2013, 09/29/2015, 10/21/2016, 10/08/2017   Moderna Sars-Covid-2 Vaccination 05/22/2019, 06/20/2019, 11/22/2019   Pneumococcal Polysaccharide-23 05/21/2009   Pneumococcal-Unspecified 08/27/2001   Tdap 05/21/2008   Zoster, Live 12/28/2010    Past Medical History:  Diagnosis Date   Allergy    Arthritis    Asthma    as a child   Cancer ([place])    skin - neck  "pre- cancer"   COPD (chronic obstructive pulmonary disease) ([place])    Depression    pt denies   Dyspnea    With exertion   GERD (gastroesophageal reflux disease)    tums if needed   Hyperlipidemia    Hypertension    Hypothyroidism    Inguinal hernia    Plantar fasciitis    Skin moles    Thyroid disease    Umbilical hernia    repaired    Tobacco History: Social History   Tobacco Use  Smoking Status Former  Years: 40.00   Types: Cigarettes   Quit date: 11/12/1995   Years since quitting: 25.1  Smokeless Tobacco Never   Counseling given: Not Answered   Outpatient Medications Prior to Visit  Medication Sig Dispense Refill   amLODipine (NORVASC) 5 MG tablet Take 5 mg by mouth daily.     Budeson-Glycopyrrol-Formoterol (BREZTRI AEROSPHERE) 160-9-4.8  MCG/ACT AERO Inhale 2 puffs into the lungs in the morning and at bedtime. 10.7 g 3   Budeson-Glycopyrrol-Formoterol (BREZTRI AEROSPHERE) 160-9-4.8 MCG/ACT AERO Inhale 2 puffs into the lungs in the morning and at bedtime. 5.9 g 0   dextromethorphan-guaiFENesin (MUCINEX DM) 30-600 MG 12hr tablet Take 1 tablet by mouth 2 (two) times daily. 20 tablet 0   diphenhydrAMINE (SOMINEX) 25 MG tablet Take 50 mg by mouth at bedtime.     fluticasone (FLONASE) 50 MCG/ACT nasal spray Place 1 spray into both nostrils daily. 18.2 mL 2   hydrochlorothiazide (MICROZIDE) 12.5 MG capsule Take 12.5 mg by mouth daily.     levalbuterol (XOPENEX HFA) 45 MCG/ACT inhaler Inhale 2 puffs into the lungs every 4 (four) hours as needed for wheezing. 1 each 2   levalbuterol (XOPENEX) 0.63 MG/3ML nebulizer solution Take 3 mLs (0.63 mg total) by nebulization every 4 (four) hours as needed for wheezing or shortness of breath. 3 mL 12   levothyroxine (SYNTHROID, LEVOTHROID) 100 MCG tablet Take 100 mcg by mouth daily before breakfast.     loratadine (CLARITIN) 10 MG tablet Take 1 tablet (10 mg total) by mouth daily. 30 tablet 2   Multiple Vitamin (MULTIVITAMIN) tablet Take 1 tablet by mouth daily.     pantoprazole (PROTONIX) 40 MG tablet Take 1 tablet (40 mg total) by mouth daily. 30 tablet 2   Polyethyl Glycol-Propyl Glycol 0.4-0.3 % SOLN Place 1-2 drops into both eyes 3 (three) times daily as needed (dry eyes/irritated eyes).     Polyethylene Glycol 3350 (MIRALAX PO) Take 119 g by mouth once. colonoscopy     predniSONE (DELTASONE) 10 MG tablet Take 4 tabs by mouth for 2 days, then 3 tabs for 2 days, 2 tabs for 2 days, then 1 tab for 2 days, then stop. Take in the morning with food. 20 tablet 0   simvastatin (ZOCOR) 40 MG tablet Take 40 mg by mouth every evening.      Tiotropium Bromide-Olodaterol (STIOLTO RESPIMAT) 2.5-2.5 MCG/ACT AERS Inhale 2 puffs into the lungs daily. (Patient not taking: Reported on 12/29/2020) 1 each 11   TUMS 500  MG chewable tablet Chew 500 mg by mouth 2 (two) times daily as needed for indigestion or heartburn.     No facility-administered medications prior to visit.     Review of Systems:   Constitutional: No weight loss or gain, night sweats, fevers, chills, fatigue, or lassitude. HEENT: No headaches, difficulty swallowing, tooth/dental problems, or sore throat. No sneezing, itching, ear ache, nasal congestion, or post nasal drip CV:  No chest pain, orthopnea, PND, swelling in lower extremities, anasarca, dizziness, palpitations, syncope Resp: No shortness of breath with exertion or at rest. No excess mucus or change in color of mucus. No productive or non-productive. No hemoptysis. No wheezing.  No chest wall deformity GI:  No heartburn, indigestion, abdominal pain, nausea, vomiting, diarrhea, change in bowel habits, loss of appetite, bloody stools.  GU: No dysuria, change in color of urine, urgency or frequency.  No flank pain, no hematuria  Skin: No rash, lesions, ulcerations MSK:  No joint pain or swelling.  No decreased range  of motion.  No back pain. Neuro: No dizziness or lightheadedness.  Psych: No depression or anxiety. Mood stable.     Physical Exam:  There were no vitals taken for this visit.  GEN: Pleasant, interactive, well-nourished/chronically-ill appearing/acutely-ill appearing/poorly-nourished/morbidly obese; in no acute distress.****** HEENT:  Normocephalic and atraumatic. EACs patent bilaterally. TM pearly gray with present light reflex bilaterally. PERRLA. Sclera white. Nasal turbinates pink, moist and patent bilaterally. No rhinorrhea present. Oropharynx pink and moist, without exudate or edema. No lesions, ulcerations, or postnasal drip.  NECK:  Supple w/ fair ROM. No JVD present. Normal carotid impulses w/o bruits. Thyroid symmetrical with no goiter or nodules palpated. No lymphadenopathy.   CV: RRR, no m/r/g, no peripheral edema. Pulses intact, +2 bilaterally. No  cyanosis, pallor or clubbing. PULMONARY:  Unlabored, regular breathing. Clear bilaterally A&P w/o wheezes/rales/rhonchi. No accessory muscle use. No dullness to percussion. GI: BS present and normoactive. Soft, non-tender to palpation. No organomegaly or masses detected. No CVA tenderness. MSK: No erythema, warmth or tenderness. Cap refil <2 sec all extrem. No deformities or joint swelling noted.  Neuro: A/Ox3. No focal deficits noted.   Skin: Warm, no lesions or rashe Psych: Normal affect and behavior. Judgement and thought content appropriate.     Lab Results:  CBC    Component Value Date/Time   WBC 6.9 09/07/2020 1428   RBC 4.06 (L) 09/07/2020 1428   HGB 11.0 (L) 09/07/2020 1428   HCT 33.8 (L) 09/07/2020 1428   PLT 402.0 (H) 09/07/2020 1428   MCV 83.5 09/07/2020 1428   MCH 29.9 10/13/2018 1142   MCHC 32.4 09/07/2020 1428   RDW 13.7 09/07/2020 1428   LYMPHSABS 1.1 09/07/2020 1428   MONOABS 0.6 09/07/2020 1428   EOSABS 0.3 09/07/2020 1428   BASOSABS 0.0 09/07/2020 1428    BMET    Component Value Date/Time   NA 139 12/29/2020 1526   K 3.8 12/29/2020 1526   CL 102 12/29/2020 1526   CO2 27 12/29/2020 1526   GLUCOSE 106 (H) 12/29/2020 1526   BUN 19 12/29/2020 1526   CREATININE 1.18 12/29/2020 1526   CALCIUM 10.4 12/29/2020 1526   GFRNONAA 52 (L) 10/13/2018 1142   GFRAA >60 10/13/2018 1142    BNP No results found for: BNP   Imaging:  DG Chest 2 View  Result Date: 12/20/2020 CLINICAL DATA:  Productive cough for several weeks. EXAM: CHEST - 2 VIEW COMPARISON:  10/18/2020 FINDINGS: The cardiomediastinal silhouette is unchanged with normal heart size. Aortic atherosclerosis is noted. The lungs are hyperinflated with bibasilar scarring. The subcentimeter nodular density projecting over the left lung base on the prior study is not well seen on the current examination. No acute airspace consolidation, edema, pleural effusion, pneumothorax is identified. No acute osseous  abnormality is seen. IMPRESSION: No active cardiopulmonary disease. Electronically Signed   By: Logan Bores M.D.   On: 12/20/2020 16:18      No flowsheet data found.  No results found for: NITRICOXIDE      Assessment & Plan:   No problem-specific Assessment & Plan notes found for this encounter.     Clayton Bibles, NP 01/11/2021  Pt aware and understands NP's role.

## 2021-01-12 ENCOUNTER — Ambulatory Visit: Payer: Medicare HMO | Admitting: Nurse Practitioner

## 2021-01-12 ENCOUNTER — Other Ambulatory Visit: Payer: Self-pay | Admitting: Nurse Practitioner

## 2021-01-12 ENCOUNTER — Telehealth: Payer: Self-pay | Admitting: Nurse Practitioner

## 2021-01-12 MED ORDER — BREZTRI AEROSPHERE 160-9-4.8 MCG/ACT IN AERO: 2.0000 | INHALATION_SPRAY | Freq: Two times a day (BID) | RESPIRATORY_TRACT | 0 refills | Status: DC

## 2021-01-12 NOTE — Telephone Encounter (Signed)
Spoke with pt who stated [facility] Breztri [facility] but is working on getting some for pt use. Pt instructed to come to office on Tuesday or Wednesday next week to pick up samples. Pt stated understanding and samples placed up front for pt pick up.

## 2021-01-12 NOTE — Telephone Encounter (Signed)
Spoke

## 2021-01-12 NOTE — Telephone Encounter (Signed)
Spoke with pt who states he would like to know which inhaler (Stiolt vs Judithann Sauger) Dani Gobble would like him to cont with. Dani Gobble please advise. Thank you

## 2021-01-22 NOTE — Progress Notes (Signed)
Brandon Sanford, male    DOB: 1940-06-08,   MRN: [id number]   Brief patient profile:  26 yowm quit smoking in 1997 with GOLD 2 COPD 2008  self referred to pulmonary clinic 09/07/2020 for cough / abn cxr from 07/22/20 when rx as CAP with levaquin 750 x 7 days  Seen 2008   COPD      -PFTs 06/03/07  FEV1 56% ratio 40% 27% improvement after bronchodilators and DLCO 87%    Bronchiectasis by CT 03/02/07 [facility]     History of Present Illness  09/07/2020  Pulmonary/ 1st office eval/Ashni Lonzo  GOLD ? Still II COPD/bronchiectasis  maint on advair 500   Chief Complaint  Patient presents with   Consult    PNA 07/19/20, more SOB and dry cough since. Using Advair, feels like it helps  Baseline doe x fast walk/ incline on generic  advair 500  Dyspnea:  slow walk maybe a block /no desats  Cough: 24/7 dry hack  Sleep: on  side flat bed  SABA use: duoneb a week prior to OV   Had pleuritic cp R ant/lat resolved now/ no fever  Rec Stop generic advair = xiella 500  Start stiolto one puff each am - if breathing getting worse and you need to use the backup inhaler (albuterol) or nebulizer (albuterol/ipratropium) then increase stiolto to 2 puffs each am  Try prilosec otc 8m  Take 30-60 min before first meal of the day and Pepcid ac (famotidine)  otc 20 mg one @  bedtime until cough is completely gone for at least a week without the need for cough suppression For cough > ok to try delsym otc 2 tsp every 12 hours and add tessalon 200 mg three times daily if still coughing  Please schedule a follow up office visit in 4 weeks, sooner if needed  >>> late add :   f/u final cxr planned / recheck esr at 4 weeks     10/18/2020  f/u ov/Quoc Tome re: GOLD ? Still II COPD/bronchiectasis maint on stiolto 1 puff each am   Chief Complaint  Patient presents with   Follow-up   Dyspnea:  walks neighborhood x 15 min  = MMRC1 = can walk nl pace, flat grade, can't hurry or go uphills or steps s sob   Cough: min rattling daytime / better  sleeping s cough waking  > no mucus / stopped gerd rx prior to cough recurrence sev week prior to OV   Sleeping: bed is flat, sleeps on side/ one pillow SABA use: none  02: none  Covid status:   vax x 3 total  Rec For cough > mucinex dm up to 1200 mg every 12 hours as needed Try prilosec otc 239m Take 30-60 min before first meal of the day and Pepcid ac (famotidine) 20 mg one @  bedtime until cough is completely gone for at least a week without the need for cough suppression - if get worse restart I recommend the new omicron vaccine  If not doing better remember stiolto 2 puffs each am  Please schedule a follow up visit in 6 months but call sooner if needed     Phone call 12/28/20 Spoke with pt who states he spoke with on call doc last night r/t not being able to sleep and tingling feeling. Pt states he was not able to sleep at all last night. Pt states he has only been taking 1 puff in the morning of his Stiolto  and is having these issues.   NP recs  12/29/20  Stop taking ipatropium/albuterol nebulizers and albuterol inhaler. -Xopenex inhaler 2 puffs or 3 mL neb every 6 hours as needed for shortness of breath or wheezing -Breztri 160 2 puffs Twice daily, brush tongue and rinse after -Prednisone taper pack. 4 tabs for 2 days, then 3 tabs for 2 days, 2 tabs for 2 days, then 1 tab for 2 days, then stop. Take in AM with breakfast.  -Protonix 40 mg daily  -Claritin (loratidine) 10 mg daily  -Flonase nasal spray 1-2 sprays each nostril daily  -Saline nasal spray 2-3 times a day as needed for nasal congestion/postnasal drip until symptoms improve -Mucinex 600 mg twice daily as needed until symptoms improve Walking oximetry today SpO2 low 94% on room air EKG with sinus arrhythmia but with a normal rate.  Pulmonary function testing scheduled    01/23/2021  f/u ov/Wert re: ? GOLD 2 copd/   maint on breztri 2bid   Chief Complaint  Patient presents with   Follow-up    Breathing is unchanged. He  has not used his rescue inhaler or neb.    Dyspnea:  still walking neighborhood x 15 min and gym treadmill not really limted by breathing at slow  pace Cough: none  Sleeping: flat bed / one pillow  SABA use: none  02: none  Covid status:   vax x 3  Overt hb rx otc's prn    No obvious day to day or daytime variability or assoc excess/ purulent sputum or mucus plugs or hemoptysis or cp or chest tightness, subjective wheeze or overt sinus symptoms.   Sleeping  without nocturnal  or early am exacerbation  of respiratory  c/o's or need for noct saba. Also denies any obvious fluctuation of symptoms with weather or environmental changes or other aggravating or alleviating factors except as outlined above   No unusual exposure hx or h/o childhood pna/ asthma or knowledge of premature birth.  Current Allergies, Complete Past Medical History, Past Surgical History, Family History, and Social History were reviewed in [employer] record.  ROS  The following are not active complaints unless bolded Hoarseness, sore throat, dysphagia, dental problems, itching, sneezing,  nasal congestion or discharge of excess mucus or purulent secretions, ear ache,   fever, chills, sweats, unintended wt loss or wt gain, classically pleuritic or exertional cp,  orthopnea pnd or arm/hand swelling  or leg swelling, presyncope, palpitations, abdominal pain, anorexia, nausea, vomiting, diarrhea  or change in bowel habits or change in bladder habits, change in stools or change in urine, dysuria, hematuria,  rash, arthralgias, visual complaints, headache, numbness, weakness or ataxia or problems with walking or coordination,  change in mood or  memory.        Current Meds  Medication Sig   amLODipine (NORVASC) 5 MG tablet Take 5 mg by mouth daily.   Budeson-Glycopyrrol-Formoterol (BREZTRI AEROSPHERE) 160-9-4.8 MCG/ACT AERO Inhale 2 puffs into the lungs in the morning and at bedtime.    dextromethorphan-guaiFENesin (MUCINEX DM) 30-600 MG 12hr tablet Take 1 tablet by mouth 2 (two) times daily.   diphenhydrAMINE (SOMINEX) 25 MG tablet Take 50 mg by mouth at bedtime.   fluticasone (FLONASE) 50 MCG/ACT nasal spray Place 1 spray into both nostrils daily.   hydrochlorothiazide (MICROZIDE) 12.5 MG capsule Take 12.5 mg by mouth daily.   levalbuterol (XOPENEX HFA) 45 MCG/ACT inhaler Inhale 2 puffs into the lungs every 4 (four) hours as needed for wheezing.  levalbuterol (XOPENEX) 0.63 MG/3ML nebulizer solution Take 3 mLs (0.63 mg total) by nebulization every 4 (four) hours as needed for wheezing or shortness of breath.   levothyroxine (SYNTHROID, LEVOTHROID) 100 MCG tablet Take 100 mcg by mouth daily before breakfast.   Multiple Vitamin (MULTIVITAMIN) tablet Take 1 tablet by mouth daily.   Polyethyl Glycol-Propyl Glycol 0.4-0.3 % SOLN Place 1-2 drops into both eyes 3 (three) times daily as needed (dry eyes/irritated eyes).   simvastatin (ZOCOR) 40 MG tablet Take 40 mg by mouth every evening.    TUMS 500 MG chewable tablet Chew 500 mg by mouth 2 (two) times daily as needed for indigestion or heartburn.             Past Medical History:  Diagnosis Date   Allergy    Arthritis    Asthma    as a child   Cancer ([place])    skin - neck  "pre- cancer"   COPD (chronic obstructive pulmonary disease) ([place])    Depression    pt denies   Dyspnea    With exertion   GERD (gastroesophageal reflux disease)    tums if needed   Hyperlipidemia    Hypertension    Hypothyroidism    Inguinal hernia    Plantar fasciitis    Skin moles    Thyroid disease    Umbilical hernia    repaired         Objective:       01/23/2021         174   10/18/20 175 lb 6.4 oz (79.6 kg)  09/07/20 169 lb 8 oz (76.9 kg)  03/23/20 178 lb (80.7 kg)      Vital signs reviewed  01/23/2021  - Note at rest 02 sats  96% on RA   General appearance:    amb wm nad       HEENT : pt wearing mask not removed for  exam due to covid - 19 concerns.    NECK :  without JVD/Nodes/TM/ nl carotid upstrokes bilaterally   LUNGS: no acc muscle use,  Mild barrel  contour chest wall with bilateral  Distant bs s audible wheeze and  without cough on insp or exp maneuvers  and mild  Hyperresonant  to  percussion bilaterally     CV:  RRR  no s3 or murmur or increase in P2, and no edema   ABD:  soft and nontender with pos end  insp Hoover's  in the supine position. No bruits or organomegaly appreciated, bowel sounds nl  MS:   Nl gait/  ext warm without deformities, calf tenderness, cyanosis or clubbing No obvious joint restrictions   SKIN: warm and dry without lesions    NEURO:  alert, approp, nl sensorium with  no motor or cerebellar deficits apparent.          I personally reviewed images and agree with radiology impression as follows:  CXR:   pa and lateral 12/20/20 No active cardiopulmonary disease.    Assessment

## 2021-01-23 ENCOUNTER — Encounter: Payer: Self-pay | Admitting: Internal Medicine

## 2021-01-23 ENCOUNTER — Other Ambulatory Visit: Payer: Self-pay

## 2021-01-23 ENCOUNTER — Ambulatory Visit (INDEPENDENT_AMBULATORY_CARE_PROVIDER_SITE_OTHER): Payer: Medicare HMO | Admitting: Internal Medicine

## 2021-01-23 DIAGNOSIS — J189 Pneumonia, unspecified organism: Secondary | ICD-10-CM | POA: Diagnosis not present

## 2021-01-23 DIAGNOSIS — R911 Solitary pulmonary nodule: Secondary | ICD-10-CM

## 2021-01-23 DIAGNOSIS — J449 Chronic obstructive pulmonary disease, unspecified: Secondary | ICD-10-CM | POA: Diagnosis not present

## 2021-01-23 MED ORDER — STIOLTO RESPIMAT 2.5-2.5 MCG/ACT IN AERS: 2.0000 | INHALATION_SPRAY | Freq: Every day | RESPIRATORY_TRACT | 11 refills | Status: DC

## 2021-01-23 NOTE — Patient Instructions (Addendum)
Plan A = Automatic = Always=   change breztri after PFTs to  Stiolto 1 puff each am - if not doing as well ok to use 2 each am   Plan B = Backup (to supplement plan A, not to replace it) Only use your levoalbuterol inhaler as a rescue medication to be used if you can't catch your breath by resting or doing a relaxed purse lip breathing pattern.  - The less you use it, the better it will work when you need it. - Ok to use the inhaler up to 2 puffs  every 4 hours if you must but call for appointment if use goes up over your usual need - Don't leave home without it !!  (think of it like the spare tire for your car)   Plan C = Crisis (instead of Plan B but only if Plan B stops working) - only use your levoalbuterol nebulizer if you first try Plan B and it fails to help > ok to use the nebulizer up to every 4 hours but if start needing it regularly call for immediate appointment  Try prilosec otc 20mg   Take 30-60 min before first meal of the day and Pepcid ac (famotidine) 20 mg one @  bedtime until cough is completely gone for at least a week without the need for cough suppression     Please schedule a follow up visit in 6  months but call sooner if needed

## 2021-01-23 NOTE — Assessment & Plan Note (Signed)
Quit smoking 1997 - see cxr 10/18/2020 [facility] SPN 7 mm not seen on cxr 06/17/20 or ct  05/07/17   cxr reviewed and back to baseline p recent pna and out 15 y from smoking so relatively low risk > no directed f/u planned   Discussed in detail all the  indications, usual  risks and alternatives  relative to the benefits with patient who agrees to proceed with conservative f/u as outlined           Each maintenance medication was reviewed in detail including emphasizing most importantly the difference between maintenance and prns and under what circumstances the prns are to be triggered using an action plan format where appropriate.  Total time for H and P, chart review, counseling, reviewing hfa/neb device(s) and generating customized AVS unique to this office visit / same day charting = 25 min

## 2021-01-23 NOTE — Assessment & Plan Note (Signed)
Quit smoking 1997  -  Bronchiectasis by CT 03/02/07 [facility] -   PFTs 06/03/07  FEV1 56% ratio 40% 27% improvement after bronchodilators and DLCO 87% -  Allergy profile 09/07/20 >  Eos 0.3 /  IgE  65 - 01/23/2021  After extensive coaching inhaler device,  effectiveness =    90% continue breztri   Group D in terms of symptom/risk and laba/lama/ICS  therefore appropriate rx at this point >>>  breaztri and approp saba  Re SABA :  I spent extra time with pt today reviewing appropriate use of albuterol for prn use on exertion with the following points: 1) saba is for relief of sob that does not improve by walking a slower pace or resting but rather if the pt does not improve after trying this first. 2) If the pt is convinced, as many are, that saba helps recover from activity faster then it's easy to tell if this is the case by re-challenging : ie stop, take the inhaler, then p 5 minutes try the exact same activity (intensity of workload) that just caused the symptoms and see if they are substantially diminished or not after saba 3) if there is an activity that reproducibly causes the symptoms, try the saba 15 min before the activity on alternate days   If in fact the saba really does help, then fine to continue to use it prn but advised may need to look closer at the maintenance regimen being used to achieve better control of airways disease with exertion.

## 2021-01-24 ENCOUNTER — Ambulatory Visit (INDEPENDENT_AMBULATORY_CARE_PROVIDER_SITE_OTHER): Payer: Medicare HMO | Admitting: Internal Medicine

## 2021-01-24 DIAGNOSIS — J449 Chronic obstructive pulmonary disease, unspecified: Secondary | ICD-10-CM

## 2021-01-24 LAB — PULMONARY FUNCTION TEST
DL/VA % pred: 72 %
DL/VA: 2.86 ml/min/mmHg/L
DLCO cor % pred: 72 %
DLCO cor: 16.64 ml/min/mmHg
DLCO unc % pred: 72 %
DLCO unc: 16.64 ml/min/mmHg
FEF 25-75 Post: 0.94 L/sec
FEF 25-75 Pre: 0.68 L/sec
FEF2575-%Change-Post: 37 %
FEF2575-%Pred-Post: 50 %
FEF2575-%Pred-Pre: 37 %
FEV1-%Change-Post: 15 %
FEV1-%Pred-Post: 67 %
FEV1-%Pred-Pre: 58 %
FEV1-Post: 1.79 L
FEV1-Pre: 1.55 L
FEV1FVC-%Change-Post: 5 %
FEV1FVC-%Pred-Pre: 63 %
FEV6-%Change-Post: 9 %
FEV6-%Pred-Post: 102 %
FEV6-%Pred-Pre: 93 %
FEV6-Post: 3.55 L
FEV6-Pre: 3.25 L
FEV6FVC-%Change-Post: 0 %
FEV6FVC-%Pred-Post: 102 %
FEV6FVC-%Pred-Pre: 103 %
FVC-%Change-Post: 10 %
FVC-%Pred-Post: 99 %
FVC-%Pred-Pre: 90 %
FVC-Post: 3.72 L
FVC-Pre: 3.38 L
Post FEV1/FVC ratio: 48 %
Post FEV6/FVC ratio: 96 %
Pre FEV1/FVC ratio: 46 %
Pre FEV6/FVC Ratio: 97 %
RV % pred: 213 %
RV: 5.39 L
TLC % pred: 135 %
TLC: 9 L

## 2021-01-24 MED ORDER — ALBUTEROL SULFATE HFA 108 (90 BASE) MCG/ACT IN AERS: 2.0000 | INHALATION_SPRAY | Freq: Four times a day (QID) | RESPIRATORY_TRACT | 6 refills | Status: DC | PRN

## 2021-01-24 NOTE — Progress Notes (Signed)
PFT done today. 

## 2021-01-24 NOTE — Progress Notes (Signed)
The patient was in the office for a PFT and I have let him know that I would send in a refill of the Albuterol to [facility]. Nothing further needed.

## 2021-01-24 NOTE — Addendum Note (Signed)
Addended by: Dessie Coma on: 01/24/2021 04:54 PM   Modules accepted: Orders

## 2021-02-26 ENCOUNTER — Telehealth: Payer: Self-pay | Admitting: Acute Care

## 2021-02-26 NOTE — Telephone Encounter (Signed)
Patient will turn 23 on 03/06/21 and will be ineligible to have LCS CT.  You may still order a Chest CT wo contrast under your service as a provider but he is not eligible under LCS criteria.  Sorry we could not assist the patient.  Thanks

## 2021-02-26 NOTE — Telephone Encounter (Signed)
Thanks for the FYI

## 2021-02-27 DIAGNOSIS — E7800 Pure hypercholesterolemia, unspecified: Secondary | ICD-10-CM | POA: Diagnosis not present

## 2021-02-27 DIAGNOSIS — J449 Chronic obstructive pulmonary disease, unspecified: Secondary | ICD-10-CM | POA: Diagnosis not present

## 2021-02-27 DIAGNOSIS — E039 Hypothyroidism, unspecified: Secondary | ICD-10-CM | POA: Diagnosis not present

## 2021-02-27 DIAGNOSIS — I10 Essential (primary) hypertension: Secondary | ICD-10-CM | POA: Diagnosis not present

## 2021-02-27 DIAGNOSIS — G4700 Insomnia, unspecified: Secondary | ICD-10-CM | POA: Diagnosis not present

## 2021-04-17 ENCOUNTER — Ambulatory Visit: Payer: Medicare HMO | Admitting: Internal Medicine

## 2021-06-12 ENCOUNTER — Encounter: Payer: Self-pay | Admitting: Internal Medicine

## 2021-06-12 ENCOUNTER — Ambulatory Visit: Payer: Medicare HMO | Admitting: Internal Medicine

## 2021-06-12 DIAGNOSIS — J449 Chronic obstructive pulmonary disease, unspecified: Secondary | ICD-10-CM

## 2021-06-12 MED ORDER — PANTOPRAZOLE SODIUM 40 MG PO TBEC: 40.0000 mg | DELAYED_RELEASE_TABLET | Freq: Every day | ORAL | 2 refills | Status: DC

## 2021-06-12 MED ORDER — PREDNISONE 10 MG PO TABS: ORAL_TABLET | ORAL | 0 refills | Status: DC

## 2021-06-12 MED ORDER — FAMOTIDINE 20 MG PO TABS: ORAL_TABLET | ORAL | 11 refills | Status: DC

## 2021-06-12 NOTE — Patient Instructions (Addendum)
Plan A = Automatic = Always=    Stiolto 2 puffs daily   Plan B = Backup (to supplement plan A, not to replace it) Only use your levoalbuterol inhaler as a rescue medication to be used if you can't catch your breath by resting or doing a relaxed purse lip breathing pattern.  - The less you use it, the better it will work when you need it. - Ok to use the inhaler up to 2 puffs  every 4 hours if you must but call for appointment if use goes up over your usual need - Don't leave home without it !!  (think of it like the spare tire for your car)   Plan C = Crisis (instead of Plan B but only if Plan B stops working) - only use your levoalbuterol nebulizer if you first try Plan B and it fails to help > ok to use the nebulizer up to every 4 hours but if start needing it regularly call for immediate appointment  Pantoprazole (protonix) 40 mg   Take  30-60 min before first meal of the day and Pepcid (famotidine)  20 mg after supper until return to office - this is the best way to tell whether stomach acid is contributing to your problem.    GERD (REFLUX)  is an extremely common cause of respiratory symptoms just like yours , many times with no obvious heartburn at all.    It can be treated with medication, but also with lifestyle changes including elevation of the head of your bed (ideally with 6 -8inch blocks under the headboard of your bed),  Smoking cessation, avoidance of late meals, excessive alcohol, and avoid fatty foods, chocolate, peppermint, colas, red wine, and acidic juices such as orange juice.  NO MINT OR MENTHOL PRODUCTS SO NO COUGH DROPS  USE SUGARLESS CANDY INSTEAD (Jolley ranchers or Stover's or Life Savers) or even ice chips will also do - the key is to swallow to prevent all throat clearing. NO OIL BASED VITAMINS - use powdered substitutes.  Avoid fish oil when coughing.   Prednisone 10 mg take  4 each am x 2 days,   2 each am x 2 days,  1 each am x 2 days and stop   Leep July appt -  call sooner if needed  Late add needs to go back to breztri once finished prednisone rx

## 2021-06-12 NOTE — Progress Notes (Unsigned)
Brandon Sanford, male    DOB: October 22, 1940,   MRN: [id number]   Brief patient profile:  81yowm quit smoking in 1997 with GOLD 2 COPD 2008  self referred to pulmonary clinic 09/07/2020 for cough / abn cxr from 07/22/20 when rx as CAP with levaquin 750 x 7 days  Seen 2008   COPD      -PFTs 06/03/07  FEV1 56% ratio 40% 27% improvement after bronchodilators and DLCO 87%    Bronchiectasis by CT 03/02/07 [facility]     History of Present Illness  09/07/2020  Pulmonary/ 1st office eval/Brandon Sanford  GOLD ? Still II COPD/bronchiectasis  maint on advair 500   Chief Complaint  Patient presents with   Consult    PNA 07/19/20, more SOB and dry cough since. Using Advair, feels like it helps  Baseline doe x fast walk/ incline on generic  advair 500  Dyspnea:  slow walk maybe a block /no desats  Cough: 24/7 dry hack  Sleep: on  side flat bed  SABA use: duoneb a week prior to OV   Had pleuritic cp R ant/lat resolved now/ no fever  Rec Stop generic advair = xiella 500  Start stiolto one puff each am - if breathing getting worse and you need to use the backup inhaler (albuterol) or nebulizer (albuterol/ipratropium) then increase stiolto to 2 puffs each am  Try prilosec otc 63m  Take 30-60 min before first meal of the day and Pepcid ac (famotidine)  otc 20 mg one @  bedtime until cough is completely gone for at least a week without the need for cough suppression For cough > ok to try delsym otc 2 tsp every 12 hours and add tessalon 200 mg three times daily if still coughing  Please schedule a follow up office visit in 4 weeks, sooner if needed  >>> late add :   f/u final cxr planned / recheck esr at 4 weeks     10/18/2020  f/u ov/Brandon Sanford re: GOLD ? Still II COPD/bronchiectasis maint on stiolto 1 puff each am   Chief Complaint  Patient presents with   Follow-up   Dyspnea:  walks neighborhood x 15 min  = MMRC1 = can walk nl pace, flat grade, can't hurry or go uphills or steps s sob   Cough: min rattling daytime / better  sleeping s cough waking  > no mucus / stopped gerd rx prior to cough recurrence sev week prior to OV   Sleeping: bed is flat, sleeps on side/ one pillow SABA use: none  02: none  Covid status:   vax x 3 total  Rec For cough > mucinex dm up to 1200 mg every 12 hours as needed Try prilosec otc 246m Take 30-60 min before first meal of the day and Pepcid ac (famotidine) 20 mg one @  bedtime until cough is completely gone for at least a week without the need for cough suppression - if get worse restart I recommend the new omicron vaccine  If not doing better remember stiolto 2 puffs each am  Please schedule a follow up visit in 6 months but call sooner if needed     Phone call 12/28/20 Spoke with pt who states he spoke with on call doc last night r/t not being able to sleep and tingling feeling. Pt states he was not able to sleep at all last night. Pt states he has only been taking 1 puff in the morning of his Stiolto and is  having these issues.   NP recs  12/29/20  Stop taking ipatropium/albuterol nebulizers and albuterol inhaler. -Xopenex inhaler 2 puffs or 3 mL neb every 6 hours as needed for shortness of breath or wheezing -Breztri 160 2 puffs Twice daily, brush tongue and rinse after -Prednisone taper pack. 4 tabs for 2 days, then 3 tabs for 2 days, 2 tabs for 2 days, then 1 tab for 2 days, then stop. Take in AM with breakfast.  -Protonix 40 mg daily  -Claritin (loratidine) 10 mg daily  -Flonase nasal spray 1-2 sprays each nostril daily  -Saline nasal spray 2-3 times a day as needed for nasal congestion/postnasal drip until symptoms improve -Mucinex 600 mg twice daily as needed until symptoms improve Walking oximetry today SpO2 low 94% on room air EKG with sinus arrhythmia but with a normal rate.  Pulmonary function testing scheduled    01/23/2021  f/u ov/Brandon Sanford re: ? GOLD 2 copd/   maint on breztri 2bid   Chief Complaint  Patient presents with   Follow-up    Breathing is unchanged. He  has not used his rescue inhaler or neb.    Dyspnea:  still walking neighborhood x 15 min and gym treadmill not really limted by breathing at slow  pace Cough: none  Sleeping: flat bed / one pillow  SABA use: none  02: none  Covid status:   vax x 3  Overt hb rx otc's prn  Rec Plan A = Automatic = Always=   change breztri after PFTs to  Stiolto 1 puff each am - if not doing as well ok to use 2 each am  Plan B = Backup (to supplement plan A, not to replace it) Only use your levoalbuterol inhaler as a rescue medication  Plan C = Crisis (instead of Plan B but only if Plan B stops working) - only use your levoalbuterol nebulizer if you first try Plan B  Try prilosec otc 45m  Take 30-60 min before first meal of the day and Pepcid ac (famotidine) 20 mg one @  bedtime until cough is completely gone for at least a week        06/12/2021  f/u ov/Brandon Sanford re: GOLD 2    maint on stiolto  much worse since cough / better with neb  No chief complaint on file.  Dyspnea:  was better on one stiolto  Cough: min mucoid  Sleeping: poorly since cough SABA use: just started 02: none      No obvious day to day or daytime variability or assoc excess/ purulent sputum or mucus plugs or hemoptysis or cp or chest tightness, subjective wheeze or overt sinus or hb symptoms.   *** without nocturnal  or early am exacerbation  of respiratory  c/o's or need for noct saba. Also denies any obvious fluctuation of symptoms with weather or environmental changes or other aggravating or alleviating factors except as outlined above   No unusual exposure hx or h/o childhood pna/ asthma or knowledge of premature birth.  Current Allergies, Complete Past Medical History, Past Surgical History, Family History, and Social History were [facility] [employer] [facility].  ROS  The following are not active complaints unless bolded Hoarseness, sore throat, dysphagia, dental problems, itching, sneezing,  nasal  congestion or discharge of excess mucus or purulent secretions, ear ache,   fever, chills, sweats, unintended wt loss or wt gain, classically pleuritic or exertional cp,  orthopnea pnd or arm/hand swelling  or leg swelling, presyncope,  palpitations, abdominal pain, anorexia, nausea, vomiting, diarrhea  or change in bowel habits or change in bladder habits, change in stools or change in urine, dysuria, hematuria,  rash, arthralgias, visual complaints, headache, numbness, weakness or ataxia or problems with walking or coordination,  change in mood or  memory.        No outpatient medications have been marked as taking for the 06/12/21 encounter (Appointment) with Tanda Rockers, MD.                Past Medical History:  Diagnosis Date   Allergy    Arthritis    Asthma    as a child   Cancer ([place])    skin - neck  "pre- cancer"   COPD (chronic obstructive pulmonary disease) ([place])    Depression    pt denies   Dyspnea    With exertion   GERD (gastroesophageal reflux disease)    tums if needed   Hyperlipidemia    Hypertension    Hypothyroidism    Inguinal hernia    Plantar fasciitis    Skin moles    Thyroid disease    Umbilical hernia    repaired         Objective:      06/12/2021       ***  01/23/2021         174   10/18/20 175 lb 6.4 oz (79.6 kg)  09/07/20 169 lb 8 oz (76.9 kg)  03/23/20 178 lb (80.7 kg)      Vital signs reviewed  06/12/2021  - Note at rest 02 sats  ***% on ***   General appearance:    ***       Mild bar ***          Assessment

## 2021-06-13 ENCOUNTER — Encounter: Payer: Self-pay | Admitting: Internal Medicine

## 2021-06-13 ENCOUNTER — Telehealth: Payer: Self-pay | Admitting: Internal Medicine

## 2021-06-13 MED ORDER — BREZTRI AEROSPHERE 160-9-4.8 MCG/ACT IN AERO: 2.0000 | INHALATION_SPRAY | Freq: Two times a day (BID) | RESPIRATORY_TRACT | 11 refills | Status: DC

## 2021-06-13 NOTE — Telephone Encounter (Signed)
Late add needs to go back to breztri once finished prednisone rx   Dose is Take 2 puffs first thing in am and then another 2 puffs about 12 hours later.   And stop stiolto when starts breztri

## 2021-06-13 NOTE — Telephone Encounter (Signed)
Spoke with the pt and notified of recommendations from [facility]. He verbalized understanding. Rx for Judithann Sauger was sent to pharm. Nothing further needed.

## 2021-06-13 NOTE — Assessment & Plan Note (Signed)
Quit smoking 1997  -  Bronchiectasis by CT 03/02/07 LLL -   PFTs 06/03/07  FEV1 56% ratio 40% 27% improvement after bronchodilators and DLCO 87% -  Allergy profile 09/07/20 >  Eos 0.3 /  IgE  65 - 01/23/2021  After extensive coaching inhaler device,  effectiveness =    90% continue breztri  - PFT's  01/24/21   FEV1 1.79 (67 % ) ratio 0.48  p 15%improvement from saba p ? breztri prior to study with DLCO  16.64 (72%)  FV curve classic concavity    Group D (now reclassified as E) in terms of symptom/risk and laba/lama/ICS  therefore appropriate rx at this point >>>  Needs to be back on breztri once we get the upper airway symptoms under control with max gerd rx and stiolto/ pred in place of breztri in the short run.  In the meantime needs to understand / use the ABC plan better see avs for instructions unique to this ov           Each maintenance medication was reviewed in detail including emphasizing most importantly the difference between maintenance and prns and under what circumstances the prns are to be triggered using an action plan format where appropriate.  Total time for H and P, chart review, counseling, reviewing hfa/neb device(s) and generating customized AVS unique to this office visit / same day charting = 34 min acute office visit

## 2021-06-26 DIAGNOSIS — H25813 Combined forms of age-related cataract, bilateral: Secondary | ICD-10-CM | POA: Diagnosis not present

## 2021-06-26 DIAGNOSIS — H04123 Dry eye syndrome of bilateral lacrimal glands: Secondary | ICD-10-CM | POA: Diagnosis not present

## 2021-06-26 DIAGNOSIS — H43812 Vitreous degeneration, left eye: Secondary | ICD-10-CM | POA: Diagnosis not present

## 2021-06-26 DIAGNOSIS — H35373 Puckering of macula, bilateral: Secondary | ICD-10-CM | POA: Diagnosis not present

## 2021-07-31 ENCOUNTER — Telehealth: Payer: Self-pay | Admitting: Internal Medicine

## 2021-07-31 ENCOUNTER — Ambulatory Visit: Payer: Medicare HMO | Admitting: Internal Medicine

## 2021-07-31 ENCOUNTER — Encounter: Payer: Self-pay | Admitting: Internal Medicine

## 2021-07-31 ENCOUNTER — Ambulatory Visit (INDEPENDENT_AMBULATORY_CARE_PROVIDER_SITE_OTHER): Payer: Medicare HMO

## 2021-07-31 DIAGNOSIS — R911 Solitary pulmonary nodule: Secondary | ICD-10-CM | POA: Diagnosis not present

## 2021-07-31 DIAGNOSIS — R918 Other nonspecific abnormal finding of lung field: Secondary | ICD-10-CM | POA: Diagnosis not present

## 2021-07-31 DIAGNOSIS — J449 Chronic obstructive pulmonary disease, unspecified: Secondary | ICD-10-CM

## 2021-07-31 DIAGNOSIS — M47814 Spondylosis without myelopathy or radiculopathy, thoracic region: Secondary | ICD-10-CM | POA: Diagnosis not present

## 2021-07-31 NOTE — Patient Instructions (Addendum)
No change in recommendations   Please remember to go to the lab and x-ray department  for your tests - we will call you with the results when they are available.      Please schedule a follow up visit in 12  months but call sooner if needed

## 2021-07-31 NOTE — Progress Notes (Unsigned)
Brandon Sanford, male    DOB: [birth year],   MRN: [id number]   Brief patient profile:  3 yowm quit smoking in 1997 with GOLD 2 COPD 2008  self referred to pulmonary clinic 09/07/2020 for cough / abn cxr from 07/22/20 when rx as CAP with levaquin 750 x 7 days  Seen 2008   COPD      -PFTs 06/03/07  FEV1 56% ratio 40% 27% improvement after bronchodilators and DLCO 87%    Bronchiectasis by CT 03/02/07 LLL     History of Present Illness  09/07/2020  Pulmonary/ 1st office eval/Wert  GOLD ? Still II COPD/bronchiectasis  maint on advair 500   Chief Complaint  Patient presents with   Consult    PNA 07/19/20, more SOB and dry cough since. Using Advair, feels like it helps  Baseline doe x fast walk/ incline on generic  advair 500  Dyspnea:  slow walk maybe a block /no desats  Cough: 24/7 dry hack  Sleep: on  side flat bed  SABA use: duoneb a week prior to OV   Had pleuritic cp R ant/lat resolved now/ no fever  Rec Stop generic advair = xiella 500  Start stiolto one puff each am - if breathing getting worse and you need to use the backup inhaler (albuterol) or nebulizer (albuterol/ipratropium) then increase stiolto to 2 puffs each am  Try prilosec otc 62m  Take 30-60 min before first meal of the day and Pepcid ac (famotidine)  otc 20 mg one @  bedtime until cough is completely gone for at least a week without the need for cough suppression For cough > ok to try delsym otc 2 tsp every 12 hours and add tessalon 200 mg three times daily if still coughing  Please schedule a follow up office visit in 4 weeks, sooner if needed  >>> late add :   f/u final cxr planned / recheck esr at 4 weeks     10/18/2020  f/u ov/Wert re: GOLD ? Still II COPD/bronchiectasis maint on stiolto 1 puff each am   Chief Complaint  Patient presents with   Follow-up   Dyspnea:  walks neighborhood x 15 min  = MMRC1 = can walk nl pace, flat grade, can't hurry or go uphills or steps s sob   Cough: min rattling daytime / better  sleeping s cough waking  > no mucus / stopped gerd rx prior to cough recurrence sev week prior to OV   Sleeping: bed is flat, sleeps on side/ one pillow SABA use: none  02: none  Covid status:   vax x 3 total  Rec For cough > mucinex dm up to 1200 mg every 12 hours as needed Try prilosec otc 282m Take 30-60 min before first meal of the day and Pepcid ac (famotidine) 20 mg one @  bedtime until cough is completely gone for at least a week without the need for cough suppression - if get worse restart I recommend the new omicron vaccine  If not doing better remember stiolto 2 puffs each am  Please schedule a follow up visit in 6 months but call sooner if needed     Phone call 12/28/20 Spoke with pt who states he spoke with on call doc last night r/t not being able to sleep and tingling feeling. Pt states he was not able to sleep at all last night. Pt states he has only been taking 1 puff in the morning of his Stiolto and  is having these issues.   NP recs  12/29/20  Stop taking ipatropium/albuterol nebulizers and albuterol inhaler. -Xopenex inhaler 2 puffs or 3 mL neb every 6 hours as needed for shortness of breath or wheezing -Breztri 160 2 puffs Twice daily, brush tongue and rinse after -Prednisone taper pack. 4 tabs for 2 days, then 3 tabs for 2 days, 2 tabs for 2 days, then 1 tab for 2 days, then stop. Take in AM with breakfast.  -Protonix 40 mg daily  -Claritin (loratidine) 10 mg daily  -Flonase nasal spray 1-2 sprays each nostril daily  -Saline nasal spray 2-3 times a day as needed for nasal congestion/postnasal drip until symptoms improve -Mucinex 600 mg twice daily as needed until symptoms improve Walking oximetry today SpO2 low 94% on room air EKG with sinus arrhythmia but with a normal rate.  Pulmonary function testing scheduled    01/23/2021  f/u ov/Wert re: ? GOLD 2 copd/   maint on breztri 2bid   Chief Complaint  Patient presents with   Follow-up    Breathing is unchanged. He  has not used his rescue inhaler or neb.    Dyspnea:  still walking neighborhood x 15 min and gym treadmill not really limted by breathing at slow  pace Cough: none  Sleeping: flat bed / one pillow  SABA use: none  02: none  Covid status:   vax x 3  Overt hb rx otc's prn  Rec Plan A = Automatic = Always=   change breztri after PFTs to  Stiolto 1 puff each am - if not doing as well ok to use 2 each am  Plan B = Backup (to supplement plan A, not to replace it) Only use your levoalbuterol inhaler as a rescue medication  Plan C = Crisis (instead of Plan B but only if Plan B stops working) - only use your levoalbuterol nebulizer if you first try Plan B  Try prilosec otc 34m  Take 30-60 min before first meal of the day and Pepcid ac (famotidine) 20 mg one @  bedtime until cough is completely gone for at least a week        06/12/2021  acute ov/Wert re: GOLD 2    maint on stiolto  much worse since cough / better with neb but did not follow ABC action plan   Chief Complaint  Patient presents with   Acute Visit   Dyspnea:  was better on one stiolto daily until coughing flared x one week prior to OV while off gerdrx  Cough: min mucoid  Sleeping: poorly since cough SABA use: just started 02: none  Rec Plan A = Automatic = Always=    Stiolto 2 puffs daily  Plan B = Backup (to supplement plan A, not to replace it) Only use your levoalbuterol inhaler as a rescue medication  Plan C = Crisis (instead of Plan B but only if Plan B stops working) - only use your levoalbuterol nebulizer if you first try Plan B Pantoprazole (protonix) 40 mg   Take  30-60 min before first meal of the day and Pepcid (famotidine)  20 mg after supper until return to office  GERD  diet Prednisone 10 mg take  4 each am x 2 days,   2 each am x 2 days,  1 each am x 2 days and stop  Leep July appt - call sooner if needed  Late add needs to go back to breztri once finished prednisone rx  07/31/2021  f/u ov/Wert re: GOLD 2  copd    maint on breztri  Chief Complaint  Patient presents with   Follow-up    Pt states his breathing is a lot better. Denies SOB.  Dyspnea:  walking neighborhood and treadmill same but limited L THR pain Cough: no cough  Sleeping: on flat bed on side/ one pillow  SABA use: rarely  02: none  Covid status:   3 vax / never infected   No obvious day to day or daytime variability or assoc excess/ purulent sputum or mucus plugs or hemoptysis or cp or chest tightness, subjective wheeze or overt sinus or hb symptoms.   Sleeping  without nocturnal  or early am exacerbation  of respiratory  c/o's or need for noct saba. Also denies any obvious fluctuation of symptoms with weather or environmental changes or other aggravating or alleviating factors except as outlined above   No unusual exposure hx or h/o childhood pna/ asthma or knowledge of premature birth.  Current Allergies, Complete Past Medical History, Past Surgical History, Family History, and Social History were reviewed in [employer] record.  ROS  The following are not active complaints unless bolded Hoarseness, sore throat, dysphagia, dental problems, itching, sneezing,  nasal congestion or discharge of excess mucus or purulent secretions, ear ache,   fever, chills, sweats, unintended wt loss or wt gain, classically pleuritic or exertional cp,  orthopnea pnd or arm/hand swelling  or leg swelling, presyncope, palpitations, abdominal pain, anorexia, nausea, vomiting, diarrhea  or change in bowel habits or change in bladder habits, change in stools or change in urine, dysuria, hematuria,  rash, arthralgias, visual complaints, headache, numbness, weakness or ataxia or problems with walking or coordination,  change in mood or  memory.        Current Meds  Medication Sig   albuterol (VENTOLIN HFA) 108 (90 Base) MCG/ACT inhaler Inhale 2 puffs into the lungs every 6 (six) hours as needed for wheezing or shortness of  breath.   amLODipine (NORVASC) 5 MG tablet Take 5 mg by mouth daily.   Budeson-Glycopyrrol-Formoterol (BREZTRI AEROSPHERE) 160-9-4.8 MCG/ACT AERO Inhale 2 puffs into the lungs in the morning and at bedtime.   hydrochlorothiazide (MICROZIDE) 12.5 MG capsule Take 12.5 mg by mouth daily.   levothyroxine (SYNTHROID, LEVOTHROID) 100 MCG tablet Take 100 mcg by mouth daily before breakfast.   Multiple Vitamin (MULTIVITAMIN) tablet Take 1 tablet by mouth daily.   Polyethyl Glycol-Propyl Glycol 0.4-0.3 % SOLN Place 1-2 drops into both eyes 3 (three) times daily as needed (dry eyes/irritated eyes).   simvastatin (ZOCOR) 40 MG tablet Take 40 mg by mouth every evening.    [DISCONTINUED] levalbuterol (XOPENEX) 0.63 MG/3ML nebulizer solution Take 3 mLs (0.63 mg total) by nebulization every 4 (four) hours as needed for wheezing or shortness of breath.                    Past Medical History:  Diagnosis Date   Allergy    Arthritis    Asthma    as a child   Cancer ([place])    skin - neck  "pre- cancer"   COPD (chronic obstructive pulmonary disease) ([place])    Depression    pt denies   Dyspnea    With exertion   GERD (gastroesophageal reflux disease)    tums if needed   Hyperlipidemia    Hypertension    Hypothyroidism    Inguinal hernia    Plantar fasciitis  Skin moles    Thyroid disease    Umbilical hernia    repaired         Objective:    07/31/2021      174  06/12/2021      174  01/23/2021         174   10/18/20 175 lb 6.4 oz (79.6 kg)  09/07/20 169 lb 8 oz (76.9 kg)  03/23/20 178 lb (80.7 kg)      Vital signs reviewed  07/31/2021  - Note at rest 02 sats  97% on RA   General appearance:    amb wm nad    HEENT : Oropharynx  clear  /  Nasal turbinates nl    NECK :  without  apparent JVD/ palpable Nodes/TM    LUNGS: no acc muscle use,  Nl contour chest which is clear to A and P bilaterally without cough on insp or exp maneuvers   CV:  RRR  no s3 or murmur or increase in  P2, and no edema   ABD:  soft and nontender with nl inspiratory excursion in the supine position. No bruits or organomegaly appreciated   MS:  Nl gait/ ext warm without deformities Or obvious joint restrictions  calf tenderness, cyanosis or clubbing    SKIN: warm and dry without lesions    NEURO:  alert, approp, nl sensorium with  no motor or cerebellar deficits apparent.     CXR PA and Lateral:   07/31/2021 :    I personally reviewed images and agree with radiology impression as follows:    Persistent nodular opacity left lung base. Recommend further evaluation with chest CT.  Labs ordered 07/31/2021    alpha one AT phenotype           Assessment

## 2021-07-31 NOTE — Telephone Encounter (Signed)
Received call report from [facility] on patient's cxr done on 07/31/21. Dr. Melvyn Novas please review the result/impression copied below:  IMPRESSION: Persistent nodular opacity left lung base. Recommend further evaluation with chest CT.  Please advise, thank you.

## 2021-08-01 ENCOUNTER — Encounter: Payer: Self-pay | Admitting: Internal Medicine

## 2021-08-01 NOTE — Assessment & Plan Note (Signed)
Quit smoking 1997  -  Bronchiectasis by CT 03/02/07 LLL -   PFTs 06/03/07  FEV1 56% ratio 40% 27% improvement after bronchodilators and DLCO 87% -  Allergy profile 09/07/20 >  Eos 0.3 /  IgE  65 - 01/23/2021  After extensive coaching inhaler device,  effectiveness =    90% continue breztri  - PFT's  01/24/21   FEV1 1.79 (67 % ) ratio 0.48  p 15%improvement from saba p ? breztri prior to study with DLCO  16.64 (72%)  FV curve classic concavity    07/31/2021  :    alpha one AT     Group D (now reclassified as E) in terms of symptom/risk and laba/lama/ICS  therefore appropriate rx at this point >>>  Continue breztri 2 bid and approp saba   F/u yearly          Each maintenance medication was reviewed in detail including emphasizing most importantly the difference between maintenance and prns and under what circumstances the prns are to be triggered using an action plan format where appropriate.  Total time for H and P, chart review, counseling, reviewing hfa device(s) and generating customized AVS unique to this office visit / same day charting = 32 min

## 2021-08-01 NOTE — Assessment & Plan Note (Addendum)
Quit smoking 1997 - see cxr 10/18/2020 LLL SPN 7 mm not seen on cxr 06/17/20 or ct  05/07/17 > still present 07/31/2021  - CT chest  rec 07/31/2021   Likely just an area of bronchiectasis but no way to be sure on cxr so best way to close the loop is CT as above  Discussed in detail all the  indications, usual  risks and alternatives  relative to the benefits with patient who agrees to proceed with w/u as outlined.

## 2021-08-01 NOTE — Telephone Encounter (Signed)
See result note.  

## 2021-08-03 LAB — ALPHA-1-ANTITRYPSIN PHENOTYP: A-1 Antitrypsin: 140 mg/dL (ref 101–187)

## 2021-08-03 NOTE — Progress Notes (Signed)
Called the pt and there was no answer- LMTCB    

## 2021-08-03 NOTE — Progress Notes (Signed)
Spoke with pt and notified of results per Dr. Wert. Pt verbalized understanding and denied any questions. 

## 2021-08-09 ENCOUNTER — Other Ambulatory Visit: Payer: Self-pay

## 2021-08-09 DIAGNOSIS — R9389 Abnormal findings on diagnostic imaging of other specified body structures: Secondary | ICD-10-CM

## 2021-08-09 NOTE — Telephone Encounter (Signed)
See result note from 07/31/21.

## 2021-08-21 ENCOUNTER — Ambulatory Visit (HOSPITAL_COMMUNITY)
Admission: RE | Admit: 2021-08-21 | Discharge: 2021-08-21 | Disposition: A | Discharge status: Home / Self Care | Payer: Medicare HMO | Source: Ambulatory Visit | Attending: Internal Medicine | Admitting: Internal Medicine

## 2021-08-21 DIAGNOSIS — J439 Emphysema, unspecified: Secondary | ICD-10-CM | POA: Diagnosis not present

## 2021-08-21 DIAGNOSIS — R9389 Abnormal findings on diagnostic imaging of other specified body structures: Secondary | ICD-10-CM | POA: Insufficient documentation

## 2021-08-21 DIAGNOSIS — J9811 Atelectasis: Secondary | ICD-10-CM | POA: Diagnosis not present

## 2021-08-28 DIAGNOSIS — Z0000 Encounter for general adult medical examination without abnormal findings: Secondary | ICD-10-CM | POA: Diagnosis not present

## 2021-08-28 DIAGNOSIS — E039 Hypothyroidism, unspecified: Secondary | ICD-10-CM | POA: Diagnosis not present

## 2021-08-28 DIAGNOSIS — E7800 Pure hypercholesterolemia, unspecified: Secondary | ICD-10-CM | POA: Diagnosis not present

## 2021-08-28 DIAGNOSIS — J449 Chronic obstructive pulmonary disease, unspecified: Secondary | ICD-10-CM | POA: Diagnosis not present

## 2021-08-28 DIAGNOSIS — I10 Essential (primary) hypertension: Secondary | ICD-10-CM | POA: Diagnosis not present

## 2021-11-21 ENCOUNTER — Ambulatory Visit
Admission: EM | Admit: 2021-11-21 | Discharge: 2021-11-21 | Disposition: A | Discharge status: Home / Self Care | Payer: Medicare HMO | Attending: Physician Assistant | Admitting: Physician Assistant

## 2021-11-21 DIAGNOSIS — J441 Chronic obstructive pulmonary disease with (acute) exacerbation: Secondary | ICD-10-CM | POA: Diagnosis not present

## 2021-11-21 DIAGNOSIS — Z1152 Encounter for screening for COVID-19: Secondary | ICD-10-CM | POA: Diagnosis not present

## 2021-11-21 LAB — RESP PANEL BY RT-PCR (FLU A&B, COVID) ARPGX2
Influenza A by PCR: NEGATIVE
Influenza B by PCR: NEGATIVE
SARS Coronavirus 2 by RT PCR: POSITIVE — AB

## 2021-11-21 MED ORDER — AZITHROMYCIN 250 MG PO TABS: 250.0000 mg | ORAL_TABLET | Freq: Every day | ORAL | 0 refills | Status: DC

## 2021-11-21 MED ORDER — PREDNISONE 20 MG PO TABS: 40.0000 mg | ORAL_TABLET | Freq: Every day | ORAL | 0 refills | Status: DC

## 2021-11-21 NOTE — ED Triage Notes (Signed)
Pt c/o fever, cough, at home since Monday. Denies nasal congestion, otalgia, headache. Last tylenol ~ 0630 today.

## 2021-11-21 NOTE — ED Provider Notes (Signed)
[facility]    CSN: [id number] Arrival date & time: 11/21/21  0817      History   Chief Complaint Chief Complaint  Patient presents with   Fever    HPI Brandon Sanford is a 81 y.o. male.   Patient here today for evaluation of fever, congestion and cough that started 2 days ago.  He denies any sore throat, ear pain, nausea, vomiting or diarrhea.  He has tried Tylenol which has helped somewhat with fever.  He notes Tmax of 100.1 Fahrenheit.  He does have known COPD.    The history is provided by the patient.  Fever Associated symptoms: cough   Associated symptoms: no congestion, no ear pain, no nausea, no sore throat and no vomiting     Past Medical History:  Diagnosis Date   Allergy    Arthritis    Asthma    as a child   Cancer ([place])    skin - neck  "pre- cancer"   COPD (chronic obstructive pulmonary disease) ([place])    Depression    pt denies   Dyspnea    With exertion   GERD (gastroesophageal reflux disease)    tums if needed   Hyperlipidemia    Hypertension    Hypothyroidism    Inguinal hernia    Plantar fasciitis    Skin moles    Thyroid disease    Umbilical hernia    repaired    Patient Active Problem List   Diagnosis Date Noted   Medication reaction 12/29/2020   History of tobacco abuse 12/29/2020   GERD without esophagitis 12/29/2020   Allergic rhinitis with postnasal drip 12/29/2020   Tachycardia 09/07/2020   CAP (community acquired pneumonia) 09/07/2020   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   Pain in 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   Unilateral primary osteoarthritis, left hip 78/93/8101   Umbilical pain 75/10/2583   COPD  GOLD II 06/03/2007   Solitary pulmonary nodule 04/27/2007   DYSPNEA 04/27/2007   HYPOTHYROIDISM 04/24/2007   DEPRESSION 04/24/2007   HYPERTENSION 04/24/2007    Past Surgical History:  Procedure Laterality Date   COLONOSCOPY     COLONOSCOPY W/ POLYPECTOMY     HERNIA REPAIR  27/7/82   umbilical hernia  and LIH repair    INGUINAL HERNIA REPAIR  11/26/2010   Procedure: HERNIA REPAIR INGUINAL ADULT;  Surgeon: Zenovia Jarred, MD;  Location: [place];  Service: General;  Laterality: Left;  left inguinal hernia repair with mesh   INGUINAL HERNIA REPAIR Right 02/04/2018   Procedure: REPAIR OF RIGHT INGUINAL HERNIA WITH MESH;  Surgeon: Georganna Skeans, MD;  Location: [place];  Service: General;  Laterality: Right;   INGUINAL HERNIA REPAIR Right 10/16/2018   Procedure: LAPAROSCOPIC RIGHT INGUINAL HERNIA;  Surgeon: Ralene Ok, MD;  Location: [place];  Service: General;  Laterality: Right;   INSERTION OF MESH Right 02/04/2018   Procedure: INSERTION OF MESH;  Surgeon: Georganna Skeans, MD;  Location: [place];  Service: General;  Laterality: Right;   INSERTION OF MESH N/A 10/16/2018   Procedure: Insertion Of Mesh;  Surgeon: Ralene Ok, MD;  Location: [place];  Service: General;  Laterality: N/A;   TONSILLECTOMY     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   Procedure: LEFT TOTAL HIP ARTHROPLASTY ANTERIOR APPROACH;  Surgeon: Mcarthur Rossetti, MD;  Location: [place];  Service: Orthopedics;  Laterality: Left;   UMBILICAL HERNIA REPAIR  11/26/2010   Procedure: HERNIA REPAIR UMBILICAL ADULT;  Surgeon: Zenovia Jarred, MD;  Location: [place];  Service: General;  Laterality: N/A;  umbilical hernia repair with mesh       Home Medications    Prior to Admission medications   Medication Sig Start Date End Date Taking? Authorizing Provider  azithromycin (ZITHROMAX) 250 MG tablet Take 1 tablet (250 mg total) by mouth daily. Take first 2 tablets together, then 1 every day until finished. 11/21/21  Yes Francene Finders, PA-C  predniSONE (DELTASONE) 20 MG tablet Take 2 tablets (40 mg total) by mouth daily with breakfast for 5 days. 11/21/21 11/26/21 Yes Francene Finders, PA-C  albuterol (VENTOLIN HFA) 108 (90 Base) MCG/ACT inhaler Inhale 2 puffs into the lungs every 6 (six) hours as  needed for wheezing or shortness of breath. 01/24/21   Tanda Rockers, MD  amLODipine (NORVASC) 5 MG tablet Take 5 mg by mouth daily.    [provider]  Budeson-Glycopyrrol-Formoterol (BREZTRI AEROSPHERE) 160-9-4.8 MCG/ACT AERO Inhale 2 puffs into the lungs in the morning and at bedtime. 06/13/21   Tanda Rockers, MD  hydrochlorothiazide (MICROZIDE) 12.5 MG capsule Take 12.5 mg by mouth daily.    [provider]  levothyroxine (SYNTHROID, LEVOTHROID) 100 MCG tablet Take 100 mcg by mouth daily before breakfast.    [provider]  Multiple Vitamin (MULTIVITAMIN) tablet Take 1 tablet by mouth daily.    [provider]  Polyethyl Glycol-Propyl Glycol 0.4-0.3 % SOLN Place 1-2 drops into both eyes 3 (three) times daily as needed (dry eyes/irritated eyes).    [provider]  simvastatin (ZOCOR) 40 MG tablet Take 40 mg by mouth every evening.     [provider]  TUMS 500 MG chewable tablet Chew 500 mg by mouth 2 (two) times daily as needed for indigestion or heartburn. 04/19/17   [provider]    Family History Family History  Problem Relation Age of Onset   Colon cancer Neg Hx    Colon polyps Neg Hx    Esophageal cancer Neg Hx    Stomach cancer Neg Hx    Rectal cancer Neg Hx     Social History Social History   Tobacco Use   Smoking status: Former    Years: 40.00    Types: Cigarettes    Quit date: 11/12/1995    Years since quitting: 26.0   Smokeless tobacco: Never  Vaping Use   Vaping Use: Never used  Substance Use Topics   Alcohol use: Yes    Alcohol/week: 2.0 standard drinks of alcohol    Types: 2 Cans of beer per week   Drug use: No     Allergies   Lisinopril, Chlorhexidine, and Pneumococcal vaccines   Review of Systems Review of Systems  Constitutional:  Positive for fever.  HENT:  Negative for congestion, ear pain and sore throat.   Eyes:  Negative for discharge and redness.  Respiratory:  Positive for  cough.   Gastrointestinal:  Negative for abdominal pain, nausea and vomiting.     Physical Exam Triage Vital Signs ED Triage Vitals  Enc Vitals Group     BP 11/21/21 0840 129/73     Pulse Rate 11/21/21 0840 94     Resp 11/21/21 0840 16     Temp 11/21/21 0840 98.1 F (36.7 C)     Temp Source 11/21/21 0840 Oral     SpO2 11/21/21 0840 92 %     Weight --      Height --  Head Circumference --      Peak Flow --      Pain Score 11/21/21 0844 0     Pain Loc --      Pain Edu? --      Excl. in [place]? --    No data found.  Updated Vital Signs BP 129/73 (BP Location: Right Arm)   Pulse 94   Temp 98.1 F (36.7 C) (Oral)   Resp 16   SpO2 92%      Physical Exam Vitals and nursing note reviewed.  Constitutional:      General: He is not in acute distress.    Appearance: Normal appearance. He is not ill-appearing.  HENT:     Head: Normocephalic and atraumatic.     Nose: Nose normal. No congestion.     Mouth/Throat:     Mouth: Mucous membranes are moist.     Pharynx: Oropharynx is clear. No oropharyngeal exudate or posterior oropharyngeal erythema.     Comments: PND noted Eyes:     Conjunctiva/sclera: Conjunctivae normal.  Cardiovascular:     Rate and Rhythm: Normal rate and regular rhythm.     Heart sounds: Normal heart sounds. No murmur heard. Pulmonary:     Effort: Pulmonary effort is normal. No respiratory distress.     Breath sounds: Normal breath sounds. No wheezing, rhonchi or rales.     Comments: Breath sounds mildly diminished throughout Skin:    General: Skin is warm and dry.  Neurological:     Mental Status: He is alert.  Psychiatric:        Mood and Affect: Mood normal.        Thought Content: Thought content normal.      UC Treatments / Results  Labs (all labs ordered are listed, but only abnormal results are displayed) Labs Reviewed  RESP PANEL BY RT-PCR (FLU A&B, COVID) [id number]    EKG   Radiology No results found.  Procedures Procedures  (including critical care time)  Medications Ordered in UC Medications - No data to display  Initial Impression / Assessment and Plan / UC Course  I have reviewed the triage vital signs and the nursing notes.  Pertinent labs & imaging results that were available during my care of the patient were reviewed by me and considered in my medical decision making (see chart for details).    We will treat to cover COPD exacerbation with steroids and Z-Pak.  Concern for viral etiology and will order COVID and flu screening.    Discussed antiviral therapy with patient however we do not have labs to review appropriate dosing.  Recommend he contact primary care provider if he would like to proceed with antiviral therapy if COVID-positive.  Final Clinical Impressions(s) / UC Diagnoses   Final diagnoses:  COPD exacerbation ([place])  Encounter for screening for COVID-19   Discharge Instructions   None    ED Prescriptions     Medication Sig Dispense Auth. Provider   predniSONE (DELTASONE) 20 MG tablet Take 2 tablets (40 mg total) by mouth daily with breakfast for 5 days. 10 tablet Ewell Poe F, PA-C   azithromycin (ZITHROMAX) 250 MG tablet Take 1 tablet (250 mg total) by mouth daily. Take first 2 tablets together, then 1 every day until finished. 6 tablet Francene Finders, PA-C      PDMP not reviewed this encounter.   Francene Finders, PA-C 11/21/21 [phone]

## 2021-11-26 ENCOUNTER — Encounter: Payer: Self-pay | Admitting: Physician Assistant

## 2021-11-26 ENCOUNTER — Emergency Department (HOSPITAL_COMMUNITY): Payer: Medicare HMO

## 2021-11-26 ENCOUNTER — Encounter (HOSPITAL_COMMUNITY): Payer: Self-pay | Admitting: *Deleted

## 2021-11-26 ENCOUNTER — Other Ambulatory Visit: Payer: Self-pay

## 2021-11-26 ENCOUNTER — Inpatient Hospital Stay (HOSPITAL_COMMUNITY)
Admission: EM | Admit: 2021-11-26 | Discharge: 2021-11-28 | DRG: 177 | Disposition: A | Discharge status: Home / Self Care | Payer: Medicare HMO | Attending: Internal Medicine | Admitting: Internal Medicine

## 2021-11-26 ENCOUNTER — Ambulatory Visit
Admission: EM | Admit: 2021-11-26 | Discharge: 2021-11-26 | Disposition: A | Discharge status: Home / Self Care | Payer: Medicare HMO | Attending: Physician Assistant | Admitting: Physician Assistant

## 2021-11-26 DIAGNOSIS — J449 Chronic obstructive pulmonary disease, unspecified: Secondary | ICD-10-CM | POA: Diagnosis not present

## 2021-11-26 DIAGNOSIS — R410 Disorientation, unspecified: Secondary | ICD-10-CM | POA: Diagnosis not present

## 2021-11-26 DIAGNOSIS — R509 Fever, unspecified: Secondary | ICD-10-CM | POA: Diagnosis not present

## 2021-11-26 DIAGNOSIS — I129 Hypertensive chronic kidney disease with stage 1 through stage 4 chronic kidney disease, or unspecified chronic kidney disease: Secondary | ICD-10-CM | POA: Diagnosis present

## 2021-11-26 DIAGNOSIS — Z85828 Personal history of other malignant neoplasm of skin: Secondary | ICD-10-CM | POA: Diagnosis not present

## 2021-11-26 DIAGNOSIS — E039 Hypothyroidism, unspecified: Secondary | ICD-10-CM | POA: Diagnosis present

## 2021-11-26 DIAGNOSIS — J441 Chronic obstructive pulmonary disease with (acute) exacerbation: Secondary | ICD-10-CM | POA: Diagnosis not present

## 2021-11-26 DIAGNOSIS — J440 Chronic obstructive pulmonary disease with acute lower respiratory infection: Secondary | ICD-10-CM | POA: Diagnosis not present

## 2021-11-26 DIAGNOSIS — I10 Essential (primary) hypertension: Secondary | ICD-10-CM | POA: Diagnosis not present

## 2021-11-26 DIAGNOSIS — R451 Restlessness and agitation: Secondary | ICD-10-CM | POA: Diagnosis present

## 2021-11-26 DIAGNOSIS — Z96642 Presence of left artificial hip joint: Secondary | ICD-10-CM | POA: Diagnosis present

## 2021-11-26 DIAGNOSIS — Z7951 Long term (current) use of inhaled steroids: Secondary | ICD-10-CM | POA: Diagnosis not present

## 2021-11-26 DIAGNOSIS — R0902 Hypoxemia: Secondary | ICD-10-CM | POA: Diagnosis present

## 2021-11-26 DIAGNOSIS — K219 Gastro-esophageal reflux disease without esophagitis: Secondary | ICD-10-CM | POA: Diagnosis present

## 2021-11-26 DIAGNOSIS — Z79899 Other long term (current) drug therapy: Secondary | ICD-10-CM | POA: Diagnosis not present

## 2021-11-26 DIAGNOSIS — Z79890 Hormone replacement therapy (postmenopausal): Secondary | ICD-10-CM | POA: Diagnosis not present

## 2021-11-26 DIAGNOSIS — U071 COVID-19: Secondary | ICD-10-CM

## 2021-11-26 DIAGNOSIS — Z87891 Personal history of nicotine dependence: Secondary | ICD-10-CM

## 2021-11-26 DIAGNOSIS — J1282 Pneumonia due to coronavirus disease 2019: Secondary | ICD-10-CM | POA: Diagnosis present

## 2021-11-26 DIAGNOSIS — E861 Hypovolemia: Secondary | ICD-10-CM | POA: Diagnosis present

## 2021-11-26 DIAGNOSIS — Z91048 Other nonmedicinal substance allergy status: Secondary | ICD-10-CM | POA: Diagnosis not present

## 2021-11-26 DIAGNOSIS — G4700 Insomnia, unspecified: Secondary | ICD-10-CM | POA: Diagnosis present

## 2021-11-26 DIAGNOSIS — R059 Cough, unspecified: Secondary | ICD-10-CM | POA: Diagnosis not present

## 2021-11-26 DIAGNOSIS — N1831 Chronic kidney disease, stage 3a: Secondary | ICD-10-CM | POA: Diagnosis not present

## 2021-11-26 DIAGNOSIS — Z888 Allergy status to other drugs, medicaments and biological substances status: Secondary | ICD-10-CM

## 2021-11-26 DIAGNOSIS — E785 Hyperlipidemia, unspecified: Secondary | ICD-10-CM | POA: Diagnosis present

## 2021-11-26 DIAGNOSIS — J189 Pneumonia, unspecified organism: Secondary | ICD-10-CM | POA: Diagnosis not present

## 2021-11-26 DIAGNOSIS — R911 Solitary pulmonary nodule: Secondary | ICD-10-CM | POA: Diagnosis not present

## 2021-11-26 LAB — URINALYSIS, ROUTINE W REFLEX MICROSCOPIC
Bilirubin Urine: NEGATIVE
Glucose, UA: NEGATIVE mg/dL
Hgb urine dipstick: NEGATIVE
Ketones, ur: NEGATIVE mg/dL
Leukocytes,Ua: NEGATIVE
Nitrite: NEGATIVE
Protein, ur: NEGATIVE mg/dL
Specific Gravity, Urine: 1.017 (ref 1.005–1.030)
pH: 6 (ref 5.0–8.0)

## 2021-11-26 LAB — CBC WITH DIFFERENTIAL/PLATELET
Abs Immature Granulocytes: 0.03 10*3/uL (ref 0.00–0.07)
Basophils Absolute: 0 10*3/uL (ref 0.0–0.1)
Basophils Relative: 0 %
Eosinophils Absolute: 0 10*3/uL (ref 0.0–0.5)
Eosinophils Relative: 0 %
HCT: 43.1 % (ref 39.0–52.0)
Hemoglobin: 14.3 g/dL (ref 13.0–17.0)
Immature Granulocytes: 0 %
Lymphocytes Relative: 12 %
Lymphs Abs: 1 10*3/uL (ref 0.7–4.0)
MCH: 29.9 pg (ref 26.0–34.0)
MCHC: 33.2 g/dL (ref 30.0–36.0)
MCV: 90 fL (ref 80.0–100.0)
Monocytes Absolute: 0.6 10*3/uL (ref 0.1–1.0)
Monocytes Relative: 8 %
Neutro Abs: 6.7 10*3/uL (ref 1.7–7.7)
Neutrophils Relative %: 80 %
Platelets: 178 10*3/uL (ref 150–400)
RBC: 4.79 MIL/uL (ref 4.22–5.81)
RDW: 12.2 % (ref 11.5–15.5)
WBC: 8.3 10*3/uL (ref 4.0–10.5)
nRBC: 0 % (ref 0.0–0.2)

## 2021-11-26 LAB — COMPREHENSIVE METABOLIC PANEL
ALT: 22 U/L (ref 0–44)
AST: 22 U/L (ref 15–41)
Albumin: 3.6 g/dL (ref 3.5–5.0)
Alkaline Phosphatase: 53 U/L (ref 38–126)
Anion gap: 9 (ref 5–15)
BUN: 25 mg/dL — ABNORMAL HIGH (ref 8–23)
CO2: 25 mmol/L (ref 22–32)
Calcium: 9 mg/dL (ref 8.9–10.3)
Chloride: 102 mmol/L (ref 98–111)
Creatinine, Ser: 1.37 mg/dL — ABNORMAL HIGH (ref 0.61–1.24)
GFR, Estimated: 52 mL/min — ABNORMAL LOW (ref >60)
Glucose, Bld: 105 mg/dL — ABNORMAL HIGH (ref 70–99)
Potassium: 3.7 mmol/L (ref 3.5–5.1)
Sodium: 136 mmol/L (ref 135–145)
Total Bilirubin: 0.7 mg/dL (ref 0.3–1.2)
Total Protein: 6.3 g/dL — ABNORMAL LOW (ref 6.5–8.1)

## 2021-11-26 LAB — D-DIMER, QUANTITATIVE: D-Dimer, Quant: 0.54 ug/mL-FEU — ABNORMAL HIGH (ref 0.00–0.50)

## 2021-11-26 LAB — BRAIN NATRIURETIC PEPTIDE: B Natriuretic Peptide: 49.4 pg/mL (ref 0.0–100.0)

## 2021-11-26 LAB — LACTATE DEHYDROGENASE: LDH: 106 U/L (ref 98–192)

## 2021-11-26 LAB — FERRITIN: Ferritin: 263 ng/mL (ref 24–336)

## 2021-11-26 LAB — PROCALCITONIN: Procalcitonin: <0.1 ng/mL

## 2021-11-26 MED ORDER — SODIUM CHLORIDE 0.9 % IV SOLN
100.0000 mg | Freq: Every day | INTRAVENOUS | Status: AC
  Administered 2021-11-27 – 2021-11-28 (×2): 100 mg via INTRAVENOUS
  Filled 2021-11-26 (×2): qty 20

## 2021-11-26 MED ORDER — AZITHROMYCIN 250 MG PO TABS
500.0000 mg | ORAL_TABLET | Freq: Every day | ORAL | Status: DC
  Administered 2021-11-27: 500 mg via ORAL
  Filled 2021-11-26: qty 2

## 2021-11-26 MED ORDER — MOMETASONE FURO-FORMOTEROL FUM 200-5 MCG/ACT IN AERO
2.0000 | INHALATION_SPRAY | Freq: Two times a day (BID) | RESPIRATORY_TRACT | Status: DC
  Administered 2021-11-26 – 2021-11-27 (×3): 2 via RESPIRATORY_TRACT
  Filled 2021-11-26: qty 8.8

## 2021-11-26 MED ORDER — HYDROCOD POLI-CHLORPHE POLI ER 10-8 MG/5ML PO SUER: 5.0000 mL | Freq: Two times a day (BID) | ORAL | Status: DC | PRN

## 2021-11-26 MED ORDER — SODIUM CHLORIDE 0.9 % IV SOLN
1.0000 g | Freq: Once | INTRAVENOUS | Status: AC
  Administered 2021-11-26: 1 g via INTRAVENOUS
  Filled 2021-11-26: qty 10

## 2021-11-26 MED ORDER — ACETAMINOPHEN 650 MG RE SUPP: 650.0000 mg | Freq: Four times a day (QID) | RECTAL | Status: DC | PRN

## 2021-11-26 MED ORDER — UMECLIDINIUM BROMIDE 62.5 MCG/ACT IN AEPB
1.0000 | INHALATION_SPRAY | Freq: Every day | RESPIRATORY_TRACT | Status: DC
  Administered 2021-11-27: 1 via RESPIRATORY_TRACT
  Filled 2021-11-26: qty 7

## 2021-11-26 MED ORDER — SIMVASTATIN 20 MG PO TABS
40.0000 mg | ORAL_TABLET | Freq: Every day | ORAL | Status: DC
  Administered 2021-11-26 – 2021-11-27 (×2): 40 mg via ORAL
  Filled 2021-11-26 (×2): qty 2

## 2021-11-26 MED ORDER — SODIUM CHLORIDE 0.9 % IV SOLN
200.0000 mg | Freq: Once | INTRAVENOUS | Status: AC
  Administered 2021-11-27: 200 mg via INTRAVENOUS
  Filled 2021-11-26: qty 40

## 2021-11-26 MED ORDER — LEVOTHYROXINE SODIUM 100 MCG PO TABS
100.0000 ug | ORAL_TABLET | Freq: Every day | ORAL | Status: DC
  Administered 2021-11-27 – 2021-11-28 (×2): 100 ug via ORAL
  Filled 2021-11-26 (×2): qty 1

## 2021-11-26 MED ORDER — ACETAMINOPHEN 325 MG PO TABS: 650.0000 mg | ORAL_TABLET | Freq: Four times a day (QID) | ORAL | Status: DC | PRN

## 2021-11-26 MED ORDER — ONDANSETRON HCL 4 MG PO TABS: 4.0000 mg | ORAL_TABLET | Freq: Four times a day (QID) | ORAL | Status: DC | PRN

## 2021-11-26 MED ORDER — SODIUM CHLORIDE 0.9 % IV SOLN
500.0000 mg | Freq: Once | INTRAVENOUS | Status: AC
  Administered 2021-11-26: 500 mg via INTRAVENOUS
  Filled 2021-11-26: qty 5

## 2021-11-26 MED ORDER — ONDANSETRON HCL 4 MG/2ML IJ SOLN: 4.0000 mg | Freq: Four times a day (QID) | INTRAMUSCULAR | Status: DC | PRN

## 2021-11-26 MED ORDER — AMLODIPINE BESYLATE 5 MG PO TABS
5.0000 mg | ORAL_TABLET | Freq: Every day | ORAL | Status: DC
  Administered 2021-11-27 – 2021-11-28 (×2): 5 mg via ORAL
  Filled 2021-11-26 (×2): qty 1

## 2021-11-26 MED ORDER — SENNOSIDES-DOCUSATE SODIUM 8.6-50 MG PO TABS: 1.0000 | ORAL_TABLET | Freq: Every evening | ORAL | Status: DC | PRN

## 2021-11-26 MED ORDER — ALBUTEROL SULFATE HFA 108 (90 BASE) MCG/ACT IN AERS: 2.0000 | INHALATION_SPRAY | RESPIRATORY_TRACT | Status: DC | PRN

## 2021-11-26 MED ORDER — SODIUM CHLORIDE 0.9 % IV SOLN
2.0000 g | INTRAVENOUS | Status: DC
  Administered 2021-11-27: 2 g via INTRAVENOUS
  Filled 2021-11-26: qty 20

## 2021-11-26 MED ORDER — ENOXAPARIN SODIUM 40 MG/0.4ML IJ SOSY
40.0000 mg | PREFILLED_SYRINGE | INTRAMUSCULAR | Status: DC
  Administered 2021-11-26 – 2021-11-27 (×2): 40 mg via SUBCUTANEOUS
  Filled 2021-11-26 (×2): qty 0.4

## 2021-11-26 MED ORDER — BUDESON-GLYCOPYRROL-FORMOTEROL 160-9-4.8 MCG/ACT IN AERO: 2.0000 | INHALATION_SPRAY | Freq: Two times a day (BID) | RESPIRATORY_TRACT | Status: DC

## 2021-11-26 MED ORDER — MELATONIN 3 MG PO TABS
3.0000 mg | ORAL_TABLET | Freq: Every evening | ORAL | Status: DC | PRN
  Administered 2021-11-26 – 2021-11-27 (×2): 3 mg via ORAL
  Filled 2021-11-26 (×2): qty 1

## 2021-11-26 MED ORDER — GUAIFENESIN-DM 100-10 MG/5ML PO SYRP
10.0000 mL | ORAL_SOLUTION | ORAL | Status: DC | PRN
  Administered 2021-11-27: 10 mL via ORAL
  Filled 2021-11-26: qty 10

## 2021-11-26 NOTE — ED Triage Notes (Signed)
Pt c/o worsening cough, sob, dyspnea, since covid dx several days ago. Wife states pt was feeling fine until yesterday when he started to get worse. Pt unable to sit still during triage leaning back and forth. Sweating. States copd, does not use o2 at home, does use inhalers as prescribed last used today. States checks o2 at home usually 96-97% however it triage it is 91%

## 2021-11-26 NOTE — ED Notes (Signed)
Patient is being discharged from the Urgent Care and sent to the Emergency Department via [facility] . Per Provider Ewell Poe, patient is in need of higher level of care due to Hypoxia ( covid +). Patient is aware and verbalizes understanding of plan of care.    Vitals:   11/26/21 1342  BP: (!) 156/85  Pulse: 96  Resp: (!) 24  Temp: 99.4 F (37.4 C)  SpO2: 91%

## 2021-11-26 NOTE — ED Triage Notes (Signed)
Pt reports worsening cough, sob, fatigue, weakness, fever, and nausea.  Diagnosed with covid 1 week ago.  Hx copd.

## 2021-11-26 NOTE — ED Provider Notes (Signed)
[facility]    CSN: [id number] Arrival date & time: 11/26/21  1334      History   Chief Complaint Chief Complaint  Patient presents with   Shortness of Breath    HPI Brandon Sanford is a 81 y.o. male.   Patient here today for evaluation of worsening shortness of breath, confusion, and stating he feels as if he will "die" after being diagnosed with COVID 5 days ago.  His wife brings him in today.  They report that patient was doing well and had improvement after recent antibiotic therapy, and states that only yesterday evening that he started to feel worse.  Patient reports he did not sleep last night.  He notes worsening shortness of breath and cough.  He has not had fever that he is aware of.  Wife reports he has not been eating and drinking as usual.  The history is provided by the patient.  Shortness of Breath Associated symptoms: cough and diaphoresis   Associated symptoms: no fever and no vomiting     Past Medical History:  Diagnosis Date   Allergy    Arthritis    Asthma    as a child   Cancer ([place])    skin - neck  "pre- cancer"   COPD (chronic obstructive pulmonary disease) ([place])    Depression    pt denies   Dyspnea    With exertion   GERD (gastroesophageal reflux disease)    tums if needed   Hyperlipidemia    Hypertension    Hypothyroidism    Inguinal hernia    Plantar fasciitis    Skin moles    Thyroid disease    Umbilical hernia    repaired    Patient Active Problem List   Diagnosis Date Noted   Medication reaction 12/29/2020   History of tobacco abuse 12/29/2020   GERD without esophagitis 12/29/2020   Allergic rhinitis with postnasal drip 12/29/2020   Tachycardia 09/07/2020   CAP (community acquired pneumonia) 09/07/2020   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   Pain in 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   Unilateral primary osteoarthritis, left hip 70/01/7492   Umbilical pain 49/67/5916   COPD  GOLD II 06/03/2007   Solitary  pulmonary nodule 04/27/2007   DYSPNEA 04/27/2007   HYPOTHYROIDISM 04/24/2007   DEPRESSION 04/24/2007   HYPERTENSION 04/24/2007    Past Surgical History:  Procedure Laterality Date   COLONOSCOPY     COLONOSCOPY W/ POLYPECTOMY     HERNIA REPAIR  38/4/66   umbilical hernia and LIH repair    INGUINAL HERNIA REPAIR  11/26/2010   Procedure: HERNIA REPAIR INGUINAL ADULT;  Surgeon: Zenovia Jarred, MD;  Location: [place];  Service: General;  Laterality: Left;  left inguinal hernia repair with mesh   INGUINAL HERNIA REPAIR Right 02/04/2018   Procedure: REPAIR OF RIGHT INGUINAL HERNIA WITH MESH;  Surgeon: Georganna Skeans, MD;  Location: [place];  Service: General;  Laterality: Right;   INGUINAL HERNIA REPAIR Right 10/16/2018   Procedure: LAPAROSCOPIC RIGHT INGUINAL HERNIA;  Surgeon: Ralene Ok, MD;  Location: [place];  Service: General;  Laterality: Right;   INSERTION OF MESH Right 02/04/2018   Procedure: INSERTION OF MESH;  Surgeon: Georganna Skeans, MD;  Location: [place];  Service: General;  Laterality: Right;   INSERTION OF MESH N/A 10/16/2018   Procedure: Insertion Of Mesh;  Surgeon: Ralene Ok, MD;  Location: [place];  Service: General;  Laterality: N/A;   TONSILLECTOMY  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   Procedure: LEFT TOTAL HIP ARTHROPLASTY ANTERIOR APPROACH;  Surgeon: Mcarthur Rossetti, MD;  Location: [place];  Service: Orthopedics;  Laterality: Left;   UMBILICAL HERNIA REPAIR  11/26/2010   Procedure: HERNIA REPAIR UMBILICAL ADULT;  Surgeon: Zenovia Jarred, MD;  Location: [place];  Service: General;  Laterality: N/A;  umbilical hernia repair with mesh       Home Medications    Prior to Admission medications   Medication Sig Start Date End Date Taking? Authorizing Provider  albuterol (VENTOLIN HFA) 108 (90 Base) MCG/ACT inhaler Inhale 2 puffs into the lungs every 6 (six) hours as needed for wheezing or shortness of breath. 01/24/21    Tanda Rockers, MD  amLODipine (NORVASC) 5 MG tablet Take 5 mg by mouth daily.    [provider]  azithromycin (ZITHROMAX) 250 MG tablet Take 1 tablet (250 mg total) by mouth daily. Take first 2 tablets together, then 1 every day until finished. 11/21/21   Francene Finders, PA-C  Budeson-Glycopyrrol-Formoterol (BREZTRI AEROSPHERE) 160-9-4.8 MCG/ACT AERO Inhale 2 puffs into the lungs in the morning and at bedtime. 06/13/21   Tanda Rockers, MD  hydrochlorothiazide (MICROZIDE) 12.5 MG capsule Take 12.5 mg by mouth daily.    [provider]  levothyroxine (SYNTHROID, LEVOTHROID) 100 MCG tablet Take 100 mcg by mouth daily before breakfast.    [provider]  Multiple Vitamin (MULTIVITAMIN) tablet Take 1 tablet by mouth daily.    [provider]  Polyethyl Glycol-Propyl Glycol 0.4-0.3 % SOLN Place 1-2 drops into both eyes 3 (three) times daily as needed (dry eyes/irritated eyes).    [provider]  predniSONE (DELTASONE) 20 MG tablet Take 2 tablets (40 mg total) by mouth daily with breakfast for 5 days. 11/21/21 11/26/21  Francene Finders, PA-C  simvastatin (ZOCOR) 40 MG tablet Take 40 mg by mouth every evening.     [provider]  TUMS 500 MG chewable tablet Chew 500 mg by mouth 2 (two) times daily as needed for indigestion or heartburn. 04/19/17   [provider]    Family History Family History  Problem Relation Age of Onset   Colon cancer Neg Hx    Colon polyps Neg Hx    Esophageal cancer Neg Hx    Stomach cancer Neg Hx    Rectal cancer Neg Hx     Social History Social History   Tobacco Use   Smoking status: Former    Years: 40.00    Types: Cigarettes    Quit date: 11/12/1995    Years since quitting: 26.0   Smokeless tobacco: Never  Vaping Use   Vaping Use: Never used  Substance Use Topics   Alcohol use: Yes    Alcohol/week: 2.0 standard drinks of alcohol    Types: 2 Cans of beer per week   Drug use: No      Allergies   Lisinopril, Chlorhexidine, and Pneumococcal vaccines   Review of Systems Review of Systems  Constitutional:  Positive for diaphoresis. Negative for chills and fever.  Eyes:  Negative for discharge and redness.  Respiratory:  Positive for cough and shortness of breath.   Gastrointestinal:  Negative for diarrhea and vomiting.     Physical Exam Triage Vital Signs ED Triage Vitals [11/26/21 1342]  Enc Vitals Group     BP (!) 156/85     Pulse Rate 96     Resp (!) 24  Temp 99.4 F (37.4 C)     Temp Source Oral     SpO2 91 %     Weight      Height      Head Circumference      Peak Flow      Pain Score 0     Pain Loc      Pain Edu?      Excl. in [place]?    No data found.  Updated Vital Signs BP (!) 156/85 (BP Location: Right Arm)   Pulse 96   Temp 99.4 F (37.4 C) (Oral)   Resp (!) 24   SpO2 91%      Physical Exam Vitals and nursing note reviewed.  Constitutional:      Comments: Mildly diaphoretic, appears uncomfortable, rocking back and forth, mild shortness of breath noted  HENT:     Head: Normocephalic and atraumatic.  Eyes:     Conjunctiva/sclera: Conjunctivae normal.  Cardiovascular:     Rate and Rhythm: Normal rate.  Pulmonary:     Effort: Respiratory distress (mildly SHOB, increased RR) present.  Neurological:     Mental Status: He is alert.      UC Treatments / Results  Labs (all labs ordered are listed, but only abnormal results are displayed) Labs Reviewed - No data to display  EKG   Radiology No results found.  Procedures Procedures (including critical care time)  Medications Ordered in UC Medications - No data to display  Initial Impression / Assessment and Plan / UC Course  I have reviewed the triage vital signs and the nursing notes.  Pertinent labs & imaging results that were available during my care of the patient were reviewed by me and considered in my medical decision making (see chart for details).     EKG without significant changes compared to prior. Recommended further evaluation in the emergency room given worsening symptoms and known COVID, patient is agreeable.  Recommended transport by EMS given appearance of patient which patient is also agreeable to.  [facility] to transport.  Final Clinical Impressions(s) / UC Diagnoses   Final diagnoses:  COVID-19  COPD exacerbation ([place])  Subacute confusional state   Discharge Instructions   None    ED Prescriptions   None    PDMP not reviewed this encounter.   Francene Finders, PA-C 11/26/21 1417

## 2021-11-26 NOTE — ED Provider Triage Note (Signed)
Emergency Medicine Provider Triage Evaluation Note  JHOAN SCHMIEDER , a 81 y.o. male  was evaluated in triage.  Pt complains of generalized weakness.  Was evaluated urgent care and told to come to the emergency department for his symptoms.  Patient is he was recently diagnosed with COVID.  Was not given any medications for his COVID.  Has a history of COPD and has been using his inhaler.  Has associated cough denies chest pain, rhinorrhea, nasal congestion.  Review of Systems  Positive:  Negative:   Physical Exam  BP 110/70   Pulse 85   Temp 98.5 F (36.9 C)   Resp 20   SpO2 94%  Gen:   Awake, no distress   Resp:  Normal effort  MSK:   Moves extremities without difficulty  Other:  Able to speak in clear complete sentences  Medical Decision Making  Medically screening exam initiated at 4:17 PM.  Appropriate orders placed.  TUKKER BYRNS was informed that the remainder of the evaluation will be completed by another provider, this initial triage assessment does not replace that evaluation, and the importance of remaining in the ED until their evaluation is complete.  Work-up initiated   Sherece Gambrill A, PA-C 11/26/21 1619

## 2021-11-26 NOTE — Plan of Care (Signed)
  Problem: Activity: Goal: Ability to tolerate increased activity will improve Outcome: Progressing   Problem: Clinical Measurements: Goal: Ability to maintain a body temperature in the normal range will improve Outcome: Progressing   Problem: Respiratory: Goal: Ability to maintain adequate ventilation will improve Outcome: Progressing Goal: Ability to maintain a clear airway will improve Outcome: Progressing   Problem: Education: Goal: Knowledge of risk factors and measures for prevention of condition will improve Outcome: Progressing   Problem: Coping: Goal: Psychosocial and spiritual needs will be supported Outcome: Progressing   Problem: Respiratory: Goal: Will maintain a patent airway Outcome: Progressing Goal: Complications related to the disease process, condition or treatment will be avoided or minimized Outcome: Progressing   Problem: Education: Goal: Knowledge of General Education information will improve Description: Including pain rating scale, medication(s)/side effects and non-pharmacologic comfort measures Outcome: Progressing   Problem: Health Behavior/Discharge Planning: Goal: Ability to manage health-related needs will improve Outcome: Progressing   Problem: Clinical Measurements: Goal: Ability to maintain clinical measurements within normal limits will improve Outcome: Progressing Goal: Will remain free from infection Outcome: Progressing Goal: Diagnostic test results will improve Outcome: Progressing Goal: Respiratory complications will improve Outcome: Progressing Goal: Cardiovascular complication will be avoided Outcome: Progressing   Problem: Activity: Goal: Risk for activity intolerance will decrease Outcome: Progressing   Problem: Nutrition: Goal: Adequate nutrition will be maintained Outcome: Progressing   Problem: Coping: Goal: Level of anxiety will decrease Outcome: Progressing   Problem: Elimination: Goal: Will not experience  complications related to bowel motility Outcome: Progressing Goal: Will not experience complications related to urinary retention Outcome: Progressing   Problem: Pain Managment: Goal: General experience of comfort will improve Outcome: Progressing   Problem: Safety: Goal: Ability to remain free from injury will improve Outcome: Progressing   Problem: Skin Integrity: Goal: Risk for impaired skin integrity will decrease Outcome: Progressing

## 2021-11-26 NOTE — ED Notes (Signed)
Ambulated pt, pts SpO2 stayed between 95-98 on a good pleth. Pt got light-headed a couple times and lost his footing. Pt also stated they were [facility].

## 2021-11-26 NOTE — H&P (Signed)
History and Physical    Brandon Sanford MHD:[id number] DOB: 1940/04/10 DOA: 11/26/2021  PCP: Brandon Sanford, Brandon Sa, MD   Patient coming from:  Home   Chief Complaint: Cough, SOB, fever  HPI: Brandon Sanford is a 81 y.o. male with medical history significant for COPD, hypertension, hypothyroidism, CKD 3A, and recent COVID-19 diagnosis who presents emergency department with fevers and worsening cough and shortness of breath.  Symptoms began on 11/19/2021 he was seen at an urgent care on 11/21/2021 where he was started on a prednisone and azithromycin for COPD exacerbation, found to have negative influenza but positive COVID-19 PCR.  He has not been on any specific treatment for COVID.  He began to worsen significantly yesterday with increased cough and dyspnea.  He returned to urgent care where his oxygen saturation was as low as 91%, and this prompted his transfer to the emergency department. His cough has been productive. He denies chest pain, hemoptysis, or leg swelling.    ED Course: Upon arrival to the ED, patient is found to be afebrile and saturating mid 90s on room air with ambulation but tachypneic and severely fatigued.  Chest x-ray demonstrates an ill-defined opacity in the left base.  Chemistry panel notable for creatinine of 1.37.  CBC unremarkable.  He was treated with Rocephin and azithromycin in the ED.  Review of Systems:  All other systems reviewed and apart from HPI, are negative.  Past Medical History:  Diagnosis Date   Allergy    Arthritis    Asthma    as a child   Cancer ([place])    skin - neck  "pre- cancer"   COPD (chronic obstructive pulmonary disease) ([place])    Depression    pt denies   Dyspnea    With exertion   GERD (gastroesophageal reflux disease)    tums if needed   Hyperlipidemia    Hypertension    Hypothyroidism    Inguinal hernia    Plantar fasciitis    Skin moles    Thyroid disease    Umbilical hernia    repaired    Past Surgical History:  Procedure  Laterality Date   COLONOSCOPY     COLONOSCOPY W/ POLYPECTOMY     HERNIA REPAIR  21/1/94   umbilical hernia and LIH repair    INGUINAL HERNIA REPAIR  11/26/2010   Procedure: HERNIA REPAIR INGUINAL ADULT;  Surgeon: Zenovia Jarred, MD;  Location: [facility];  Service: General;  Laterality: Left;  left inguinal hernia repair with mesh   INGUINAL HERNIA REPAIR Right 02/04/2018   Procedure: REPAIR OF RIGHT INGUINAL HERNIA WITH MESH;  Surgeon: Georganna Skeans, MD;  Location: [facility];  Service: General;  Laterality: Right;   INGUINAL HERNIA REPAIR Right 10/16/2018   Procedure: LAPAROSCOPIC RIGHT INGUINAL HERNIA;  Surgeon: Ralene Ok, MD;  Location: [facility];  Service: General;  Laterality: Right;   INSERTION OF MESH Right 02/04/2018   Procedure: INSERTION OF MESH;  Surgeon: Georganna Skeans, MD;  Location: [facility];  Service: General;  Laterality: Right;   INSERTION OF MESH N/A 10/16/2018   Procedure: Insertion Of Mesh;  Surgeon: Ralene Ok, MD;  Location: [facility];  Service: General;  Laterality: N/A;   TONSILLECTOMY     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 His smoking use included cigarettes. He has never used smokeless tobacco. He reports current alcohol use of about 2.0 standard drinks of alcohol per week. He reports that he does not use drugs.  Allergies  Allergen Reactions   Lisinopril Cough   Chlorhexidine Rash   Pneumococcal Vaccines Rash    Family History  Problem Relation Age of Onset   Colon cancer Neg Hx    Colon polyps  Neg Hx    Esophageal cancer Neg Hx    Stomach cancer Neg Hx    Rectal cancer Neg Hx      Prior to Admission medications   Medication Sig Start Date End Date Taking? Authorizing Provider  albuterol (VENTOLIN HFA) 108 (90 Base) MCG/ACT inhaler Inhale 2 puffs into the lungs every 6 (six) hours as needed for wheezing or shortness of breath. 01/24/21   Tanda Rockers, MD  amLODipine (NORVASC) 5 MG tablet Take 5 mg by mouth daily.    [provider]  azithromycin (ZITHROMAX) 250 MG tablet Take 1 tablet (250 mg total) by mouth daily. Take first 2 tablets together, then 1 every day until finished. 11/21/21   Francene Finders, PA-C  Budeson-Glycopyrrol-Formoterol (BREZTRI AEROSPHERE) 160-9-4.8 MCG/ACT AERO Inhale 2 puffs into the lungs in the morning and at bedtime. 06/13/21   Tanda Rockers, MD  hydrochlorothiazide (MICROZIDE) 12.5 MG capsule Take 12.5 mg by mouth daily.    [provider]  levothyroxine (SYNTHROID, LEVOTHROID) 100 MCG tablet Take 100 mcg by mouth daily before breakfast.    [provider]  Multiple Vitamin (MULTIVITAMIN) tablet Take 1 tablet by mouth daily.    [provider]  Polyethyl Glycol-Propyl Glycol 0.4-0.3 % SOLN Place 1-2 drops into both eyes 3 (three) times daily as needed (dry eyes/irritated eyes).    [provider]  predniSONE (DELTASONE) 20 MG tablet Take 2 tablets (40 mg total) by mouth daily with breakfast for 5 days. 11/21/21 11/26/21  Francene Finders, PA-C  simvastatin (ZOCOR) 40 MG tablet Take 40 mg by mouth every evening.     [provider]  TUMS 500 MG chewable tablet Chew 500 mg by mouth 2 (two) times daily as needed for indigestion or heartburn. 04/19/17   [provider]    Physical Exam: Vitals:   11/26/21 1506 11/26/21 1907  BP: 110/70 123/68  Pulse: 85 68  Resp: 20 18  Temp: 98.5 F (36.9 C) 98.5 F (36.9 C)  SpO2: 94% 95%    Constitutional: NAD, no pallor or diaphoresis  Eyes: PERTLA,  lids and conjunctivae normal ENMT: Mucous membranes are moist. Posterior pharynx clear of any exudate or lesions.   Neck: supple, no masses  Respiratory: Coarse rales, no wheezing. No accessory muscle use.  Cardiovascular: S1 & S2 heard, regular rate and rhythm. No extremity edema.   Abdomen: No distension, no tenderness, soft. Bowel sounds active.  Musculoskeletal: no clubbing / cyanosis. No joint deformity upper and lower extremities.   Skin: no significant rashes, lesions, ulcers. Warm, dry, well-perfused. Neurologic: Gross hearing deficit. Moving all extremities. Alert and oriented.  Psychiatric: Calm. Cooperative.    Labs and Imaging on Admission: I have personally reviewed following labs and imaging studies  CBC: Recent Labs  Lab 11/26/21 1649  WBC  8.3  NEUTROABS 6.7  HGB 14.3  HCT 43.1  MCV 90.0  PLT 250   Basic Metabolic Panel: Recent Labs  Lab 11/26/21 1649  NA 136  K 3.7  CL 102  CO2 25  GLUCOSE 105*  BUN 25*  CREATININE 1.37*  CALCIUM 9.0   GFR: CrCl cannot be calculated (Unknown ideal weight.). Liver Function Tests: Recent Labs  Lab 11/26/21 1649  AST 22  ALT 22  ALKPHOS 53  BILITOT 0.7  PROT 6.3*  ALBUMIN 3.6   No results for input(s): "LIPASE", "AMYLASE" in the last 168 hours. No results for input(s): "AMMONIA" in the last 168 hours. Coagulation Profile: No results for input(s): "INR", "PROTIME" in the last 168 hours. Cardiac Enzymes: No results for input(s): "CKTOTAL", "CKMB", "CKMBINDEX", "TROPONINI" in the last 168 hours. BNP (last 3 results) No results for input(s): "PROBNP" in the last 8760 hours. HbA1C: No results for input(s): "HGBA1C" in the last 72 hours. CBG: No results for input(s): "GLUCAP" in the last 168 hours. Lipid Profile: No results for input(s): "CHOL", "HDL", "LDLCALC", "TRIG", "CHOLHDL", "LDLDIRECT" in the last 72 hours. Thyroid Function Tests: No results for input(s): "TSH", "T4TOTAL", "FREET4", "T3FREE",  "THYROIDAB" in the last 72 hours. Anemia Panel: No results for input(s): "VITAMINB12", "FOLATE", "FERRITIN", "TIBC", "IRON", "RETICCTPCT" in the last 72 hours. Urine analysis: No results found for: "COLORURINE", "APPEARANCEUR", "LABSPEC", "PHURINE", "GLUCOSEU", "HGBUR", "BILIRUBINUR", "KETONESUR", "PROTEINUR", "UROBILINOGEN", "NITRITE", "LEUKOCYTESUR" Sepsis Labs: '@LABRCNTIP'$ (procalcitonin:4,lacticidven:4) ) Recent Results (from the past 240 hour(s))  Resp Panel by RT-PCR (Flu A&B, Covid) Anterior Nasal Swab     Status: Abnormal   Collection Time: 11/21/21  9:19 AM   Specimen: Anterior Nasal Swab  Result Value Ref Range Status   SARS Coronavirus 2 by RT PCR POSITIVE (A) NEGATIVE Final    Comment: (NOTE) SARS-CoV-2 target nucleic acids are DETECTED.  The SARS-CoV-2 RNA is generally detectable in upper respiratory specimens during the acute phase of infection. Positive results are indicative of the presence of the identified virus, but do not rule out bacterial infection or co-infection with other pathogens not detected by the test. Clinical correlation with patient history and other diagnostic information is necessary to determine patient infection status. The expected result is Negative.  Fact Sheet for Patients: EntrepreneurPulse.com.au  Fact Sheet for Healthcare Providers: IncredibleEmployment.be  This test is not yet approved or cleared by the [place] FDA and  has been authorized for detection and/or diagnosis of SARS-CoV-2 by FDA under an Emergency Use Authorization (EUA).  This EUA will remain in effect (meaning this test can be used) for the duration of  the COVID-19 declaration under Section 564(b)(1) of the A ct, 21 U.S.C. section 360bbb-3(b)(1), unless the authorization is terminated or revoked sooner.     Influenza A by PCR NEGATIVE NEGATIVE Final   Influenza B by PCR NEGATIVE NEGATIVE Final    Comment: (NOTE) The Xpert  Xpress SARS-CoV-2/FLU/RSV plus assay is intended as an aid in the diagnosis of influenza from Nasopharyngeal swab specimens and should not be used as a sole basis for treatment. Nasal washings and aspirates are unacceptable for Xpert Xpress SARS-CoV-2/FLU/RSV testing.  Fact Sheet for Patients: EntrepreneurPulse.com.au  Fact Sheet for Healthcare Providers: IncredibleEmployment.be  This test is not yet approved or cleared by the [place] FDA and has been authorized for detection and/or diagnosis of SARS-CoV-2 by FDA under an Emergency Use Authorization (EUA). This EUA will remain in effect (meaning this test can be used) for the duration of the COVID-19 declaration  under Section 564(b)(1) of the Act, 21 U.S.C. section 360bbb-3(b)(1), unless the authorization is terminated or revoked.  Performed at [facility], [place] [address], [place], [place] [address]      Radiological Exams on Admission: DG Chest 2 View  Result Date: 11/26/2021 CLINICAL DATA:  Provided history: Cough, COVID + EXAM: CHEST - 2 VIEW COMPARISON:  Prior chest radiographs 07/31/2021 and earlier. Chest CT 08/21/2021 FINDINGS: Heart size within normal limits. Aortic atherosclerosis. Bibasilar pulmonary scarring. Superimposed ill-defined opacity within the left lung base, which is new from the prior chest radiographs of 07/31/2021 and may reflect atelectasis and/or pneumonia. No appreciable airspace consolidation on the right. Known pulmonary nodules, better appreciated on the prior chest CT of 08/21/2021. No evidence of pleural effusion or pneumothorax. Degenerative changes of the spine. IMPRESSION: Ill-defined opacity within the left lung base, which is new from the prior chest radiographs of 07/31/2021 and may reflect atelectasis and/or pneumonia. Radiographic follow-up to resolution is recommended. Bibasilar pulmonary scarring. Known pulmonary nodules, better appreciated on the  prior chest CT of 08/21/2021. Please refer to this prior report for further description and for follow-up recommendations. Aortic Atherosclerosis (ICD10-I70.0). Electronically Signed   By: Kellie Simmering D.O.   On: 11/26/2021 17:02    EKG: Independently reviewed. Sinus rhythm.   Assessment/Plan   1. Pneumonia; COVID-19  - Presents with worsening productive cough and SOB after recent COVID-19 diagnosis and despite outpatient treatment of COPD exacerbation  - Presentation and CXR concerning for secondary bacterial PNA and Rocephin and azithromycin were started in ED  - He is not hypoxic, but is elderly with comorbidity; sxs began 6-8 days ago   - Continue isolation, culture sputum, check strep pneumo and legionella antigens, continue Rocephin and azithromycin, start 3-day course remdesivir, trend markers   2. COPD  - Not in exacerbation  - Continue ICS-LAMA-LABA and as-needed albuterol    3. HTN  - Continue Norvasc, hold HCTZ initially (appears hypovolemic)   4. CKD IIIa  - SCr is 1.37 on admission, up from 1.18 a yr ago  - Renally-dose medications, monitor    DVT prophylaxis: Lovenox  Code Status: Full  Level of Care: Level of care: Med-Surg Family Communication: None present   Disposition Plan:  Patient is from: home  Anticipated d/c is to: TBD Anticipated d/c date is: 11/29/21  Patient currently: Pending stable/improved respiratory status, transition to oral medications  Consults called: non e Admission status: Inpatient; he is on rm air but is very symptomatic with minimal exertion and has high PORT score.    Vianne Bulls, MD [facility]  11/26/2021, 8:12 PM

## 2021-11-26 NOTE — ED Provider Notes (Signed)
[facility] Provider Note   CSN: [id number] Arrival date & time: 11/26/21  1447     History  Chief Complaint  Patient presents with   Weakness    Brandon Sanford is a 81 y.o. male.   Weakness    Patient with medical history of COPD, hypertension, CKD 3A and recent COVID diagnosis presenting to emergency department due to shortness of breath and cough.  This started about a week ago he was diagnosed with COVID and had a suspected COPD exacerbation.  States he was treated with prednisone but did not improve.  Seen in urgent care prior to arrival, was hypoxic and ill-appearing sent to ED for additional evaluation.  He has been having a productive cough, feeling increasingly short of breath.  Home Medications Prior to Admission medications   Medication Sig Start Date End Date Taking? Authorizing Provider  albuterol (VENTOLIN HFA) 108 (90 Base) MCG/ACT inhaler Inhale 2 puffs into the lungs every 6 (six) hours as needed for wheezing or shortness of breath. 01/24/21  Yes Tanda Rockers, MD  amLODipine (NORVASC) 5 MG tablet Take 5 mg by mouth daily.   Yes [provider]  Budeson-Glycopyrrol-Formoterol (BREZTRI AEROSPHERE) 160-9-4.8 MCG/ACT AERO Inhale 2 puffs into the lungs in the morning and at bedtime. 06/13/21  Yes Tanda Rockers, MD  hydrochlorothiazide (MICROZIDE) 12.5 MG capsule Take 12.5 mg by mouth daily.   Yes [provider]  levothyroxine (SYNTHROID, LEVOTHROID) 100 MCG tablet Take 100 mcg by mouth daily before breakfast.   Yes [provider]  Multiple Vitamin (MULTIVITAMIN) tablet Take 1 tablet by mouth daily.   Yes [provider]  Polyethyl Glycol-Propyl Glycol 0.4-0.3 % SOLN Place 1-2 drops into both eyes 3 (three) times daily as needed (dry eyes/irritated eyes).   Yes [provider]  simvastatin (ZOCOR) 40 MG tablet Take 40 mg by mouth every evening.    Yes [provider]  TUMS 500  MG chewable tablet Chew 500 mg by mouth 2 (two) times daily as needed for indigestion or heartburn. 04/19/17  Yes [provider]  azithromycin (ZITHROMAX) 250 MG tablet Take 1 tablet (250 mg total) by mouth daily. Take first 2 tablets together, then 1 every day until finished. 11/21/21   Francene Finders, PA-C  predniSONE (DELTASONE) 20 MG tablet Take 2 tablets (40 mg total) by mouth daily with breakfast for 5 days. 11/21/21 11/26/21  Francene Finders, PA-C      Allergies    Lisinopril, Chlorhexidine, and Pneumococcal vaccines    Review of Systems   Review of Systems  Neurological:  Positive for weakness.    Physical Exam Updated Vital Signs BP 116/74 (BP Location: Right Arm)   Pulse 68   Temp 98.3 F (36.8 C) (Oral)   Resp 16   Ht '5\' 9"'$  (1.753 m)   Wt 77.8 kg   SpO2 94%   BMI 25.33 kg/m  Physical Exam Vitals and nursing note reviewed. Exam conducted with a chaperone present.  Constitutional:      Appearance: Normal appearance. He is ill-appearing and diaphoretic.  HENT:     Head: Normocephalic and atraumatic.  Eyes:     General: No scleral icterus.       Right eye: No discharge.        Left eye: No discharge.     Extraocular Movements: Extraocular movements intact.     Pupils: Pupils are equal, round, and reactive to light.  Cardiovascular:     Rate and Rhythm: Normal rate and regular rhythm.     Pulses: Normal pulses.     Heart sounds: Normal heart sounds. No murmur heard.    No friction rub. No gallop.  Pulmonary:     Breath sounds: Normal breath sounds.     Comments: Slightly tachypneic but no wheezing. Abdominal:     General: Abdomen is flat. Bowel sounds are normal. There is no distension.     Palpations: Abdomen is soft.     Tenderness: There is no abdominal tenderness.  Skin:    General: Skin is warm.     Coloration: Skin is not jaundiced.  Neurological:     Mental Status: He is alert. Mental status is at baseline.     Coordination: Coordination  normal.     ED Results / Procedures / Treatments   Labs (all labs ordered are listed, but only abnormal results are displayed) Labs Reviewed  COMPREHENSIVE METABOLIC PANEL - Abnormal; Notable for the following components:      Result Value   Glucose, Bld 105 (*)    BUN 25 (*)    Creatinine, Ser 1.37 (*)    Total Protein 6.3 (*)    GFR, Estimated 52 (*)    All other components within normal limits  EXPECTORATED SPUTUM ASSESSMENT W GRAM STAIN, RFLX TO RESP C  CBC WITH DIFFERENTIAL/PLATELET  URINALYSIS, ROUTINE W REFLEX MICROSCOPIC  LEGIONELLA PNEUMOPHILA SEROGP 1 UR AG  STREP PNEUMONIAE URINARY ANTIGEN  COMPREHENSIVE METABOLIC PANEL  CBC  MAGNESIUM  D-DIMER, QUANTITATIVE  LACTATE DEHYDROGENASE  PROCALCITONIN  FERRITIN  C-REACTIVE PROTEIN  BRAIN NATRIURETIC PEPTIDE  C-REACTIVE PROTEIN  D-DIMER, QUANTITATIVE  FERRITIN  PROCALCITONIN    EKG None  Radiology DG Chest 2 View  Result Date: 11/26/2021 CLINICAL DATA:  Provided history: Cough, COVID + EXAM: CHEST - 2 VIEW COMPARISON:  Prior chest radiographs 07/31/2021 and earlier. Chest CT 08/21/2021 FINDINGS: Heart size within normal limits. Aortic atherosclerosis. Bibasilar pulmonary scarring. Superimposed ill-defined opacity within the left lung base, which is new from the prior chest radiographs of 07/31/2021 and may reflect atelectasis and/or pneumonia. No appreciable airspace consolidation on the right. Known pulmonary nodules, better appreciated on the prior chest CT of 08/21/2021. No evidence of pleural effusion or pneumothorax. Degenerative changes of the spine. IMPRESSION: Ill-defined opacity within the left lung base, which is new from the prior chest radiographs of 07/31/2021 and may reflect atelectasis and/or pneumonia. Radiographic follow-up to resolution is recommended. Bibasilar pulmonary scarring. Known pulmonary nodules, better appreciated on the prior chest CT of 08/21/2021. Please refer to this prior report for  further description and for follow-up recommendations. Aortic Atherosclerosis (ICD10-I70.0). Electronically Signed   By: Kellie Simmering D.O.   On: 11/26/2021 17:02    Procedures Procedures    Medications Ordered in ED Medications  azithromycin (ZITHROMAX) 500 mg in sodium chloride 0.9 % 250 mL IVPB (has no administration in time range)  amLODipine (NORVASC) tablet 5 mg (has no administration in time range)  simvastatin (ZOCOR) tablet 40 mg (has no administration in time range)  levothyroxine (SYNTHROID) tablet 100 mcg (has no administration in time range)  albuterol (VENTOLIN HFA) 108 (90 Base) MCG/ACT inhaler 2 puff (has no administration in time range)  Budeson-Glycopyrrol-Formoterol 160-9-4.8 MCG/ACT AERO 2 puff (has no administration in time range)  enoxaparin (LOVENOX) injection 40 mg (has no administration in time range)  cefTRIAXone (ROCEPHIN) 2 g in sodium chloride 0.9 % 100 mL IVPB (has no administration in  time range)  azithromycin (ZITHROMAX) tablet 500 mg (has no administration in time range)  acetaminophen (TYLENOL) tablet 650 mg (has no administration in time range)    Or  acetaminophen (TYLENOL) suppository 650 mg (has no administration in time range)  senna-docusate (Senokot-S) tablet 1 tablet (has no administration in time range)  ondansetron (ZOFRAN) tablet 4 mg (has no administration in time range)    Or  ondansetron (ZOFRAN) injection 4 mg (has no administration in time range)  remdesivir 200 mg in sodium chloride 0.9% 250 mL IVPB (has no administration in time range)    Followed by  remdesivir 100 mg in sodium chloride 0.9 % 100 mL IVPB (has no administration in time range)  guaiFENesin-dextromethorphan (ROBITUSSIN DM) 100-10 MG/5ML syrup 10 mL (has no administration in time range)  chlorpheniramine-HYDROcodone (TUSSIONEX) 10-8 MG/5ML suspension 5 mL (has no administration in time range)  cefTRIAXone (ROCEPHIN) 1 g in sodium chloride 0.9 % 100 mL IVPB (1 g Intravenous  New Bag/Given 11/26/21 2006)    ED Course/ Medical Decision Making/ A&P                           Medical Decision Making Risk Decision regarding hospitalization.   Patient presents due to worsening cough and shortness of breath.  Differential includes but not limited to pneumonia, COPD exacerbation, PE, atypical ACS, viral infections.  On exam patient appears uncomfortable.  His upper and lower extremity pulses are symmetric, his lungs are clear to auscultation.  He is satting about 94% on room air.  I ordered, viewed and interpreted laboratory work-up. No leukocytosis or anemia. No gross electrolyte derangement.  Patient is a mild AKI with a creatinine 1.37.  Chest x-ray is notable for left lower lobe pneumonia.  Agree with radiologist interpretation.  EKG shows sinus rhythm.  Patient is on cardiac monitoring in sinus rhythm per my interpretation.  I consider send the patient home.  We attempted to ambulate with pulse ox although patient was satting in 95 to 98% range she was very symptomatic, very short of breath and had to sit down.  I am concerned about worsening pneumonia.  I ordered Rocephin and azithromycin.  I will consult the hospitalist for admission.        Final Clinical Impression(s) / ED Diagnoses Final diagnoses:  Pneumonia of left lower lobe due to infectious organism    Rx / DC Orders ED Discharge Orders     None         Sherrill Raring, Hershal Coria 11/26/21 2134    Milton Ferguson, MD 11/28/21 [phone]

## 2021-11-27 DIAGNOSIS — J189 Pneumonia, unspecified organism: Secondary | ICD-10-CM | POA: Diagnosis not present

## 2021-11-27 LAB — COMPREHENSIVE METABOLIC PANEL
ALT: 18 U/L (ref 0–44)
AST: 18 U/L (ref 15–41)
Albumin: 3.2 g/dL — ABNORMAL LOW (ref 3.5–5.0)
Alkaline Phosphatase: 49 U/L (ref 38–126)
Anion gap: 8 (ref 5–15)
BUN: 21 mg/dL (ref 8–23)
CO2: 24 mmol/L (ref 22–32)
Calcium: 8.7 mg/dL — ABNORMAL LOW (ref 8.9–10.3)
Chloride: 103 mmol/L (ref 98–111)
Creatinine, Ser: 1.23 mg/dL (ref 0.61–1.24)
GFR, Estimated: 59 mL/min — ABNORMAL LOW (ref >60)
Glucose, Bld: 107 mg/dL — ABNORMAL HIGH (ref 70–99)
Potassium: 3.7 mmol/L (ref 3.5–5.1)
Sodium: 135 mmol/L (ref 135–145)
Total Bilirubin: 0.5 mg/dL (ref 0.3–1.2)
Total Protein: 5.7 g/dL — ABNORMAL LOW (ref 6.5–8.1)

## 2021-11-27 LAB — CBC
HCT: 39.6 % (ref 39.0–52.0)
Hemoglobin: 13.2 g/dL (ref 13.0–17.0)
MCH: 29.7 pg (ref 26.0–34.0)
MCHC: 33.3 g/dL (ref 30.0–36.0)
MCV: 89 fL (ref 80.0–100.0)
Platelets: 153 10*3/uL (ref 150–400)
RBC: 4.45 MIL/uL (ref 4.22–5.81)
RDW: 12.2 % (ref 11.5–15.5)
WBC: 6.4 10*3/uL (ref 4.0–10.5)
nRBC: 0 % (ref 0.0–0.2)

## 2021-11-27 LAB — C-REACTIVE PROTEIN
CRP: 7.3 mg/dL — ABNORMAL HIGH (ref <1.0)
CRP: 10.9 mg/dL — ABNORMAL HIGH (ref <1.0)

## 2021-11-27 LAB — MAGNESIUM: Magnesium: 2 mg/dL (ref 1.7–2.4)

## 2021-11-27 LAB — D-DIMER, QUANTITATIVE: D-Dimer, Quant: 0.43 ug/mL-FEU (ref 0.00–0.50)

## 2021-11-27 LAB — STREP PNEUMONIAE URINARY ANTIGEN: Strep Pneumo Urinary Antigen: NEGATIVE

## 2021-11-27 LAB — PROCALCITONIN: Procalcitonin: <0.1 ng/mL

## 2021-11-27 LAB — FERRITIN: Ferritin: 311 ng/mL (ref 24–336)

## 2021-11-27 MED ORDER — GUAIFENESIN-DM 100-10 MG/5ML PO SYRP
10.0000 mL | ORAL_SOLUTION | Freq: Three times a day (TID) | ORAL | Status: DC
  Administered 2021-11-27 – 2021-11-28 (×3): 10 mL via ORAL
  Filled 2021-11-27 (×3): qty 10

## 2021-11-27 MED ORDER — PREDNISONE 20 MG PO TABS
40.0000 mg | ORAL_TABLET | Freq: Every day | ORAL | Status: DC
  Administered 2021-11-27 – 2021-11-28 (×2): 40 mg via ORAL
  Filled 2021-11-27 (×2): qty 2

## 2021-11-27 MED ORDER — HYDROCOD POLI-CHLORPHE POLI ER 10-8 MG/5ML PO SUER
5.0000 mL | Freq: Two times a day (BID) | ORAL | Status: DC
  Administered 2021-11-27 – 2021-11-28 (×3): 5 mL via ORAL
  Filled 2021-11-27 (×3): qty 5

## 2021-11-27 NOTE — Hospital Course (Signed)
Brandon Sanford is a 81 y.o. male with medical history significant for COPD, hypertension, hypothyroidism, CKD 3A, and recent COVID-79 diagnosis who presents emergency department with fevers and worsening cough and shortness of breath.   Symptoms began on 11/19/2021 he was seen at an urgent care on 11/21/2021 where he was started on a prednisone and azithromycin for COPD exacerbation, found to have negative influenza but positive COVID-19 PCR.  He has not been on any specific treatment for COVID.  He began to worsen significantly yesterday with increased cough and dyspnea.  He returned to urgent care where his oxygen saturation was as low as 91%, and this prompted his transfer to the emergency department. His cough has been productive. He denies chest pain, hemoptysis, or leg swelling.     ED Course: Upon arrival to the ED, patient is found to be afebrile and saturating mid 90s on room air with ambulation but tachypneic and severely fatigued.  Chest x-ray demonstrates an ill-defined opacity in the left base.  Chemistry panel notable for creatinine of 1.37.  CBC unremarkable.  He was treated with Rocephin and azithromycin in the ED.   Assessment/Plan    1. Pneumonia; COVID-19  - Presents with worsening productive cough and SOB after recent COVID-19 diagnosis and despite outpatient treatment of COPD exacerbation  - Presentation and CXR concerning for secondary bacterial PNA and Rocephin and azithromycin were started in ED  - He is not hypoxic, but is elderly with comorbidity; sxs began 6-8 days ago   - Continue isolation, culture sputum, check strep pneumo and legionella antigens, continue Rocephin and azithromycin, start 3-day course remdesivir, trend markers    2. COPD  - Not in exacerbation  - Continue ICS-LAMA-LABA and as-needed albuterol     3. HTN  - Continue Norvasc, hold HCTZ initially (appears hypovolemic)    4. CKD IIIa  - SCr is 1.37 on admission, up from 1.18 a yr ago  - Renally-dose  medications, monitor

## 2021-11-27 NOTE — Progress Notes (Signed)
PROGRESS NOTE    Patient: Brandon Sanford                            PCP: Alroy Dust, L.Marlou Sa, MD                    DOB: 1940/08/15            DOA: 11/26/2021 UXN:[id number]             DOS: 11/27/2021, 12:49 PM   LOS: 1 day   Date of Service: The patient was seen and examined on 11/27/2021  Subjective:   The patient was seen and examined this morning. Hemodynamically stable, continue complain of shortness of breath, cough Complaining of restlessness and insomnia.  Brief Narrative:   Brandon Sanford is a 81 y.o. male with medical history significant for COPD, hypertension, hypothyroidism, CKD 3A, and recent COVID-53 diagnosis who presents emergency department with fevers and worsening cough and shortness of breath.   Symptoms began on 11/19/2021 he was seen at an urgent care on 11/21/2021 where he was started on a prednisone and azithromycin for COPD exacerbation, found to have negative influenza but positive COVID-19 PCR.  He has not been on any specific treatment for COVID.  He began to worsen significantly yesterday with increased cough and dyspnea.  He returned to urgent care where his oxygen saturation was as low as 91%, and this prompted his transfer to the emergency department. His cough has been productive. He denies chest pain, hemoptysis, or leg swelling.     ED Course: Upon arrival to the ED, patient is found to be afebrile and saturating mid 90s on room air with ambulation but tachypneic and severely fatigued.  Chest x-ray demonstrates an ill-defined opacity in the left base.  Chemistry panel notable for creatinine of 1.37.  CBC unremarkable.  He was treated with Rocephin and azithromycin in the ED.    Assessment & Plan:     Assessment/Plan   Principal Problem:   Pneumonia Active Problems:   Hypothyroidism   Essential hypertension   COPD  GOLD II   Stage 3a chronic kidney disease (CKD) ([place])   COVID-19 virus infection   1. Pneumonia; COVID-61  - Presents with worsening  productive cough and SOB after recent COVID-19 diagnosis and despite outpatient treatment of COPD exacerbation  - Presentation and CXR concerning for secondary bacterial PNA  -Hypoxia, satting 92-94% on room air -Continue short dose of oral steroids, -Due to complaint of persistent cough, hypoxia, but is elderly with comorbidity; sxs began 6-8 days ago   - Continue isolation, culture sputum, check strep pneumo and legionella antigens,  -We will continue Rocephin and azithromycin,  -Planning for 3-day course remdesivir, trend markers    2. COPD  -No excessive exacerbation aside from cough..  Room air satting 94% now - Continue ICS-LAMA-LABA and as-needed albuterol   -Continue DuoNeb bronchodilators -Dose of steroids   3. HTN  - Continue Norvasc, hold HCTZ initially (appears hypovolemic)    4. CKD IIIa  - SCr is 1.37 on admission, up from 1.18 a yr ago  >>> 1.23 today - Renally-dose medications, monitor     ---------------------------------------------------------------------------------------------------------------------------------------------------- Cultures; Blood Cultures x 2 >> NGT Sputum Culture >> NGT  ------------------------------------------------------------------------------------------------------------------------------------------------  DVT prophylaxis:  enoxaparin (LOVENOX) injection 40 mg Start: 11/26/21 2200   Code Status:   Code Status: Full Code  Family Communication: No family member present at bedside- attempt will be  made to update daily The above findings and plan of care has been discussed with patient (and family)  in detail,  they expressed understanding and agreement of above. -Advance care planning has been discussed.   Admission status:   Status is: Inpatient Remains inpatient appropriate because: Needing IV fluids, IV antibiotics treatment for COVID, IV remdesivir     Procedures:   No admission procedures for hospital  encounter.   Antimicrobials:  Anti-infectives (From admission, onward)    Start     Dose/Rate Route Frequency Ordered Stop   11/27/21 2200  azithromycin (ZITHROMAX) tablet 500 mg        500 mg Oral Daily at bedtime 11/26/21 2012 12/01/21 2159   11/27/21 2000  cefTRIAXone (ROCEPHIN) 2 g in sodium chloride 0.9 % 100 mL IVPB        2 g 200 mL/hr over 30 Minutes Intravenous Every 24 hours 11/26/21 2012 12/01/21 1959   11/27/21 1000  remdesivir 100 mg in sodium chloride 0.9 % 100 mL IVPB       See Hyperspace for full Linked Orders Report.   100 mg 200 mL/hr over 30 Minutes Intravenous Daily 11/26/21 2012 11/29/21 0959   11/26/21 2200  remdesivir 200 mg in sodium chloride 0.9% 250 mL IVPB       See Hyperspace for full Linked Orders Report.   200 mg 580 mL/hr over 30 Minutes Intravenous Once 11/26/21 2012 11/27/21 0037   11/26/21 1945  cefTRIAXone (ROCEPHIN) 1 g in sodium chloride 0.9 % 100 mL IVPB        1 g 200 mL/hr over 30 Minutes Intravenous  Once 11/26/21 1937 11/26/21 2036   11/26/21 1945  azithromycin (ZITHROMAX) 500 mg in sodium chloride 0.9 % 250 mL IVPB        500 mg 250 mL/hr over 60 Minutes Intravenous  Once 11/26/21 1937 11/26/21 2355        Medication:   amLODipine  5 mg Oral Daily   azithromycin  500 mg Oral QHS   chlorpheniramine-HYDROcodone  5 mL Oral Q12H   enoxaparin (LOVENOX) injection  40 mg Subcutaneous Q24H   guaiFENesin-dextromethorphan  10 mL Oral Q8H   levothyroxine  100 mcg Oral QAC breakfast   mometasone-formoterol  2 puff Inhalation BID   And   umeclidinium bromide  1 puff Inhalation Daily   predniSONE  40 mg Oral Q breakfast   simvastatin  40 mg Oral q1800    acetaminophen **OR** acetaminophen, albuterol, melatonin, ondansetron **OR** ondansetron (ZOFRAN) IV, senna-docusate   Objective:   Vitals:   11/27/21 0437 11/27/21 0500 11/27/21 0852 11/27/21 1206  BP: 134/72  (!) 140/75 130/64  Pulse: 74  70 69  Resp: 19   18  Temp: 98.7 F (37.1  C)   97.9 F (36.6 C)  TempSrc:      SpO2: 95%  94% 94%  Weight:  77.8 kg    Height:        Intake/Output Summary (Last 24 hours) at 11/27/2021 1249 Last data filed at 11/27/2021 1200 Gross per 24 hour  Intake 460 ml  Output --  Net 460 ml   Filed Weights   11/26/21 2046 11/27/21 0500  Weight: 77.8 kg 77.8 kg     Examination:   Physical Exam  Constitution:  Alert, cooperative, no distress,  Appears calm and comfortable  Psychiatric:   Normal and stable mood and affect, cognition intact,   HEENT:        Normocephalic, PERRL, otherwise with  in Normal limits  Chest:         Chest symmetric Cardio vascular:  S1/S2, RRR, No murmure, No Rubs or Gallops  pulmonary: Clear to auscultation bilaterally, respirations unlabored, negative wheezes / crackles Abdomen: Soft, non-tender, non-distended, bowel sounds,no masses, no organomegaly Muscular skeletal: Limited exam - in bed, able to move all 4 extremities,   Neuro: CNII-XII intact. , normal motor and sensation, reflexes intact  Extremities: No pitting edema lower extremities, +2 pulses  Skin: Dry, warm to touch, negative for any Rashes, No open wounds Wounds: per nursing documentation   ------------------------------------------------------------------------------------------------------------------------------------------    LABs:     Latest Ref Rng & Units 11/27/2021    7:06 AM 11/26/2021    4:49 PM 09/07/2020    2:28 PM  CBC  WBC 4.0 - 10.5 K/uL 6.4  8.3  6.9   Hemoglobin 13.0 - 17.0 g/dL 13.2  14.3  11.0   Hematocrit 39.0 - 52.0 % 39.6  43.1  33.8   Platelets 150 - 400 K/uL 153  178  402.0       Latest Ref Rng & Units 11/27/2021    7:06 AM 11/26/2021    4:49 PM 12/29/2020    3:26 PM  CMP  Glucose 70 - 99 mg/dL 107  105  106   BUN 8 - 23 mg/dL '21  25  19   '$ Creatinine 0.61 - 1.24 mg/dL 1.23  1.37  1.18   Sodium 135 - 145 mmol/L 135  136  139   Potassium 3.5 - 5.1 mmol/L 3.7  3.7  3.8   Chloride 98 - 111 mmol/L 103   102  102   CO2 22 - 32 mmol/L '24  25  27   '$ Calcium 8.9 - 10.3 mg/dL 8.7  9.0  10.4   Total Protein 6.5 - 8.1 g/dL 5.7  6.3    Total Bilirubin 0.3 - 1.2 mg/dL 0.5  0.7    Alkaline Phos 38 - 126 U/L 49  53    AST 15 - 41 U/L 18  22    ALT 0 - 44 U/L 18  22         Micro Results Recent Results (from the past 240 hour(s))  Resp Panel by RT-PCR (Flu A&B, Covid) Anterior Nasal Swab     Status: Abnormal   Collection Time: 11/21/21  9:19 AM   Specimen: Anterior Nasal Swab  Result Value Ref Range Status   SARS Coronavirus 2 by RT PCR POSITIVE (A) NEGATIVE Final    Comment: (NOTE) SARS-CoV-2 target nucleic acids are DETECTED.  The SARS-CoV-2 RNA is generally detectable in upper respiratory specimens during the acute phase of infection. Positive results are indicative of the presence of the identified virus, but do not rule out bacterial infection or co-infection with other pathogens not detected by the test. Clinical correlation with patient history and other diagnostic information is necessary to determine patient infection status. The expected result is Negative.  Fact Sheet for Patients: EntrepreneurPulse.com.au  Fact Sheet for Healthcare Providers: IncredibleEmployment.be  This test is not yet approved or cleared by the [place] FDA and  has been authorized for detection and/or diagnosis of SARS-CoV-2 by FDA under an Emergency Use Authorization (EUA).  This EUA will remain in effect (meaning this test can be used) for the duration of  the COVID-19 declaration under Section 564(b)(1) of the A ct, 21 U.S.C. section 360bbb-3(b)(1), unless the authorization is terminated or revoked sooner.     Influenza A  by PCR NEGATIVE NEGATIVE Final   Influenza B by PCR NEGATIVE NEGATIVE Final    Comment: (NOTE) The Xpert Xpress SARS-CoV-2/FLU/RSV plus assay is intended as an aid in the diagnosis of influenza from Nasopharyngeal swab specimens  and should not be used as a sole basis for treatment. Nasal washings and aspirates are unacceptable for Xpert Xpress SARS-CoV-2/FLU/RSV testing.  Fact Sheet for Patients: EntrepreneurPulse.com.au  Fact Sheet for Healthcare Providers: IncredibleEmployment.be  This test is not yet approved or cleared by the [place] FDA and has been authorized for detection and/or diagnosis of SARS-CoV-2 by FDA under an Emergency Use Authorization (EUA). This EUA will remain in effect (meaning this test can be used) for the duration of the COVID-19 declaration under Section 564(b)(1) of the Act, 21 U.S.C. section 360bbb-3(b)(1), unless the authorization is terminated or revoked.  Performed at [facility], [place] [address], [place], [place] [address]     Radiology Reports DG Chest 2 View  Result Date: 11/26/2021 CLINICAL DATA:  Provided history: Cough, COVID + EXAM: CHEST - 2 VIEW COMPARISON:  Prior chest radiographs 07/31/2021 and earlier. Chest CT 08/21/2021 FINDINGS: Heart size within normal limits. Aortic atherosclerosis. Bibasilar pulmonary scarring. Superimposed ill-defined opacity within the left lung base, which is new from the prior chest radiographs of 07/31/2021 and may reflect atelectasis and/or pneumonia. No appreciable airspace consolidation on the right. Known pulmonary nodules, better appreciated on the prior chest CT of 08/21/2021. No evidence of pleural effusion or pneumothorax. Degenerative changes of the spine. IMPRESSION: Ill-defined opacity within the left lung base, which is new from the prior chest radiographs of 07/31/2021 and may reflect atelectasis and/or pneumonia. Radiographic follow-up to resolution is recommended. Bibasilar pulmonary scarring. Known pulmonary nodules, better appreciated on the prior chest CT of 08/21/2021. Please refer to this prior report for further description and for follow-up recommendations. Aortic  Atherosclerosis (ICD10-I70.0). Electronically Signed   By: Kellie Simmering D.O.   On: 11/26/2021 17:02    SIGNED: Deatra James, MD, FHM. [facility],  Pager (please use [email or web address] to page/text) Please use Epic Secure Chat for non-urgent communication (7AM-7PM)  If 7PM-7AM, please contact night-coverage [email or web address], 11/27/2021, 12:49 PM

## 2021-11-27 NOTE — Plan of Care (Signed)
  Problem: Activity: Goal: Ability to tolerate increased activity will improve Outcome: Progressing   Problem: Clinical Measurements: Goal: Ability to maintain a body temperature in the normal range will improve Outcome: Progressing   Problem: Respiratory: Goal: Ability to maintain adequate ventilation will improve Outcome: Progressing Goal: Ability to maintain a clear airway will improve Outcome: Progressing   Problem: Education: Goal: Knowledge of risk factors and measures for prevention of condition will improve Outcome: Progressing   Problem: Coping: Goal: Psychosocial and spiritual needs will be supported Outcome: Progressing   Problem: Respiratory: Goal: Will maintain a patent airway Outcome: Progressing Goal: Complications related to the disease process, condition or treatment will be avoided or minimized Outcome: Progressing   Problem: Education: Goal: Knowledge of General Education information will improve Description: Including pain rating scale, medication(s)/side effects and non-pharmacologic comfort measures Outcome: Progressing   Problem: Health Behavior/Discharge Planning: Goal: Ability to manage health-related needs will improve Outcome: Progressing   Problem: Clinical Measurements: Goal: Ability to maintain clinical measurements within normal limits will improve Outcome: Progressing Goal: Will remain free from infection Outcome: Progressing Goal: Diagnostic test results will improve Outcome: Progressing Goal: Respiratory complications will improve Outcome: Progressing Goal: Cardiovascular complication will be avoided Outcome: Progressing   Problem: Activity: Goal: Risk for activity intolerance will decrease Outcome: Progressing   Problem: Nutrition: Goal: Adequate nutrition will be maintained Outcome: Progressing   Problem: Coping: Goal: Level of anxiety will decrease Outcome: Progressing   Problem: Elimination: Goal: Will not experience  complications related to bowel motility Outcome: Progressing Goal: Will not experience complications related to urinary retention Outcome: Progressing   Problem: Pain Managment: Goal: General experience of comfort will improve Outcome: Progressing   Problem: Safety: Goal: Ability to remain free from injury will improve Outcome: Progressing   Problem: Skin Integrity: Goal: Risk for impaired skin integrity will decrease Outcome: Progressing

## 2021-11-27 NOTE — Progress Notes (Signed)
  Transition of Care [facility]) Screening Note   Patient Details  Name: Brandon Sanford Date of Birth: [birth year]   Transition of Care [facility]) CM/SW Contact:    Vassie Moselle, LCSW Phone Number: 11/27/2021, 1:43 PM    Transition of Care Department [facility]) has reviewed patient and no TOC needs have been identified at this time. We will continue to monitor patient advancement through interdisciplinary progression rounds. If new patient transition needs arise, please place a TOC consult.

## 2021-11-27 NOTE — Plan of Care (Signed)
  Problem: Activity: Goal: Ability to tolerate increased activity will improve Outcome: Progressing   Problem: Clinical Measurements: Goal: Ability to maintain a body temperature in the normal range will improve Outcome: Progressing   

## 2021-11-28 DIAGNOSIS — U071 COVID-19: Secondary | ICD-10-CM | POA: Diagnosis not present

## 2021-11-28 LAB — COMPREHENSIVE METABOLIC PANEL
ALT: 16 U/L (ref 0–44)
AST: 16 U/L (ref 15–41)
Albumin: 2.9 g/dL — ABNORMAL LOW (ref 3.5–5.0)
Alkaline Phosphatase: 45 U/L (ref 38–126)
Anion gap: 8 (ref 5–15)
BUN: 25 mg/dL — ABNORMAL HIGH (ref 8–23)
CO2: 23 mmol/L (ref 22–32)
Calcium: 8.9 mg/dL (ref 8.9–10.3)
Chloride: 106 mmol/L (ref 98–111)
Creatinine, Ser: 1.15 mg/dL (ref 0.61–1.24)
GFR, Estimated: >60 mL/min (ref >60)
Glucose, Bld: 152 mg/dL — ABNORMAL HIGH (ref 70–99)
Potassium: 3.6 mmol/L (ref 3.5–5.1)
Sodium: 137 mmol/L (ref 135–145)
Total Bilirubin: 0.5 mg/dL (ref 0.3–1.2)
Total Protein: 5.2 g/dL — ABNORMAL LOW (ref 6.5–8.1)

## 2021-11-28 LAB — CBC
HCT: 36.9 % — ABNORMAL LOW (ref 39.0–52.0)
Hemoglobin: 12.4 g/dL — ABNORMAL LOW (ref 13.0–17.0)
MCH: 29.8 pg (ref 26.0–34.0)
MCHC: 33.6 g/dL (ref 30.0–36.0)
MCV: 88.7 fL (ref 80.0–100.0)
Platelets: 164 10*3/uL (ref 150–400)
RBC: 4.16 MIL/uL — ABNORMAL LOW (ref 4.22–5.81)
RDW: 12 % (ref 11.5–15.5)
WBC: 4.2 10*3/uL (ref 4.0–10.5)
nRBC: 0 % (ref 0.0–0.2)

## 2021-11-28 LAB — D-DIMER, QUANTITATIVE: D-Dimer, Quant: 0.41 ug/mL-FEU (ref 0.00–0.50)

## 2021-11-28 LAB — PROCALCITONIN: Procalcitonin: <0.1 ng/mL

## 2021-11-28 LAB — C-REACTIVE PROTEIN: CRP: 8.5 mg/dL — ABNORMAL HIGH (ref <1.0)

## 2021-11-28 LAB — FERRITIN: Ferritin: 256 ng/mL (ref 24–336)

## 2021-11-28 MED ORDER — PREDNISONE 20 MG PO TABS: 20.0000 mg | ORAL_TABLET | Freq: Every day | ORAL | 0 refills | Status: AC

## 2021-11-28 MED ORDER — ALUM & MAG HYDROXIDE-SIMETH 200-200-20 MG/5ML PO SUSP
15.0000 mL | Freq: Once | ORAL | Status: AC
  Administered 2021-11-28: 15 mL via ORAL
  Filled 2021-11-28: qty 30

## 2021-11-28 MED ORDER — AZITHROMYCIN 250 MG PO TABS
500.0000 mg | ORAL_TABLET | Freq: Once | ORAL | Status: AC
  Administered 2021-11-28: 500 mg via ORAL
  Filled 2021-11-28: qty 2

## 2021-11-28 MED ORDER — MELATONIN 3 MG PO TABS: 3.0000 mg | ORAL_TABLET | Freq: Every evening | ORAL | 0 refills | Status: AC | PRN

## 2021-11-28 MED ORDER — SODIUM CHLORIDE 0.9 % IV SOLN
2.0000 g | Freq: Once | INTRAVENOUS | Status: AC
  Administered 2021-11-28: 2 g via INTRAVENOUS
  Filled 2021-11-28: qty 20

## 2021-11-28 MED ORDER — GUAIFENESIN-DM 100-10 MG/5ML PO SYRP: 10.0000 mL | ORAL_SOLUTION | Freq: Three times a day (TID) | ORAL | 0 refills | Status: DC

## 2021-11-28 MED ORDER — AMOXICILLIN-POT CLAVULANATE 875-125 MG PO TABS: 1.0000 | ORAL_TABLET | Freq: Two times a day (BID) | ORAL | 0 refills | Status: AC

## 2021-11-28 NOTE — Discharge Summary (Signed)
Physician Discharge Summary  Brandon Sanford:[id number] DOB: [birth year] DOA: 11/26/2021  PCP: Alroy Dust, L.Marlou Sa, MD  Admit date: 11/26/2021 Discharge date: 11/28/2021  Admitted From: Home Disposition: Home  Recommendations for Outpatient Follow-up:  Follow up with PCP in 1-2 weeks Please obtain BMP/CBC in one week Please follow up on the following pending results:  Home Health: N/A Equipment/Devices: N/A  Discharge Condition: Stable CODE STATUS: Full code Diet recommendation: Low-salt diet  Discharge summary: 81 year old with history of COPD, hypertension, hypothyroidism, CKD stage IIIa and recent COVID-19 infection about 10 days ago presented back to the emergency room with fever and worsening cough and shortness of breath.  He was diagnosed with COVID-19 infection on 11/1 and is started on prednisone and azithromycin treated for COPD exacerbation, he had worsening cough and shortness of breath so sent to emergency room from urgent care.  In the emergency room he was afebrile, 90% on room air with ambulation but tachypneic and fatigued.  Chest x-ray with left lower lobe opacity.  Patient was started on remdesivir, Rocephin azithromycin and admitted to the hospital.  Pneumonia due to COVID-19 virus: Patient treated with chest physiotherapy, incentive symmetry, mobility.   Remains afebrile while in the hospital. Patient was given remdesivir though he was more than 7 days symptomatic, completed 3 days of therapy.  Cultures are negative. Patient received prednisone and was continued in the hospital. Less likely bacterial infection.  Plan: With good clinical recovery and symptom control today, we will continue prednisone 20 mg additional 3 days to complete total 10 days of steroid therapy.  4 more days of Augmentin therapy. 2 weeks of isolation if in the hospital, 5 days of isolation at home. Over-the-counter cough medications and other therapies. Patient is on bronchodilator  therapy.  Other chronic medical issues Essential hypertension, remains a stable CKD stage IIIa, remains stable.    Discharge Diagnoses:  Principal Problem:   Pneumonia Active Problems:   Hypothyroidism   Essential hypertension   COPD  GOLD II   Stage 3a chronic kidney disease (CKD) ([place])   COVID-19 virus infection    Discharge Instructions  Discharge Instructions     Diet - low sodium heart healthy   Complete by: As directed    Increase activity slowly   Complete by: As directed       Allergies as of 11/28/2021       Reactions   Lisinopril Cough   Chlorhexidine Rash   Pre-surgery soap   Pneumococcal Vaccines Rash        Medication List     STOP taking these medications    azithromycin 250 MG tablet Commonly known as: ZITHROMAX       TAKE these medications    acetaminophen 160 MG/5ML liquid Commonly known as: TYLENOL Take 325 mg by mouth every 4 (four) hours as needed for fever or pain.   albuterol 108 (90 Base) MCG/ACT inhaler Commonly known as: VENTOLIN HFA Inhale 2 puffs into the lungs every 6 (six) hours as needed for wheezing or shortness of breath.   amLODipine 5 MG tablet Commonly known as: NORVASC Take 5 mg by mouth daily.   amoxicillin-clavulanate 875-125 MG tablet Commonly known as: AUGMENTIN Take 1 tablet by mouth 2 (two) times daily for 4 days.   Breztri Aerosphere 160-9-4.8 MCG/ACT Aero Generic drug: Budeson-Glycopyrrol-Formoterol Inhale 2 puffs into the lungs in the morning and at bedtime.   guaiFENesin-dextromethorphan 100-10 MG/5ML syrup Commonly known as: ROBITUSSIN DM Take 10 mLs by mouth every 8 (eight)  hours.   hydrochlorothiazide 12.5 MG capsule Commonly known as: MICROZIDE Take 12.5 mg by mouth daily.   levothyroxine 100 MCG tablet Commonly known as: SYNTHROID Take 100 mcg by mouth daily before breakfast.   multivitamin tablet Take 1 tablet by mouth daily.   Polyethyl Glycol-Propyl Glycol 0.4-0.3 %  Soln Place 1-2 drops into both eyes 3 (three) times daily as needed (dry eyes/irritated eyes).   predniSONE 20 MG tablet Commonly known as: DELTASONE Take 1 tablet (20 mg total) by mouth daily for 3 days. What changed:  how much to take when to take this   simvastatin 40 MG tablet Commonly known as: ZOCOR Take 40 mg by mouth every evening.   Tums 500 MG chewable tablet Generic drug: calcium carbonate Chew 500 mg by mouth 2 (two) times daily as needed for indigestion or heartburn.        Allergies  Allergen Reactions   Lisinopril Cough   Chlorhexidine Rash    Pre-surgery soap   Pneumococcal Vaccines Rash    Consultations: None   Procedures/Studies: DG Chest 2 View  Result Date: 11/26/2021 CLINICAL DATA:  Provided history: Cough, COVID + EXAM: CHEST - 2 VIEW COMPARISON:  Prior chest radiographs 07/31/2021 and earlier. Chest CT 08/21/2021 FINDINGS: Heart size within normal limits. Aortic atherosclerosis. Bibasilar pulmonary scarring. Superimposed ill-defined opacity within the left lung base, which is new from the prior chest radiographs of 07/31/2021 and may reflect atelectasis and/or pneumonia. No appreciable airspace consolidation on the right. Known pulmonary nodules, better appreciated on the prior chest CT of 08/21/2021. No evidence of pleural effusion or pneumothorax. Degenerative changes of the spine. IMPRESSION: Ill-defined opacity within the left lung base, which is new from the prior chest radiographs of 07/31/2021 and may reflect atelectasis and/or pneumonia. Radiographic follow-up to resolution is recommended. Bibasilar pulmonary scarring. Known pulmonary nodules, better appreciated on the prior chest CT of 08/21/2021. Please refer to this prior report for further description and for follow-up recommendations. Aortic Atherosclerosis (ICD10-I70.0). Electronically Signed   By: Kellie Simmering D.O.   On: 11/26/2021 17:02   (Echo, Carotid, EGD, Colonoscopy, ERCP)     Subjective: Patient was seen and examined.  Wife at the bedside.  No overnight events.  Occasional dry cough mostly without any other symptoms.  Afebrile since hospitalization.  Eager to go home.   Discharge Exam: Vitals:   11/27/21 2122 11/28/21 0524  BP: 132/73 128/71  Pulse: 67 (!) 56  Resp: 18 20  Temp: 97.6 F (36.4 C) 97.7 F (36.5 C)  SpO2: 92% 97%   Vitals:   11/27/21 2053 11/27/21 2122 11/28/21 0500 11/28/21 0524  BP:  132/73  128/71  Pulse:  67  (!) 56  Resp:  18  20  Temp:  97.6 F (36.4 C)  97.7 F (36.5 C)  TempSrc:  Oral  Oral  SpO2: 97% 92%  97%  Weight:   80.1 kg   Height:        General: Pt is alert, awake, not in acute distress Cardiovascular: RRR, S1/S2 +, no rubs, no gallops Respiratory: CTA bilaterally, no wheezing, no rhonchi, no added sound. Abdominal: Soft, NT, ND, bowel sounds + Extremities: no edema, no cyanosis    The results of significant diagnostics from this hospitalization (including imaging, microbiology, ancillary and laboratory) are listed below for reference.     Microbiology: Recent Results (from the past 240 hour(s))  Resp Panel by RT-PCR (Flu A&B, Covid) Anterior Nasal Swab     Status: Abnormal  Collection Time: 11/21/21  9:19 AM   Specimen: Anterior Nasal Swab  Result Value Ref Range Status   SARS Coronavirus 2 by RT PCR POSITIVE (A) NEGATIVE Final    Comment: (NOTE) SARS-CoV-2 target nucleic acids are DETECTED.  The SARS-CoV-2 RNA is generally detectable in upper respiratory specimens during the acute phase of infection. Positive results are indicative of the presence of the identified virus, but do not rule out bacterial infection or co-infection with other pathogens not detected by the test. Clinical correlation with patient history and other diagnostic information is necessary to determine patient infection status. The expected result is Negative.  Fact Sheet for  Patients: EntrepreneurPulse.com.au  Fact Sheet for Healthcare Providers: IncredibleEmployment.be  This test is not yet approved or cleared by the [place] FDA and  has been authorized for detection and/or diagnosis of SARS-CoV-2 by FDA under an Emergency Use Authorization (EUA).  This EUA will remain in effect (meaning this test can be used) for the duration of  the COVID-19 declaration under Section 564(b)(1) of the A ct, 21 U.S.C. section 360bbb-3(b)(1), unless the authorization is terminated or revoked sooner.     Influenza A by PCR NEGATIVE NEGATIVE Final   Influenza B by PCR NEGATIVE NEGATIVE Final    Comment: (NOTE) The Xpert Xpress SARS-CoV-2/FLU/RSV plus assay is intended as an aid in the diagnosis of influenza from Nasopharyngeal swab specimens and should not be used as a sole basis for treatment. Nasal washings and aspirates are unacceptable for Xpert Xpress SARS-CoV-2/FLU/RSV testing.  Fact Sheet for Patients: EntrepreneurPulse.com.au  Fact Sheet for Healthcare Providers: IncredibleEmployment.be  This test is not yet approved or cleared by the [place] FDA and has been authorized for detection and/or diagnosis of SARS-CoV-2 by FDA under an Emergency Use Authorization (EUA). This EUA will remain in effect (meaning this test can be used) for the duration of the COVID-19 declaration under Section 564(b)(1) of the Act, 21 U.S.C. section 360bbb-3(b)(1), unless the authorization is terminated or revoked.  Performed at [facility], [place] [address], [place], [place] [address]      Labs: BNP (last 3 results) Recent Labs    11/26/21 2108  BNP 65.4   Basic Metabolic Panel: Recent Labs  Lab 11/26/21 1649 11/27/21 0706 11/28/21 0616  NA 136 135 137  K 3.7 3.7 3.6  CL 102 103 106  CO2 '25 24 23  '$ GLUCOSE 105* 107* 152*  BUN 25* 21 25*  CREATININE 1.37* 1.23 1.15  CALCIUM 9.0  8.7* 8.9  MG  --  2.0  --    Liver Function Tests: Recent Labs  Lab 11/26/21 1649 11/27/21 0706 11/28/21 0616  AST '22 18 16  '$ ALT '22 18 16  '$ ALKPHOS 53 49 45  BILITOT 0.7 0.5 0.5  PROT 6.3* 5.7* 5.2*  ALBUMIN 3.6 3.2* 2.9*   No results for input(s): "LIPASE", "AMYLASE" in the last 168 hours. No results for input(s): "AMMONIA" in the last 168 hours. CBC: Recent Labs  Lab 11/26/21 1649 11/27/21 0706 11/28/21 0616  WBC 8.3 6.4 4.2  NEUTROABS 6.7  --   --   HGB 14.3 13.2 12.4*  HCT 43.1 39.6 36.9*  MCV 90.0 89.0 88.7  PLT 178 153 164   Cardiac Enzymes: No results for input(s): "CKTOTAL", "CKMB", "CKMBINDEX", "TROPONINI" in the last 168 hours. BNP: Invalid input(s): "POCBNP" CBG: No results for input(s): "GLUCAP" in the last 168 hours. D-Dimer Recent Labs    11/27/21 0706 11/28/21 0616  DDIMER 0.43 0.41  Hgb A1c No results for input(s): "HGBA1C" in the last 72 hours. Lipid Profile No results for input(s): "CHOL", "HDL", "LDLCALC", "TRIG", "CHOLHDL", "LDLDIRECT" in the last 72 hours. Thyroid function studies No results for input(s): "TSH", "T4TOTAL", "T3FREE", "THYROIDAB" in the last 72 hours.  Invalid input(s): "FREET3" Anemia work up Recent Labs    11/27/21 0706 11/28/21 0612  FERRITIN 311 256   Urinalysis    Component Value Date/Time   COLORURINE YELLOW 11/26/2021 2214   APPEARANCEUR CLEAR 11/26/2021 2214   LABSPEC 1.017 11/26/2021 2214   PHURINE 6.0 11/26/2021 2214   GLUCOSEU NEGATIVE 11/26/2021 2214   HGBUR NEGATIVE 11/26/2021 2214   BILIRUBINUR NEGATIVE 11/26/2021 2214   KETONESUR NEGATIVE 11/26/2021 2214   PROTEINUR NEGATIVE 11/26/2021 2214   NITRITE NEGATIVE 11/26/2021 2214   LEUKOCYTESUR NEGATIVE 11/26/2021 2214   Sepsis Labs Recent Labs  Lab 11/26/21 1649 11/27/21 0706 11/28/21 0616  WBC 8.3 6.4 4.2   Microbiology Recent Results (from the past 240 hour(s))  Resp Panel by RT-PCR (Flu A&B, Covid) Anterior Nasal Swab     Status:  Abnormal   Collection Time: 11/21/21  9:19 AM   Specimen: Anterior Nasal Swab  Result Value Ref Range Status   SARS Coronavirus 2 by RT PCR POSITIVE (A) NEGATIVE Final    Comment: (NOTE) SARS-CoV-2 target nucleic acids are DETECTED.  The SARS-CoV-2 RNA is generally detectable in upper respiratory specimens during the acute phase of infection. Positive results are indicative of the presence of the identified virus, but do not rule out bacterial infection or co-infection with other pathogens not detected by the test. Clinical correlation with patient history and other diagnostic information is necessary to determine patient infection status. The expected result is Negative.  Fact Sheet for Patients: EntrepreneurPulse.com.au  Fact Sheet for Healthcare Providers: IncredibleEmployment.be  This test is not yet approved or cleared by the [place] FDA and  has been authorized for detection and/or diagnosis of SARS-CoV-2 by FDA under an Emergency Use Authorization (EUA).  This EUA will remain in effect (meaning this test can be used) for the duration of  the COVID-19 declaration under Section 564(b)(1) of the A ct, 21 U.S.C. section 360bbb-3(b)(1), unless the authorization is terminated or revoked sooner.     Influenza A by PCR NEGATIVE NEGATIVE Final   Influenza B by PCR NEGATIVE NEGATIVE Final    Comment: (NOTE) The Xpert Xpress SARS-CoV-2/FLU/RSV plus assay is intended as an aid in the diagnosis of influenza from Nasopharyngeal swab specimens and should not be used as a sole basis for treatment. Nasal washings and aspirates are unacceptable for Xpert Xpress SARS-CoV-2/FLU/RSV testing.  Fact Sheet for Patients: EntrepreneurPulse.com.au  Fact Sheet for Healthcare Providers: IncredibleEmployment.be  This test is not yet approved or cleared by the [place] FDA and has been authorized for detection  and/or diagnosis of SARS-CoV-2 by FDA under an Emergency Use Authorization (EUA). This EUA will remain in effect (meaning this test can be used) for the duration of the COVID-19 declaration under Section 564(b)(1) of the Act, 21 U.S.C. section 360bbb-3(b)(1), unless the authorization is terminated or revoked.  Performed at [facility], [place] [address], [place], [place] [address]      Time coordinating discharge:  32 minutes  SIGNED:   Barb Merino, MD  [facility] 11/28/2021, 11:05 AM

## 2021-11-29 LAB — LEGIONELLA PNEUMOPHILA SEROGP 1 UR AG: L. pneumophila Serogp 1 Ur Ag: NEGATIVE

## 2021-12-12 DIAGNOSIS — U071 COVID-19: Secondary | ICD-10-CM | POA: Diagnosis not present

## 2021-12-12 DIAGNOSIS — J449 Chronic obstructive pulmonary disease, unspecified: Secondary | ICD-10-CM | POA: Diagnosis not present

## 2021-12-12 DIAGNOSIS — I10 Essential (primary) hypertension: Secondary | ICD-10-CM | POA: Diagnosis not present

## 2021-12-20 ENCOUNTER — Telehealth: Payer: Self-pay | Admitting: Internal Medicine

## 2021-12-20 MED ORDER — BREZTRI AEROSPHERE 160-9-4.8 MCG/ACT IN AERO: 2.0000 | INHALATION_SPRAY | Freq: Two times a day (BID) | RESPIRATORY_TRACT | 11 refills | Status: AC

## 2021-12-20 NOTE — Telephone Encounter (Signed)
Called and spoke with patient, he is requesting that his Breztri prescription be printed out and mailed to his home (verified his new address, on file).  He was in the hospital 11/6-11/8 with Covid and pna, he states he is 90-95% better.  He was on the room when I called him so he said he would call back to schedule his hospital f/u with [place].  Breztri script printed out for Dr. Melvyn Novas to sign and placed in his cabinet in A pod for his signature.  After it is signed, please mail to patient.

## 2021-12-25 MED ORDER — BREZTRI AEROSPHERE 160-9-4.8 MCG/ACT IN AERO: 2.0000 | INHALATION_SPRAY | Freq: Two times a day (BID) | RESPIRATORY_TRACT | 11 refills | Status: DC

## 2021-12-25 NOTE — Telephone Encounter (Signed)
RX printed signed and mailed and patient notified. Nothing further needed at this time.

## 2021-12-25 NOTE — Telephone Encounter (Signed)
MW not back in the office again here until 12/31/21- Meagan, can you please print rx for [facility] and mail to the pt? Thanks!

## 2022-02-25 ENCOUNTER — Other Ambulatory Visit: Payer: Self-pay

## 2022-02-25 ENCOUNTER — Telehealth: Payer: Self-pay | Admitting: Internal Medicine

## 2022-02-25 MED ORDER — BREZTRI AEROSPHERE 160-9-4.8 MCG/ACT IN AERO: 2.0000 | INHALATION_SPRAY | Freq: Two times a day (BID) | RESPIRATORY_TRACT | 11 refills | Status: DC

## 2022-02-25 NOTE — Telephone Encounter (Signed)
Spoke to pt and will call [facility] once they open at 10 Am.

## 2022-02-25 NOTE — Telephone Encounter (Signed)
Spoke to Pharmacist at [facility] and was informed patient gave them an older paper prescription for [facility] which was 5.9 g from 12/20/21 instead of the newer prescription which was for 12/25/21 and 10.7 g and that's the reason for him getting less medicine. I reiterated what the pharmacist stated to me to the pt and he stated he will speak to someone at [place] [facility]. Nothing further needed.

## 2022-03-07 DIAGNOSIS — J449 Chronic obstructive pulmonary disease, unspecified: Secondary | ICD-10-CM | POA: Diagnosis not present

## 2022-03-07 DIAGNOSIS — Z23 Encounter for immunization: Secondary | ICD-10-CM | POA: Diagnosis not present

## 2022-03-07 DIAGNOSIS — I10 Essential (primary) hypertension: Secondary | ICD-10-CM | POA: Diagnosis not present

## 2022-03-07 DIAGNOSIS — E7800 Pure hypercholesterolemia, unspecified: Secondary | ICD-10-CM | POA: Diagnosis not present

## 2022-03-07 DIAGNOSIS — I700 Atherosclerosis of aorta: Secondary | ICD-10-CM | POA: Diagnosis not present

## 2022-03-07 DIAGNOSIS — E039 Hypothyroidism, unspecified: Secondary | ICD-10-CM | POA: Diagnosis not present

## 2022-03-07 DIAGNOSIS — J069 Acute upper respiratory infection, unspecified: Secondary | ICD-10-CM | POA: Diagnosis not present

## 2022-03-07 DIAGNOSIS — N1831 Chronic kidney disease, stage 3a: Secondary | ICD-10-CM | POA: Diagnosis not present

## 2022-07-02 DIAGNOSIS — H52223 Regular astigmatism, bilateral: Secondary | ICD-10-CM | POA: Diagnosis not present

## 2022-07-02 DIAGNOSIS — H04123 Dry eye syndrome of bilateral lacrimal glands: Secondary | ICD-10-CM | POA: Diagnosis not present

## 2022-07-02 DIAGNOSIS — H35373 Puckering of macula, bilateral: Secondary | ICD-10-CM | POA: Diagnosis not present

## 2022-07-02 DIAGNOSIS — H524 Presbyopia: Secondary | ICD-10-CM | POA: Diagnosis not present

## 2022-07-02 DIAGNOSIS — H43812 Vitreous degeneration, left eye: Secondary | ICD-10-CM | POA: Diagnosis not present

## 2022-07-02 DIAGNOSIS — H25813 Combined forms of age-related cataract, bilateral: Secondary | ICD-10-CM | POA: Diagnosis not present

## 2022-07-17 DIAGNOSIS — I10 Essential (primary) hypertension: Secondary | ICD-10-CM | POA: Diagnosis not present

## 2022-07-17 DIAGNOSIS — E039 Hypothyroidism, unspecified: Secondary | ICD-10-CM | POA: Diagnosis not present

## 2022-07-17 DIAGNOSIS — G4700 Insomnia, unspecified: Secondary | ICD-10-CM | POA: Diagnosis not present

## 2022-07-17 DIAGNOSIS — N1831 Chronic kidney disease, stage 3a: Secondary | ICD-10-CM | POA: Diagnosis not present

## 2022-07-17 DIAGNOSIS — J449 Chronic obstructive pulmonary disease, unspecified: Secondary | ICD-10-CM | POA: Diagnosis not present

## 2022-07-17 DIAGNOSIS — M1610 Unilateral primary osteoarthritis, unspecified hip: Secondary | ICD-10-CM | POA: Diagnosis not present

## 2022-07-17 DIAGNOSIS — E7800 Pure hypercholesterolemia, unspecified: Secondary | ICD-10-CM | POA: Diagnosis not present

## 2022-08-01 ENCOUNTER — Ambulatory Visit: Payer: Medicare HMO | Admitting: Internal Medicine

## 2022-08-01 NOTE — Progress Notes (Signed)
Brandon Sanford, male    DOB: [birth year],   MRN: [id number]   Brief patient profile:  32 yowm quit smoking in 1997/MM with GOLD 2 COPD 2008  self referred to pulmonary clinic 09/07/2020 for cough / abn cxr from 07/22/20 when rx as CAP with levaquin 750 x 7 days  Seen 2008   COPD      -PFTs 06/03/07  FEV1 56% ratio 40% 27% improvement after bronchodilators and DLCO 87%    Bronchiectasis by CT 03/02/07 LLL     History of Present Illness  09/07/2020  Pulmonary/ 1st office eval/Arleth Mccullar  GOLD ? Still II COPD/bronchiectasis  maint on advair 500   Chief Complaint  Patient presents with   Consult    PNA 07/19/20, more SOB and dry cough since. Using Advair, feels like it helps  Baseline doe x fast walk/ incline on generic  advair 500  Dyspnea:  slow walk maybe a block /no desats  Cough: 24/7 dry hack  Sleep: on  side flat bed  SABA use: duoneb a week prior to OV   Had pleuritic cp R ant/lat resolved now/ no fever  Rec Stop generic advair = xiella 500  Start stiolto one puff each am - if breathing getting worse and you need to use the backup inhaler (albuterol) or nebulizer (albuterol/ipratropium) then increase stiolto to 2 puffs each am  Try prilosec otc 20mg   Take 30-60 min before first meal of the day and Pepcid ac (famotidine)  otc 20 mg one @  bedtime until cough is completely gone for at least a week without the need for cough suppression For cough > ok to try delsym otc 2 tsp every 12 hours and add tessalon 200 mg three times daily if still coughing  Please schedule a follow up office visit in 4 weeks, sooner if needed  >>> late add :   f/u final cxr planned / recheck esr at 4 weeks     06/12/2021  acute ov/Brandon Sanford re: GOLD 2    maint on stiolto  much worse since cough / better with neb but did not follow ABC action plan   Chief Complaint  Patient presents with   Acute Visit   Dyspnea:  was better on one stiolto daily until coughing flared x one week prior to OV while off gerdrx  Cough: min  mucoid  Sleeping: poorly since cough SABA use: just started 02: none  Rec Plan A = Automatic = Always=    Stiolto 2 puffs daily  Plan B = Backup (to supplement plan A, not to replace it) Only use your levoalbuterol inhaler as a rescue medication  Plan C = Crisis (instead of Plan B but only if Plan B stops working) - only use your levoalbuterol nebulizer if you first try Plan B Pantoprazole (protonix) 40 mg   Take  30-60 min before first meal of the day and Pepcid (famotidine)  20 mg after supper until return to office  GERD  diet Prednisone 10 mg take  4 each am x 2 days,   2 each am x 2 days,  1 each am x 2 days and stop  Leep July appt - call sooner if needed  Late add needs to go back to breztri once finished prednisone rx      07/31/2021  :   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  Level 140   08/02/2022  Yearly f/u ov/Julieann Drummonds re: GOLD 2 copd /MPNs with bronchiectasis maint on breztri  2bid   Chief Complaint  Patient presents with   Follow-up    States cough improved, DOE  Dyspnea:  walking new neighborhood  slt inclines doe uphill but mostly limited by L hip pain  Cough: none  Sleeping: flat bed one pillow/ no resp cc  SABA use: not needing  02: none  Covid status:   infected twice/ all vax plus RSV    No obvious day to day or daytime variability or assoc excess/ purulent sputum or mucus plugs or hemoptysis or cp or chest tightness, subjective wheeze or overt sinus or hb symptoms.     Also denies any obvious fluctuation of symptoms with weather or environmental changes or other aggravating or alleviating factors except as outlined above   No unusual exposure hx or h/o childhood pna/ asthma or knowledge of premature birth.  Current Allergies, Complete Past Medical History, Past Surgical History, Family History, and Social History were reviewed in [employer] record.  ROS  The following are not active complaints unless bolded Hoarseness, sore throat, dysphagia,  dental problems, itching, sneezing,  nasal congestion or discharge of excess mucus or purulent secretions, ear ache,   fever, chills, sweats, unintended wt loss or wt gain, classically pleuritic or exertional cp,  orthopnea pnd or arm/hand swelling  or leg swelling, presyncope, palpitations, abdominal pain, anorexia, nausea, vomiting, diarrhea  or change in bowel habits or change in bladder habits, change in stools or change in urine, dysuria, hematuria,  rash, arthralgias, visual complaints, headache, numbness, weakness or ataxia or problems with walking or coordination,  change in mood or  memory.        Current Meds  Medication Sig   albuterol (VENTOLIN HFA) 108 (90 Base) MCG/ACT inhaler Inhale 2 puffs into the lungs every 6 (six) hours as needed for wheezing or shortness of breath.   amLODipine (NORVASC) 5 MG tablet Take 5 mg by mouth daily.   Budeson-Glycopyrrol-Formoterol (BREZTRI AEROSPHERE) 160-9-4.8 MCG/ACT AERO Inhale 2 puffs into the lungs in the morning and at bedtime.   hydrochlorothiazide (MICROZIDE) 12.5 MG capsule Take 12.5 mg by mouth daily.   levothyroxine (SYNTHROID, LEVOTHROID) 100 MCG tablet Take 100 mcg by mouth daily before breakfast.   Multiple Vitamin (MULTIVITAMIN) tablet Take 1 tablet by mouth daily.   Polyethyl Glycol-Propyl Glycol 0.4-0.3 % SOLN Place 1-2 drops into both eyes 3 (three) times daily as needed (dry eyes/irritated eyes).   simvastatin (ZOCOR) 40 MG tablet Take 40 mg by mouth every evening.    TUMS 500 MG chewable tablet Chew 500 mg by mouth 2 (two) times daily as needed for indigestion or heartburn.                  Past Medical History:  Diagnosis Date   Allergy    Arthritis    Asthma    as a child   Cancer (HCC)    skin - neck  "pre- cancer"   COPD (chronic obstructive pulmonary disease) (HCC)    Depression    pt denies   Dyspnea    With exertion   GERD (gastroesophageal reflux disease)    tums if needed   Hyperlipidemia     Hypertension    Hypothyroidism    Inguinal hernia    Plantar fasciitis    Skin moles    Thyroid disease    Umbilical hernia    repaired         Objective:     Wts  08/02/2022  178  07/31/2021      174  06/12/2021      174  01/23/2021         174   10/18/20 175 lb 6.4 oz (79.6 kg)  09/07/20 169 lb 8 oz (76.9 kg)  03/23/20 178 lb (80.7 kg)      Vital signs reviewed  08/02/2022  - Note at rest 02 sats  97% on RA   General appearance:    pleasant healthy appearing amb wm nad   HEENT : Oropharynx  clear     NECK :  without  apparent JVD/ palpable Nodes/TM    LUNGS: no acc muscle use,  Min barrel  contour chest wall with bilateral  slightly decreased bs s audible wheeze and  without cough on insp or exp maneuvers and min  Hyperresonant  to  percussion bilaterally    CV:  RRR  no s3 or murmur or increase in P2, and no edema   ABD:  soft and nontender    MS:  Nl gait/ ext warm without deformities Or obvious joint restrictions  calf tenderness, cyanosis or clubbing     SKIN: warm and dry without lesions    NEURO:  alert, approp, nl sensorium with  no motor or cerebellar deficits apparent.         CXR PA and Lateral:   08/02/2022 :    I personally reviewed images and impression is as follows:     No acute or interval changes/ c/w mild copd            Assessment

## 2022-08-02 ENCOUNTER — Ambulatory Visit: Payer: Medicare HMO

## 2022-08-02 ENCOUNTER — Ambulatory Visit: Payer: Medicare HMO | Admitting: Internal Medicine

## 2022-08-02 ENCOUNTER — Encounter: Payer: Self-pay | Admitting: Internal Medicine

## 2022-08-02 VITALS — BP 126/66 | HR 67 | Temp 97.8°F | Ht 69.0 in | Wt 178.0 lb

## 2022-08-02 DIAGNOSIS — R918 Other nonspecific abnormal finding of lung field: Secondary | ICD-10-CM | POA: Diagnosis not present

## 2022-08-02 DIAGNOSIS — J449 Chronic obstructive pulmonary disease, unspecified: Secondary | ICD-10-CM

## 2022-08-02 DIAGNOSIS — J479 Bronchiectasis, uncomplicated: Secondary | ICD-10-CM | POA: Diagnosis not present

## 2022-08-02 DIAGNOSIS — R911 Solitary pulmonary nodule: Secondary | ICD-10-CM | POA: Diagnosis not present

## 2022-08-02 NOTE — Assessment & Plan Note (Addendum)
Quit smoking 1997  -  Bronchiectasis by CT 03/02/07 LLL -   PFTs 06/03/07  FEV1 56% ratio 40% 27% improvement after bronchodilators and DLCO 87% -  Allergy profile 09/07/20 >  Eos 0.3 /  IgE  65 - 01/23/2021  After extensive coaching inhaler device,  effectiveness =    90% continue breztri  - PFT's  01/24/21   FEV1 1.79 (67 % ) ratio 0.48  p 15%improvement from saba p ? breztri prior to study with DLCO  16.64 (72%)  FV curve classic concavity    07/31/2021  :    alpha one AT phenotype  MM  Level 140  - 08/02/2022  After extensive coaching inhaler device,  effectiveness =    95% > continue breztri    Group D (now reclassified as E) in terms of symptom/risk and laba/lama/ICS  therefore appropriate rx at this point >>>  breztri and approp saba   F/u yearly

## 2022-08-02 NOTE — Patient Instructions (Addendum)
No change in medications  Please remember to go to the  x-ray department  for your tests - we will call you with the results when they are available    Please schedule a follow up visit in  12 months but call sooner if needed  

## 2022-08-02 NOTE — Assessment & Plan Note (Signed)
Quit smoking 1997 - see cxr 10/18/2020 L[facility] SPN 7 mm not seen on cxr 06/17/20 or ct  05/07/17 > still present 07/31/2021  - CT chest  08/21/21  No change MPNs    Cxr s interval change and pt comfortable with yearly cxr's moving forward  Discussed in detail all the  indications, usual  risks and alternatives  relative to the benefits with patient who agrees to proceed with conservative f/u as outlined           Each maintenance medication was reviewed in detail including emphasizing most importantly the difference between maintenance and prns and under what circumstances the prns are to be triggered using an action plan format where appropriate.  Total time for H and P, chart review, counseling, reviewing hfa device(s) and generating customized AVS unique to this office visit / same day charting = 30 min

## 2022-09-10 DIAGNOSIS — I10 Essential (primary) hypertension: Secondary | ICD-10-CM | POA: Diagnosis not present

## 2022-09-10 DIAGNOSIS — E7800 Pure hypercholesterolemia, unspecified: Secondary | ICD-10-CM | POA: Diagnosis not present

## 2022-09-10 DIAGNOSIS — Z0000 Encounter for general adult medical examination without abnormal findings: Secondary | ICD-10-CM | POA: Diagnosis not present

## 2022-09-10 DIAGNOSIS — J449 Chronic obstructive pulmonary disease, unspecified: Secondary | ICD-10-CM | POA: Diagnosis not present

## 2022-09-10 DIAGNOSIS — E039 Hypothyroidism, unspecified: Secondary | ICD-10-CM | POA: Diagnosis not present

## 2022-10-24 DIAGNOSIS — H43812 Vitreous degeneration, left eye: Secondary | ICD-10-CM | POA: Diagnosis not present

## 2022-10-24 DIAGNOSIS — H25813 Combined forms of age-related cataract, bilateral: Secondary | ICD-10-CM | POA: Diagnosis not present

## 2022-10-24 DIAGNOSIS — H04123 Dry eye syndrome of bilateral lacrimal glands: Secondary | ICD-10-CM | POA: Diagnosis not present

## 2022-10-24 DIAGNOSIS — H35373 Puckering of macula, bilateral: Secondary | ICD-10-CM | POA: Diagnosis not present

## 2023-06-26 ENCOUNTER — Ambulatory Visit
Admission: EM | Admit: 2023-06-26 | Discharge: 2023-06-26 | Disposition: A | Discharge status: Home / Self Care | Attending: Family Medicine | Admitting: Family Medicine

## 2023-06-26 ENCOUNTER — Ambulatory Visit (INDEPENDENT_AMBULATORY_CARE_PROVIDER_SITE_OTHER)

## 2023-06-26 DIAGNOSIS — R051 Acute cough: Secondary | ICD-10-CM | POA: Diagnosis not present

## 2023-06-26 DIAGNOSIS — R0602 Shortness of breath: Secondary | ICD-10-CM

## 2023-06-26 DIAGNOSIS — J441 Chronic obstructive pulmonary disease with (acute) exacerbation: Secondary | ICD-10-CM | POA: Diagnosis not present

## 2023-06-26 LAB — POC COVID19/FLU A&B COMBO
Covid Antigen, POC: NEGATIVE
Influenza A Antigen, POC: NEGATIVE
Influenza B Antigen, POC: NEGATIVE

## 2023-06-26 LAB — POC RSV: RSV Antigen, POC: NEGATIVE

## 2023-06-26 MED ORDER — IPRATROPIUM-ALBUTEROL 0.5-2.5 (3) MG/3ML IN SOLN
3.0000 mL | Freq: Once | RESPIRATORY_TRACT | Status: AC
  Administered 2023-06-26: 3 mL via RESPIRATORY_TRACT

## 2023-06-26 MED ORDER — PROMETHAZINE-DM 6.25-15 MG/5ML PO SYRP: 5.0000 mL | ORAL_SOLUTION | Freq: Three times a day (TID) | ORAL | 0 refills | Status: DC | PRN

## 2023-06-26 MED ORDER — PREDNISONE 20 MG PO TABS: 40.0000 mg | ORAL_TABLET | Freq: Every day | ORAL | 0 refills | Status: DC

## 2023-06-26 MED ORDER — AMOXICILLIN-POT CLAVULANATE 875-125 MG PO TABS: 1.0000 | ORAL_TABLET | Freq: Two times a day (BID) | ORAL | 0 refills | Status: DC

## 2023-06-26 NOTE — Discharge Instructions (Addendum)
 Start Augmentin  twice daily for 7 days.  Start prednisone  daily for 5 days as well.  Continue your inhalers as needed.  May take Promethazine  DM as needed for your cough.  Please note this medication will make you drowsy.  Do not drink alcohol or drive on this medication.  Lots of rest and fluids.  Please follow-up with your pulmonologist or PCP in 2 days for recheck.  Please go to the ER if you develop any worsening symptoms.  Hope you feel better soon!

## 2023-06-26 NOTE — ED Provider Notes (Signed)
 [facility]    CSN: [id number] Arrival date & time: 06/26/23  0802      History   Chief Complaint No chief complaint on file.   HPI Brandon Sanford is a 83 y.o. male  presents for evaluation of URI symptoms for 2 days. Patient reports associated symptoms of productive cough, congestion, shortness of breath, fatigue, fever. Denies N/V/D, ear pain, sore throat, body.  Patient has a history of COPD.  Reports increase in shortness of breath and sputum production.  Also reports history of pneumonia and was hospitalized for this after contracting COVID in November 2023.  Reports no sick contacts.  Pt has taken Coricidin and Tylenol  OTC for symptoms. Pt has no other concerns at this time.   HPI  Past Medical History:  Diagnosis Date   Allergy    Arthritis    Asthma    as a child   Cancer (HCC)    skin - neck  "pre- cancer"   COPD (chronic obstructive pulmonary disease) (HCC)    Depression    pt denies   Dyspnea    With exertion   GERD (gastroesophageal reflux disease)    tums if needed   Hyperlipidemia    Hypertension    Hypothyroidism    Inguinal hernia    Plantar fasciitis    Skin moles    Thyroid  disease    Umbilical hernia    repaired    Patient Active Problem List   Diagnosis Date Noted   Stage 3a chronic kidney disease (CKD) (HCC) 11/26/2021   Pneumonia 11/26/2021   COVID-19 virus infection 11/26/2021   Medication reaction 12/29/2020   History of tobacco abuse 12/29/2020   GERD without esophagitis 12/29/2020   Allergic rhinitis with postnasal drip 12/29/2020   Tachycardia 09/07/2020   CAP (community acquired pneumonia) 09/07/2020   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   Pain in 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   Unilateral primary osteoarthritis, 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   Umbilical pain 02/12/2012   COPD  GOLD II 06/03/2007   Solitary pulmonary nodule 04/27/2007   DYSPNEA 04/27/2007   Hypothyroidism 04/24/2007   DEPRESSION 04/24/2007   Essential  hypertension 04/24/2007    Past Surgical History:  Procedure Laterality Date   COLONOSCOPY     COLONOSCOPY W/ POLYPECTOMY     HERNIA REPAIR  11/26/10   umbilical hernia and LIH repair    INGUINAL HERNIA REPAIR  11/26/2010   Procedure: HERNIA REPAIR INGUINAL ADULT;  Surgeon: Cloyce Darby, MD;  Location: [facility];  Service: General;  Laterality: Left;  left inguinal hernia repair with mesh   INGUINAL HERNIA REPAIR Right 02/04/2018   Procedure: REPAIR OF RIGHT INGUINAL HERNIA WITH MESH;  Surgeon: Dorena Gander, MD;  Location: [facility] OR;  Service: General;  Laterality: Right;   INGUINAL HERNIA REPAIR Right 10/16/2018   Procedure: LAPAROSCOPIC RIGHT INGUINAL HERNIA;  Surgeon: Shela Derby, MD;  Location: [facility] OR;  Service: General;  Laterality: Right;   INSERTION OF MESH Right 02/04/2018   Procedure: INSERTION OF MESH;  Surgeon: Dorena Gander, MD;  Location: [facility] OR;  Service: General;  Laterality: Right;   INSERTION OF MESH N/A 10/16/2018   Procedure: Insertion Of Mesh;  Surgeon: Shela Derby, MD;  Location: [facility] OR;  Service: General;  Laterality: N/A;   TONSILLECTOMY     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   Procedure: LEFT TOTAL HIP ARTHROPLASTY ANTERIOR APPROACH;  Surgeon: Arnie Lao, MD;  Location: [facility] OR;  Service:  Orthopedics;  Laterality: Left;   UMBILICAL HERNIA REPAIR  11/26/2010   Procedure: HERNIA REPAIR UMBILICAL ADULT;  Surgeon: Cloyce Darby, MD;  Location: [facility];  Service: General;  Laterality: N/A;  umbilical hernia repair with mesh       Home Medications    Prior to Admission medications   Medication Sig Start Date End Date Taking? Authorizing Provider  amoxicillin -clavulanate (AUGMENTIN ) 875-125 MG tablet Take 1 tablet by mouth every 12 (twelve) hours. 06/26/23  Yes Gloriajean Okun, Jodi R, NP  predniSONE  (DELTASONE ) 20 MG tablet Take 2 tablets (40 mg total) by mouth daily with breakfast for 5 days. 06/26/23 07/01/23 Yes Efrem Pitstick,  Jodi R, NP  promethazine -dextromethorphan  (PROMETHAZINE -DM) 6.25-15 MG/5ML syrup Take 5 mLs by mouth 3 (three) times daily as needed for cough. 06/26/23  Yes Bowie Delia, Jodi R, NP  albuterol  (VENTOLIN  HFA) 108 (90 Base) MCG/ACT inhaler Inhale 2 puffs into the lungs every 6 (six) hours as needed for wheezing or shortness of breath. 01/24/21   [name], MD  amLODipine  (NORVASC ) 5 MG tablet Take 5 mg by mouth daily.    [provider]  Budeson-Glycopyrrol-Formoterol  (BREZTRI  AEROSPHERE) 160-9-4.8 MCG/ACT AERO Inhale 2 puffs into the lungs in the morning and at bedtime. 12/20/21   Wert, Michael B, MD  hydrochlorothiazide (MICROZIDE) 12.5 MG capsule Take 12.5 mg by mouth daily.    [provider]  levothyroxine  (SYNTHROID , LEVOTHROID) 100 MCG tablet Take 100 mcg by mouth daily before breakfast.    [provider]  Multiple Vitamin (MULTIVITAMIN) tablet Take 1 tablet by mouth daily.    [provider]  Polyethyl Glycol-Propyl Glycol 0.4-0.3 % SOLN Place 1-2 drops into both eyes 3 (three) times daily as needed (dry eyes/irritated eyes).    [provider]  simvastatin  (ZOCOR ) 40 MG tablet Take 40 mg by mouth every evening.     [provider]  TUMS 500 MG chewable tablet Chew 500 mg by mouth 2 (two) times daily as needed for indigestion or heartburn. 04/19/17   [provider]    Family History Family History  Problem Relation Age of Onset   Colon cancer Neg Hx    Colon polyps Neg Hx    Esophageal cancer Neg Hx    Stomach cancer Neg Hx    Rectal cancer Neg Hx     Social History Social History   Tobacco Use   Smoking status: Former    Current packs/day: 0.00    Types: Cigarettes    Start date: 11/12/1955    Quit date: 11/12/1995    Years since quitting: 27.6   Smokeless tobacco: Never  Vaping Use   Vaping status: Never Used  Substance Use Topics   Alcohol use: Yes    Alcohol/week: 2.0 standard drinks of alcohol    Types: 2  Cans of beer per week   Drug use: No     Allergies   Lisinopril, Chlorhexidine , and Pneumococcal vaccines   Review of Systems Review of Systems  Constitutional:  Positive for fatigue and fever.  HENT:  Positive for congestion.   Respiratory:  Positive for cough and shortness of breath.      Physical Exam Triage Vital Signs ED Triage Vitals  Encounter Vitals Group     BP 06/26/23 0811 111/65     Systolic BP Percentile --      Diastolic BP Percentile --      Pulse Rate 06/26/23 0811 (!) 115     Resp 06/26/23 0811 Aaron Aas)  24     Temp 06/26/23 0811 99.7 F (37.6 C)     Temp Source 06/26/23 0811 Oral     SpO2 06/26/23 0811 90 %     Weight --      Height --      Head Circumference --      Peak Flow --      Pain Score 06/26/23 0808 0     Pain Loc --      Pain Education --      Exclude from Growth Chart --    No data found.  Updated Vital Signs BP 111/65 (BP Location: Left Arm)   Pulse (!) 115   Temp 99.7 F (37.6 C) (Oral)   Resp (!) 24   SpO2 90%   Visual Acuity Right Eye Distance:   Left Eye Distance:   Bilateral Distance:    Right Eye Near:   Left Eye Near:    Bilateral Near:     Physical Exam Vitals and nursing note reviewed.  Constitutional:      General: He is not in acute distress.    Appearance: Normal appearance. He is not ill-appearing or toxic-appearing.  HENT:     Head: Normocephalic and atraumatic.     Right Ear: Tympanic membrane and ear canal normal.     Left Ear: Tympanic membrane and ear canal normal.     Nose: Congestion present.     Mouth/Throat:     Mouth: Mucous membranes are moist.     Pharynx: No posterior oropharyngeal erythema.  Eyes:     Pupils: Pupils are equal, round, and reactive to light.  Cardiovascular:     Rate and Rhythm: Normal rate and regular rhythm.     Heart sounds: Normal heart sounds.  Pulmonary:     Effort: Pulmonary effort is normal. No respiratory distress.     Breath sounds: Normal breath sounds. No  stridor. No wheezing, rhonchi or rales.  Musculoskeletal:     Cervical back: Normal range of motion and neck supple.  Lymphadenopathy:     Cervical: No cervical adenopathy.  Skin:    General: Skin is warm and dry.  Neurological:     General: No focal deficit present.     Mental Status: He is alert and oriented to person, place, and time.  Psychiatric:        Mood and Affect: Mood normal.        Behavior: Behavior normal.      UC Treatments / Results  Labs (all labs ordered are listed, but only abnormal results are displayed) Labs Reviewed  POC COVID19/FLU A&B COMBO  POC RSV    EKG   Radiology DG Chest 2 View Result Date: 06/26/2023 CLINICAL DATA:  Cough, shortness of breath EXAM: CHEST - 2 VIEW COMPARISON:  August 02, 2022 FINDINGS: The heart size and mediastinal contours are within normal limits. Both lungs are clear. The visualized skeletal structures are unremarkable. IMPRESSION: COPD with basilar hypoventilatory atelectasis. No acute cardiopulmonary infiltrates Electronically Signed   By: Fredrich Jefferson M.D.   On: 06/26/2023 08:43    Procedures Procedures (including critical care time)  Medications Ordered in UC Medications  ipratropium-albuterol  (DUONEB) 0.5-2.5 (3) MG/3ML nebulizer solution 3 mL (3 mLs Nebulization Given 06/26/23 0842)    Initial Impression / Assessment and Plan / UC Course  I have reviewed the triage vital signs and the nursing notes.  Pertinent labs & imaging results that were available during my care of the patient were  reviewed by me and considered in my medical decision making (see chart for details).  Clinical Course as of 06/26/23 0906  Thu Jun 26, 2023  0903 Pulse Rate(!): 115 [JM]  0904 Resp rate after nebulizer 22, O2 93% on RA, HR 105 [JM]    Clinical Course User Index [JM] [name], NP    Reviewed exam and symptoms with patient.  No red flags.  Chest x-ray negative for pneumonia.  Negative COVID, flu, RSV testing.  Discussed  COPD exacerbation/viral illness.  Patient reports improvement in shortness of breath after nebulizer.  Will treat with Augmentin , prednisone , Promethazine  DM.  He is to continue his inhalers as prescribed.  Advised PCP or pulmonology follow-up in 2 days for recheck.  Strict ER precautions reviewed with patient verbalized understanding. . Final Clinical Impressions(s) / UC Diagnoses   Final diagnoses:  Acute cough  Shortness of breath  COPD exacerbation (HCC)     Discharge Instructions      Start Augmentin  twice daily for 7 days.  Start prednisone  daily for 5 days as well.  Continue your inhalers as needed.  May take Promethazine  DM as needed for your cough.  Please note this medication will make you drowsy.  Do not drink alcohol or drive on this medication.  Lots of rest and fluids.  Please follow-up with your pulmonologist or PCP in 2 days for recheck.  Please go to the ER if you develop any worsening symptoms.  Hope you feel better soon!   ED Prescriptions     Medication Sig Dispense Auth. Provider   amoxicillin -clavulanate (AUGMENTIN ) 875-125 MG tablet Take 1 tablet by mouth every 12 (twelve) hours. 14 tablet Estefania Kamiya, Jodi R, NP   predniSONE  (DELTASONE ) 20 MG tablet Take 2 tablets (40 mg total) by mouth daily with breakfast for 5 days. 10 tablet Amilee Janvier, Jodi R, NP   promethazine -dextromethorphan  (PROMETHAZINE -DM) 6.25-15 MG/5ML syrup Take 5 mLs by mouth 3 (three) times daily as needed for cough. 118 mL Brenleigh Collet, Jodi R, NP      PDMP not reviewed this encounter.   [name], NP 06/26/23 [phone]

## 2023-06-26 NOTE — ED Triage Notes (Signed)
 Pt presents to UC for c/o cough, fever, fatigue x2 days. T max 101 last night Pt has tried Corcidin and honey Pt reports hx COPD

## 2023-06-27 ENCOUNTER — Ambulatory Visit: Payer: Self-pay | Admitting: Internal Medicine

## 2023-06-27 NOTE — Telephone Encounter (Signed)
 FYI Only or Action Required?: FYI only for provider  Patient is followed in Pulmonology for COPD, last seen on 08/02/2022 by [name], MD. Called Nurse Triage reporting Shortness of Breath. Symptoms began several days ago. Interventions attempted: Prescription medications: abx, steroids, cough syrup, Rescue inhaler, and Maintenance inhaler. Symptoms are: unchanged.  Triage Disposition: Call PCP Now  Patient/caregiver understands and will follow disposition?: Yes   Scheduled patient on the next available acute visit appt on 06/30/2023.   Copied from CRM [id number]. Topic: Clinical - Red Word Triage >> Jun 27, 2023 11:19 AM Ambrose Junk wrote: Red Word that prompted transfer to Nurse Triage:  High fever, diff breathing. Reason for Disposition  [1] Monitoring oxygen level (e.g., pulse oximetry) AND [2] fall in oxygen level 4% or more (below known patient baseline, while awake and resting) AND [3] new difficulty breathing or worse than when discharged from hospital  Answer Assessment - Initial Assessment Questions E2C2 Pulmonary Triage - Initial Assessment Questions "Chief Complaint (e.g., cough, sob, wheezing, fever, chills, sweat or additional symptoms) *Go to specific symptom protocol after initial questions. SOB Recent UC visit - took CXR and sent home with Rx (abx, steroids, and cough syrup)  "How long have symptoms been present?" Tuesday  Have you tested for COVID or Flu? Note: If not, ask patient if a home test can be taken. If so, instruct patient to call back for positive results. No, negative at [facility]  MEDICINES:   "Have you used any OTC meds to help with symptoms?" No If yes, ask "What medications?" N/a  "Have you used your inhalers/maintenance medication?" Yes If yes, "What medications?" Breztri  - 2puffs twice a day Reports rescue INH - last used today 10:00 - reports provided relief   If inhaler, ask "How many puffs and how often?" Note: Review instructions on medication  in the chart. See above  OXYGEN: "Do you wear supplemental oxygen?" No If yes, "How many liters are you supposed to use?" N/a  "Do you monitor your oxygen levels?" Yes If yes, "What is your reading (oxygen level) today?" 89 on RA  "What is your usual oxygen saturation reading?"  (Note: Pulmonary O2 sats should be 90% or greater) 96    3. PATTERN "Does the difficult breathing come and go, or has it been constant since it started?"      constant 4. SEVERITY: "How bad is your breathing?" (e.g., mild, moderate, severe)    - MILD: No SOB at rest, mild SOB with walking, speaks normally in sentences, can lie down, no retractions, pulse < 100.    - MODERATE: SOB at rest, SOB with minimal exertion and prefers to sit, cannot lie down flat, speaks in phrases, mild retractions, audible wheezing, pulse 100-120.    - SEVERE: Very SOB at rest, speaks in single words, struggling to breathe, sitting hunched forward, retractions, pulse > 120      Mild-moderate - mostly exertional SOB Triager does appreciate audible SOB/wheezing during call. Pt is speaking in partial-full sentences.  5. RECURRENT SYMPTOM: "Have you had difficulty breathing before?" If Yes, ask: "When was the last time?" and "What happened that time?"      Yes, 1.5 years ago 6. CARDIAC HISTORY: "Do you have any history of heart disease?" (e.g., heart attack, angina, bypass surgery, angioplasty)      denies 7. LUNG HISTORY: "Do you have any history of lung disease?"  (e.g., pulmonary embolus, asthma, emphysema)     COPD 8. CAUSE: "What do you think  is causing the breathing problem?"      COPD flare 9. OTHER SYMPTOMS: "Do you have any other symptoms? (e.g., dizziness, runny nose, cough, chest pain, fever)     denies  Protocols used: Breathing Difficulty-A-AH, COPD Oxygen Monitoring and Hypoxia-A-AH

## 2023-06-28 NOTE — Progress Notes (Unsigned)
 Brandon Sanford, male    DOB: 1940/03/15,   MRN: [id number]   Brief patient profile:  62 yowm quit smoking in 1997/MM with GOLD 2 COPD 2008  self referred to pulmonary clinic 09/07/2020 for cough / abn cxr from 07/22/20 when rx as CAP with levaquin  750 x 7 days  Seen 2008   COPD      -PFTs 06/03/07  FEV1 56% ratio 40% 27% improvement after bronchodilators and DLCO 87%    Bronchiectasis by CT 03/02/07 [facility]     History of Present Illness  09/07/2020  Pulmonary/ 1st office eval/Brandon Sanford  GOLD ? Still II COPD/bronchiectasis  maint on advair 500   Chief Complaint  Patient presents with   Consult    PNA 07/19/20, more SOB and dry cough since. Using Advair, feels like it helps  Baseline doe x fast walk/ incline on generic  advair 500  Dyspnea:  slow walk maybe a block /no desats  Cough: 24/7 dry hack  Sleep: on  side flat bed  SABA use: duoneb a week prior to OV   Had pleuritic cp R ant/lat resolved now/ no fever  Rec Stop generic advair = xiella 500  Start stiolto one puff each am - if breathing getting worse and you need to use the backup inhaler (albuterol ) or nebulizer (albuterol /ipratropium) then increase stiolto to 2 puffs each am  Try prilosec otc 20mg   Take 30-60 min before first meal of the day and Pepcid  ac (famotidine )  otc 20 mg one @  bedtime until cough is completely gone for at least a week without the need for cough suppression For cough > ok to try delsym  otc 2 tsp every 12 hours and add tessalon  200 mg three times daily if still coughing  Please schedule a follow up office visit in 4 weeks, sooner if needed  >>> late add :   f/u final cxr planned / recheck esr at 4 weeks     06/12/2021  acute ov/Brandon Sanford re: GOLD 2    maint on stiolto  much worse since cough / better with neb but did not follow ABC action plan   Chief Complaint  Patient presents with   Acute Visit   Dyspnea:  was better on one stiolto daily until coughing flared x one week prior to OV while off gerdrx  Cough: min  mucoid  Sleeping: poorly since cough SABA use: just started 02: none  Rec Plan A = Automatic = Always=    Stiolto 2 puffs daily  Plan B = Backup (to supplement plan A, not to replace it) Only use your levoalbuterol inhaler as a rescue medication  Plan C = Crisis (instead of Plan B but only if Plan B stops working) - only use your levoalbuterol nebulizer if you first try Plan B Pantoprazole  (protonix ) 40 mg   Take  30-60 min before first meal of the day and Pepcid  (famotidine )  20 mg after supper until return to office  GERD  diet Prednisone  10 mg take  4 each am x 2 days,   2 each am x 2 days,  1 each am x 2 days and stop  Leep July appt - call sooner if needed  Late add needs to go back to breztri  once finished prednisone  rx      07/31/2021  :   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  Level 140   08/02/2022  Yearly f/u ov/Brandon Sanford re: GOLD 2 copd /MPNs with bronchiectasis maint on breztri   2bid   Chief Complaint  Patient presents with   Follow-up    States cough improved, DOE  Dyspnea:  walking new neighborhood  slt inclines doe uphill but mostly limited by L hip pain  Cough: none  Sleeping: flat bed one pillow/ no resp cc  SABA use: not needing  02: none  Covid status:   infected twice/ all vax plus RSV  Rec    06/30/2023  f/u ov/Brandon Sanford re: ***   maint on ***  No chief complaint on file.   Dyspnea:  *** Cough: *** Sleeping: *** resp cc  SABA use: *** 02: ***  Lung cancer screening :  ***    No obvious day to day or daytime variability or assoc excess/ purulent sputum or mucus plugs or hemoptysis or cp or chest tightness, subjective wheeze or overt sinus or hb symptoms.    Also denies any obvious fluctuation of symptoms with weather or environmental changes or other aggravating or alleviating factors except as outlined above   No unusual exposure hx or h/o childhood pna/ asthma or knowledge of premature birth.  Current Allergies, Complete Past Medical History, Past Surgical History,  Family History, and Social History were reviewed in [employer] record.  ROS  The following are not active complaints unless bolded Hoarseness, sore throat, dysphagia, dental problems, itching, sneezing,  nasal congestion or discharge of excess mucus or purulent secretions, ear ache,   fever, chills, sweats, unintended wt loss or wt gain, classically pleuritic or exertional cp,  orthopnea pnd or arm/hand swelling  or leg swelling, presyncope, palpitations, abdominal pain, anorexia, nausea, vomiting, diarrhea  or change in bowel habits or change in bladder habits, change in stools or change in urine, dysuria, hematuria,  rash, arthralgias, visual complaints, headache, numbness, weakness or ataxia or problems with walking or coordination,  change in mood or  memory.        No outpatient medications have been marked as taking for the 06/30/23 encounter (Appointment) with [name], MD.                Past Medical History:  Diagnosis Date   Allergy    Arthritis    Asthma    as a child   Cancer (HCC)    skin - neck  "pre- cancer"   COPD (chronic obstructive pulmonary disease) (HCC)    Depression    pt denies   Dyspnea    With exertion   GERD (gastroesophageal reflux disease)    tums if needed   Hyperlipidemia    Hypertension    Hypothyroidism    Inguinal hernia    Plantar fasciitis    Skin moles    Thyroid  disease    Umbilical hernia    repaired         Objective:     Wts  06/30/2023         ***  08/02/2022      178  07/31/2021      174  06/12/2021      174  01/23/2021         174   10/18/20 175 lb 6.4 oz (79.6 kg)  09/07/20 169 lb 8 oz (76.9 kg)  03/23/20 178 lb (80.7 kg)      Vital signs reviewed  06/30/2023  - Note at rest 02 sats  ***% on ***   General appearance:    ***   Min bar***  Assessment

## 2023-06-30 ENCOUNTER — Ambulatory Visit: Admitting: Internal Medicine

## 2023-06-30 ENCOUNTER — Encounter: Payer: Self-pay | Admitting: Internal Medicine

## 2023-06-30 DIAGNOSIS — J189 Pneumonia, unspecified organism: Secondary | ICD-10-CM | POA: Diagnosis not present

## 2023-06-30 DIAGNOSIS — J449 Chronic obstructive pulmonary disease, unspecified: Secondary | ICD-10-CM

## 2023-06-30 MED ORDER — ALBUTEROL SULFATE (2.5 MG/3ML) 0.083% IN NEBU: INHALATION_SOLUTION | RESPIRATORY_TRACT | 12 refills | Status: AC

## 2023-06-30 MED ORDER — ALBUTEROL SULFATE HFA 108 (90 BASE) MCG/ACT IN AERS: INHALATION_SPRAY | RESPIRATORY_TRACT | 6 refills | Status: AC

## 2023-06-30 NOTE — Patient Instructions (Signed)
 At onset of cough   1) mucinex  dm 1200 mg every 12 hours as needed for cough  and  congestion  2) Try prilosec otc 20mg   Take 30-60 min before first meal of the day and Pepcid  ac (famotidine ) 20 mg one @  bedtime until cough is completely gone for at least a week without the need for cough suppression     GERD (REFLUX)  is an extremely common cause of respiratory symptoms just like yours , many times with no obvious heartburn at all.    It can be treated with medication, but also with lifestyle changes including elevation of the head of your bed (ideally with 6 -8inch blocks under the headboard of your bed),  Smoking cessation, avoidance of late meals, excessive alcohol, and avoid fatty foods, chocolate, peppermint, colas, red wine, and acidic juices such as orange juice.  NO MINT OR MENTHOL  PRODUCTS SO NO COUGH DROPS except for ludens USE SUGARLESS CANDY INSTEAD (Jolley ranchers or [employer] or Environmental manager) or even ice chips will also do - the key is to swallow to prevent all throat clearing. NO OIL BASED VITAMINS - use powdered substitutes.  Avoid fish oil when coughing.   Plan A = Automatic = Always=   Breztri   Take 2 puffs first thing in am and then another 2 puffs about 12 hours later.     Plan B = Backup (to supplement plan A, not to replace it) Only use your levoalbuterol inhaler as a rescue medication to be used if you can't catch your breath by resting or doing a relaxed purse lip breathing pattern.  - The less you use it, the better it will work when you need it. - Ok to use the inhaler up to 2 puffs  every 4 hours if you must but call for appointment if use goes up over your usual need - Don't leave home without it !!  (think of it like the spare tire for your car)   Plan C = Crisis (instead of Plan B but only if Plan B stops working) - only use your levoalbuterol nebulizer if you first try Plan B and it fails to help > ok to use the nebulizer up to every 4 hours but if start  needing it regularly call for immediate appointment   Please schedule a follow up visit in 3 months but call sooner if needed

## 2023-06-30 NOTE — Assessment & Plan Note (Addendum)
 Quit smoking 1997  -  Bronchiectasis by CT 03/02/07 [facility] -   PFTs 06/03/07  FEV1 56% ratio 40% 27% improvement after bronchodilators and DLCO 87% -  Allergy profile 09/07/20 >  Eos 0.3 /  IgE  65 - 01/23/2021  After extensive coaching inhaler device,  effectiveness =    90% continue breztri   - PFT's  01/24/21   FEV1 1.79 (67 % ) ratio 0.48  p 15%improvement from saba p ? breztri  prior to study with DLCO  16.64 (72%)  FV curve classic concavity    07/31/2021  :    alpha one AT phenotype  MM  Level 140  - 08/02/2022    continue breztri   - 06/30/2023  After extensive coaching inhaler device,  effectiveness =    75% (short ti) > continue breztri  and prn saba  S//p acute flare of bronchitis vs bronchiectasis in setting of moderate underlying COPD c/w  Group D (now reclassified as E) in terms of symptom/risk and laba/lama/ICS  therefore appropriate rx at this point >>>  continue breztri  and appop saba:  Re SABA :  I spent extra time with pt today reviewing appropriate use of albuterol  for prn use on exertion with the following points: 1) saba is for relief of sob that does not improve by walking a slower pace or resting but rather if the pt does not improve after trying this first. 2) If the pt is convinced, as many are, that saba helps recover from activity faster then it's easy to tell if this is the case by re-challenging : ie stop, take the inhaler, then p 5 minutes try the exact same activity (intensity of workload) that just caused the symptoms and see if they are substantially diminished or not after saba 3) if there is an activity that reproducibly causes the symptoms, try the saba 15 min before the activity on alternate days   If in fact the saba really does help, then fine to continue to use it prn but advised may need to look closer at the maintenance regimen being used to achieve better control of airways disease with exertion.   Also reviewed action plan for aecopd/bronchiectasis flares which are very  likely to happen with future URIs  1) ABC action plan see avs for instructions unique to this ov   2) Try prilosec otc 20mg   Take 30-60 min before first meal of the day and Pepcid  ac (famotidine ) 20 mg one @  bedtime until cough is completely gone for at least a week without the need for cough suppression   3 ) max mucinex  dm   F/u @ 3 m, sooner prn          Each maintenance medication was reviewed in detail including emphasizing most importantly the difference between maintenance and prns and under what circumstances the prns are to be triggered using an action plan format where appropriate.  Total time for H and P, chart review, counseling, reviewing hfa/ neb device(s) and generating customized AVS unique to this office visit / same day charting = 45 min acute eval

## 2023-07-10 ENCOUNTER — Other Ambulatory Visit: Payer: Self-pay

## 2023-07-10 ENCOUNTER — Inpatient Hospital Stay (HOSPITAL_BASED_OUTPATIENT_CLINIC_OR_DEPARTMENT_OTHER)
Admission: EM | Admit: 2023-07-10 | Discharge: 2023-07-13 | DRG: 190 | Disposition: A | Discharge status: Home / Self Care | Attending: Internal Medicine | Admitting: Internal Medicine

## 2023-07-10 ENCOUNTER — Emergency Department (HOSPITAL_BASED_OUTPATIENT_CLINIC_OR_DEPARTMENT_OTHER)

## 2023-07-10 ENCOUNTER — Ambulatory Visit: Payer: Self-pay | Admitting: Internal Medicine

## 2023-07-10 ENCOUNTER — Encounter (HOSPITAL_BASED_OUTPATIENT_CLINIC_OR_DEPARTMENT_OTHER): Payer: Self-pay | Admitting: Emergency Medicine

## 2023-07-10 DIAGNOSIS — J471 Bronchiectasis with (acute) exacerbation: Secondary | ICD-10-CM | POA: Diagnosis present

## 2023-07-10 DIAGNOSIS — J9809 Other diseases of bronchus, not elsewhere classified: Secondary | ICD-10-CM | POA: Diagnosis present

## 2023-07-10 DIAGNOSIS — Z96642 Presence of left artificial hip joint: Secondary | ICD-10-CM | POA: Diagnosis present

## 2023-07-10 DIAGNOSIS — I129 Hypertensive chronic kidney disease with stage 1 through stage 4 chronic kidney disease, or unspecified chronic kidney disease: Secondary | ICD-10-CM | POA: Diagnosis present

## 2023-07-10 DIAGNOSIS — Z87891 Personal history of nicotine dependence: Secondary | ICD-10-CM

## 2023-07-10 DIAGNOSIS — R0600 Dyspnea, unspecified: Principal | ICD-10-CM

## 2023-07-10 DIAGNOSIS — Z8701 Personal history of pneumonia (recurrent): Secondary | ICD-10-CM

## 2023-07-10 DIAGNOSIS — N1831 Chronic kidney disease, stage 3a: Secondary | ICD-10-CM | POA: Diagnosis present

## 2023-07-10 DIAGNOSIS — Z7951 Long term (current) use of inhaled steroids: Secondary | ICD-10-CM | POA: Diagnosis not present

## 2023-07-10 DIAGNOSIS — E785 Hyperlipidemia, unspecified: Secondary | ICD-10-CM | POA: Diagnosis present

## 2023-07-10 DIAGNOSIS — E039 Hypothyroidism, unspecified: Secondary | ICD-10-CM | POA: Diagnosis present

## 2023-07-10 DIAGNOSIS — E43 Unspecified severe protein-calorie malnutrition: Secondary | ICD-10-CM | POA: Diagnosis present

## 2023-07-10 DIAGNOSIS — J441 Chronic obstructive pulmonary disease with (acute) exacerbation: Secondary | ICD-10-CM | POA: Diagnosis present

## 2023-07-10 DIAGNOSIS — I10 Essential (primary) hypertension: Secondary | ICD-10-CM | POA: Diagnosis present

## 2023-07-10 DIAGNOSIS — J9601 Acute respiratory failure with hypoxia: Secondary | ICD-10-CM | POA: Diagnosis present

## 2023-07-10 DIAGNOSIS — Z79899 Other long term (current) drug therapy: Secondary | ICD-10-CM

## 2023-07-10 DIAGNOSIS — Z6825 Body mass index (BMI) 25.0-25.9, adult: Secondary | ICD-10-CM

## 2023-07-10 DIAGNOSIS — Z79890 Hormone replacement therapy (postmenopausal): Secondary | ICD-10-CM

## 2023-07-10 DIAGNOSIS — R0602 Shortness of breath: Secondary | ICD-10-CM | POA: Diagnosis present

## 2023-07-10 DIAGNOSIS — Z888 Allergy status to other drugs, medicaments and biological substances status: Secondary | ICD-10-CM

## 2023-07-10 DIAGNOSIS — Z887 Allergy status to serum and vaccine status: Secondary | ICD-10-CM | POA: Diagnosis not present

## 2023-07-10 LAB — CBC WITH DIFFERENTIAL/PLATELET
Abs Immature Granulocytes: 0.06 10*3/uL (ref 0.00–0.07)
Basophils Absolute: 0 10*3/uL (ref 0.0–0.1)
Basophils Relative: 0 %
Eosinophils Absolute: 0.1 10*3/uL (ref 0.0–0.5)
Eosinophils Relative: 1 %
HCT: 43.2 % (ref 39.0–52.0)
Hemoglobin: 14.3 g/dL (ref 13.0–17.0)
Immature Granulocytes: 1 %
Lymphocytes Relative: 11 %
Lymphs Abs: 1.4 10*3/uL (ref 0.7–4.0)
MCH: 29.3 pg (ref 26.0–34.0)
MCHC: 33.1 g/dL (ref 30.0–36.0)
MCV: 88.5 fL (ref 80.0–100.0)
Monocytes Absolute: 1.1 10*3/uL — ABNORMAL HIGH (ref 0.1–1.0)
Monocytes Relative: 9 %
Neutro Abs: 10.2 10*3/uL — ABNORMAL HIGH (ref 1.7–7.7)
Neutrophils Relative %: 78 %
Platelets: 389 10*3/uL (ref 150–400)
RBC: 4.88 MIL/uL (ref 4.22–5.81)
RDW: 12.8 % (ref 11.5–15.5)
WBC: 12.9 10*3/uL — ABNORMAL HIGH (ref 4.0–10.5)
nRBC: 0 % (ref 0.0–0.2)

## 2023-07-10 LAB — COMPREHENSIVE METABOLIC PANEL WITH GFR
ALT: 18 U/L (ref 0–44)
AST: 15 U/L (ref 15–41)
Albumin: 3.9 g/dL (ref 3.5–5.0)
Alkaline Phosphatase: 103 U/L (ref 38–126)
Anion gap: 14 (ref 5–15)
BUN: 24 mg/dL — ABNORMAL HIGH (ref 8–23)
CO2: 24 mmol/L (ref 22–32)
Calcium: 10.2 mg/dL (ref 8.9–10.3)
Chloride: 101 mmol/L (ref 98–111)
Creatinine, Ser: 1.39 mg/dL — ABNORMAL HIGH (ref 0.61–1.24)
GFR, Estimated: 51 mL/min — ABNORMAL LOW (ref >60)
Glucose, Bld: 109 mg/dL — ABNORMAL HIGH (ref 70–99)
Potassium: 3.9 mmol/L (ref 3.5–5.1)
Sodium: 138 mmol/L (ref 135–145)
Total Bilirubin: 0.5 mg/dL (ref 0.0–1.2)
Total Protein: 7.1 g/dL (ref 6.5–8.1)

## 2023-07-10 MED ORDER — SODIUM CHLORIDE 0.9 % IV SOLN
500.0000 mg | Freq: Once | INTRAVENOUS | Status: AC
  Administered 2023-07-10: 500 mg via INTRAVENOUS
  Filled 2023-07-10: qty 5

## 2023-07-10 MED ORDER — SODIUM CHLORIDE 0.9 % IV BOLUS
1000.0000 mL | Freq: Once | INTRAVENOUS | Status: AC
  Administered 2023-07-10: 1000 mL via INTRAVENOUS

## 2023-07-10 MED ORDER — IOHEXOL 350 MG/ML SOLN
100.0000 mL | Freq: Once | INTRAVENOUS | Status: AC | PRN
  Administered 2023-07-10: 75 mL via INTRAVENOUS

## 2023-07-10 MED ORDER — IPRATROPIUM-ALBUTEROL 0.5-2.5 (3) MG/3ML IN SOLN
3.0000 mL | Freq: Once | RESPIRATORY_TRACT | Status: AC
  Administered 2023-07-10: 3 mL via RESPIRATORY_TRACT
  Filled 2023-07-10: qty 3

## 2023-07-10 MED ORDER — SODIUM CHLORIDE 0.9 % IV SOLN
1.0000 g | Freq: Once | INTRAVENOUS | Status: AC
  Administered 2023-07-10: 1 g via INTRAVENOUS
  Filled 2023-07-10: qty 10

## 2023-07-10 NOTE — ED Triage Notes (Signed)
 Pt with worsening SHOB since about 6/5; dx with PNA (has completed abx and steroids); has decreased appetite

## 2023-07-10 NOTE — ED Notes (Signed)
1 set of cultures and bloodwork sent to lab.

## 2023-07-10 NOTE — Progress Notes (Signed)
   PCCM transfer request    Sending physician: Florie Husband- EDP  Sending facility: [facility] ED  Reason for transfer: pulmonary consult- bronchiectasis AE  Brief case summary: Bronchiectisis, mucus plugging Lymphadenopathy New oxygen requirement- 2L, can only walk about 10 feet right now Seen at [facility] in early June, normal CXR- prescribed Augmentin  & steroids then. CT with mucus plugging  Recommendations made prior to transfer:   Transfer accepted: no-- recommend admission to [facility] with pulmonary consult  Let PCCM know tomorrow if he comes overnight. Recommend frequent nebs, flutter valve, sputum culture, broad spectrum antibiotics since he has failed an OP regimen.    Joesph Mussel 07/10/23 6:01 PM [facility]  For contact information, see Amion. If no response to pager, please call PCCM consult pager. After hours, 7PM- 7AM, please call Elink.

## 2023-07-10 NOTE — ED Notes (Signed)
 2L O2 applied per Dr. Florie Husband. Pt tolerating without issues.

## 2023-07-10 NOTE — Telephone Encounter (Signed)
 Copied from CRM [id number]. Topic: Appointments - Scheduling Inquiry for Clinic >> Jul 10, 2023 11:38 AM Crist Dominion wrote: Reason for CRM: Patients wife Wing Hauser) is requesting for any doctor or provider to see the patient today as he has had pneumonia for over 3 weeks and is not getting better. Kittly is also asking if no one can see he patient can he please be prescribed some antibiotics or prednisone ? Please call Kitty back and advise at [phone] [facility] spoke with nurse triage already and denied to call EMS.

## 2023-07-10 NOTE — H&P (Signed)
 History and Physical    BUSH MURDOCH UEA:[id number] DOB: 1940-07-21 DOA: 07/10/2023  Patient coming from: Home.  Chief Complaint: Shortness of breath.  HPI: Brandon Sanford is a 83 y.o. male with history of COPD/bronchiectasis, hypertension, hyperlipidemia, hypothyroidism presents to the ER with worsening shortness of breath.  Patient had come to the urgent care on 06/26/2023 with symptoms of bronchitis was prescribed Augmentin  and prednisone  and followed up with patient's pulmonologist Dr. Waymond Hailey on 06/30/2023 when patient's inhalers were changed and Prilosec was added.  Patient's shortness of breath was worsening patient presents to the ER today.  Denies chest pain fever or chills.  Has been having productive cough.  ED Course: In the ER patient is found to be diffusely wheezing.  Has required 2 L oxygen.  CT chest was negative for PE but does show some mucous plugging and also near occlusion of the right upper lobe bronchus.  Patient was placed on antibiotics and steroids and nebulizer admitted for COPD exacerbation.  Labs show WBC of 12.9 creatinine 1.3.  EKG shows normal sinus rhythm.  Review of Systems: As per HPI, rest all negative.   Past Medical History:  Diagnosis Date   Allergy    Arthritis    Asthma    as a child   Cancer (HCC)    skin - neck  pre- cancer   COPD (chronic obstructive pulmonary disease) (HCC)    Depression    pt denies   Dyspnea    With exertion   GERD (gastroesophageal reflux disease)    tums if needed   Hyperlipidemia    Hypertension    Hypothyroidism    Inguinal hernia    Plantar fasciitis    Skin moles    Thyroid  disease    Umbilical hernia    repaired    Past Surgical History:  Procedure Laterality Date   COLONOSCOPY     COLONOSCOPY W/ POLYPECTOMY     HERNIA REPAIR  11/26/10   umbilical hernia and LIH repair    INGUINAL HERNIA REPAIR  11/26/2010   Procedure: HERNIA REPAIR INGUINAL ADULT;  Surgeon: Cloyce Darby, MD;  Location: [facility];  Service: General;  Laterality: Left;  left inguinal hernia repair with mesh   INGUINAL HERNIA REPAIR Right 02/04/2018   Procedure: REPAIR OF RIGHT INGUINAL HERNIA WITH MESH;  Surgeon: Dorena Gander, MD;  Location: [facility] OR;  Service: General;  Laterality: Right;   INGUINAL HERNIA REPAIR Right 10/16/2018   Procedure: LAPAROSCOPIC RIGHT INGUINAL HERNIA;  Surgeon: Shela Derby, MD;  Location: [facility] OR;  Service: General;  Laterality: Right;   INSERTION OF MESH Right 02/04/2018   Procedure: INSERTION OF MESH;  Surgeon: Dorena Gander, MD;  Location: [facility] OR;  Service: General;  Laterality: Right;   INSERTION OF MESH N/A 10/16/2018   Procedure: Insertion Of Mesh;  Surgeon: Shela Derby, MD;  Location: [facility] OR;  Service: General;  Laterality: N/A;   TONSILLECTOMY     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 His smoking use included cigarettes. He started smoking about  67 years ago. He has never used smokeless tobacco. He reports current alcohol use of about 2.0 standard drinks of alcohol per week. He reports that he does not use drugs.  Allergies  Allergen Reactions   Lisinopril Cough   Chlorhexidine  Rash    Pre-surgery soap   Pneumococcal Vaccines Rash    Family History  Problem Relation Age of Onset   Colon cancer Neg Hx    Colon polyps Neg Hx    Esophageal cancer Neg Hx    Stomach cancer Neg Hx    Rectal cancer Neg Hx     Prior to Admission medications   Medication Sig Start Date End Date Taking? Authorizing Provider  albuterol  (PROVENTIL ) (2.5  MG/3ML) 0.083% nebulizer solution Use one half to one vial every 4 hours as needed Patient taking differently: Take 2.5 mg by nebulization every 6 (six) hours as needed for shortness of breath. 06/30/23  Yes [name], MD  albuterol  (VENTOLIN  HFA) 108 (90 Base) MCG/ACT inhaler 1- 2puffs every 4 hours if needed Patient taking differently: Inhale 1 puff into the lungs every 6 (six) hours as needed for shortness of breath. 1- 2puffs every 4 hours if needed 06/30/23  Yes [name], MD  amLODipine  (NORVASC ) 5 MG tablet Take 5 mg by mouth daily.   Yes [provider]  Budeson-Glycopyrrol-Formoterol  (BREZTRI  AEROSPHERE) 160-9-4.8 MCG/ACT AERO Inhale 2 puffs into the lungs in the morning and at bedtime. 12/20/21  Yes [name], MD  hydrochlorothiazide (MICROZIDE) 12.5 MG capsule Take 12.5 mg by mouth daily.   Yes [provider]  levothyroxine  (SYNTHROID , LEVOTHROID) 100 MCG tablet Take 100 mcg by mouth daily before breakfast.   Yes [provider]  menthol -cetylpyridinium (CEPACOL) 3 MG lozenge Take 1 lozenge by mouth daily as needed for sore throat.   Yes [provider]  Multiple Vitamin (MULTIVITAMIN) tablet Take 1 tablet by mouth daily.   Yes [provider]  Polyethyl Glycol-Propyl Glycol 0.4-0.3 % SOLN Place 1-2 drops into both eyes 3 (three) times daily as needed (dry eyes/irritated eyes).   Yes [provider]  simvastatin  (ZOCOR ) 40 MG tablet Take 40 mg by mouth every evening.    Yes [provider]  TUMS 500 MG chewable tablet Chew 500 mg by mouth 2 (two) times daily as needed for indigestion or heartburn. 04/19/17  Yes [provider]  amoxicillin -clavulanate (AUGMENTIN ) 875-125 MG tablet Take 1 tablet by mouth every 12 (twelve) hours. Patient not taking: Reported on 07/10/2023 06/26/23   [name], NP    Physical Exam: Constitutional: Moderately built and nourished. Vitals:   07/10/23 1930 07/10/23 2108  07/10/23 2313 07/10/23 2319  BP: (!) 144/89 128/67 113/70   Pulse: 99 92 75   Resp: 20 (!) 24 (!) 28 (!) 24  Temp:  98.4 F (36.9 C) 98.6 F (37 C)   TempSrc:  Oral Oral   SpO2: 100% 98% 99%   Weight:      Height:       Eyes: Anicteric no pallor. ENMT: No discharge from the ears/nose or mouth. Neck: No mass felt.  No neck rigidity. Respiratory: Bilateral expiratory wheezes are no crepitations. Cardiovascular: S1-S2 heard. Abdomen: Soft nontender bowel sound present. Musculoskeletal: No edema. Skin: No rash. Neurologic: Alert awake oriented to time place and person.  Moves all extremities. Psychiatric: Appears normal.  Normal affect.   Labs on Admission: I have personally reviewed following labs and imaging studies  CBC: Recent Labs  Lab 07/10/23 1456  WBC 12.9*  NEUTROABS 10.2*  HGB 14.3  HCT 43.2  MCV 88.5  PLT 389   Basic Metabolic Panel: Recent Labs  Lab 07/10/23 1456  NA 138  K 3.9  CL 101  CO2 24  GLUCOSE 109*  BUN 24*  CREATININE 1.39*  CALCIUM  10.2   GFR: Estimated Creatinine Clearance: 41 mL/min (A) (by C-G formula based on SCr of 1.39 mg/dL (H)). Liver Function Tests: Recent Labs  Lab 07/10/23 1456  AST 15  ALT 18  ALKPHOS 103  BILITOT 0.5  PROT 7.1  ALBUMIN 3.9   No results for input(s): LIPASE, AMYLASE in the last 168 hours. No results for input(s): AMMONIA in the last 168 hours. Coagulation Profile: No results for input(s): INR, PROTIME in the last 168 hours. Cardiac Enzymes: No results for input(s): CKTOTAL, CKMB, CKMBINDEX, TROPONINI in the last 168 hours. BNP (last 3 results) No results for input(s): PROBNP in the last 8760 hours. HbA1C: No results for input(s): HGBA1C in the last 72 hours. CBG: No results for input(s): GLUCAP in the last 168 hours. Lipid Profile: No results for input(s): CHOL, HDL, LDLCALC, TRIG, CHOLHDL, LDLDIRECT in the last 72 hours. Thyroid  Function Tests: No results  for input(s): TSH, T4TOTAL, FREET4, T3FREE, THYROIDAB in the last 72 hours. Anemia Panel: No results for input(s): VITAMINB12, FOLATE, FERRITIN, TIBC, IRON, RETICCTPCT in the last 72 hours. Urine analysis:    Component Value Date/Time   COLORURINE YELLOW 11/26/2021 2214   APPEARANCEUR CLEAR 11/26/2021 2214   LABSPEC 1.017 11/26/2021 2214   PHURINE 6.0 11/26/2021 2214   GLUCOSEU NEGATIVE 11/26/2021 2214   HGBUR NEGATIVE 11/26/2021 2214   BILIRUBINUR NEGATIVE 11/26/2021 2214   KETONESUR NEGATIVE 11/26/2021 2214   PROTEINUR NEGATIVE 11/26/2021 2214   NITRITE NEGATIVE 11/26/2021 2214   LEUKOCYTESUR NEGATIVE 11/26/2021 2214   Sepsis Labs: @LABRCNTIP (procalcitonin:4,lacticidven:4) )No results found for this or any previous visit (from the past 240 hours).   Radiological Exams on Admission: CT Angio Chest PE W and/or Wo Contrast Result Date: 07/10/2023 CLINICAL DATA:  Worsening shortness of breath EXAM: CT ANGIOGRAPHY CHEST WITH CONTRAST TECHNIQUE: Multidetector CT imaging of the chest was performed using the standard protocol during bolus administration of intravenous contrast. Multiplanar CT image reconstructions and MIPs were obtained to evaluate the vascular anatomy. RADIATION DOSE REDUCTION: This exam was performed according to the departmental dose-optimization program which includes automated exposure control, adjustment of the mA and/or kV according to patient size and/or use of iterative reconstruction technique. CONTRAST:  75mL OMNIPAQUE IOHEXOL 350 MG/ML SOLN COMPARISON:  08/21/2021 and chest radiograph 07/09/2021 FINDINGS: Cardiovascular: No filling defect is identified in the pulmonary arterial tree to suggest pulmonary embolus. Coronary, aortic arch, and branch vessel atherosclerotic vascular disease. Mediastinum/Nodes: Prevascular node 1.1 cm in short axis on image 28 series 302, formerly the same. Right hilar node 1.1 cm in short axis on image 66 series 302,  formerly 1.0 cm. Left hilar node 1.2 cm in short axis on image 68 series 302, difficult to measure on prior exam due to lack of IV contrast but suspected 1.2 cm (stable). Small type 1 hiatal hernia. Lungs/Pleura: Emphysema. Airway thickening and mild airway plugging in both lower lobes with mild associated atelectasis. There is also some mucous plugging and atelectasis in the lingula. Peribronchovascular nodularity in the left lower lobe is probably from infrahilar lymph nodes. Near occlusion of the right upper lobe bronchus due to nodularity and bronchial wall thickening on images 59-63 of series 303. Upper  Abdomen: Bilateral fluid density renal cysts along the upper poles. Atheromatous plaque at the origins of the celiac trunk and SMA. There is some cephalad extension of small bowel loops anterior to the left hepatic lobe. Musculoskeletal: Thoracic spondylosis. Review of the MIP images confirms the above findings. IMPRESSION: 1. No filling defect is identified in the pulmonary arterial tree to suggest pulmonary embolus. 2. Airway thickening and mild airway plugging in both lower lobes with mild associated atelectasis. There is also some mucous plugging and atelectasis in the lingula. 3. Near occlusion of the right upper lobe bronchus due to nodularity and bronchial wall thickening. Bronchoscopy or short-term follow up CT scan is recommended to exclude underlying neoplasm. 4. Peribronchovascular nodularity in the left lower lobe is probably from infrahilar lymph nodes. 5. Mild mediastinal and hilar adenopathy, roughly similar to 08/21/2021, possibly reactive but technically nonspecific. 6. Small type 1 hiatal hernia. 7. Bilateral fluid density renal cysts along the upper poles. 8. Thoracic spondylosis. 9. Aortic Atherosclerosis (ICD10-I70.0) and Emphysema (ICD10-J43.9). Electronically Signed   By: Freida Jes M.D.   On: 07/10/2023 17:01   DG Chest 1 View Result Date: 07/10/2023 CLINICAL DATA:  Chest  tightness. EXAM: CHEST  1 VIEW COMPARISON:  June 26, 2023 FINDINGS: The heart size and mediastinal contours are within normal limits. Mild linear scarring and/or atelectasis is seen within the bilateral lung bases. There is no evidence of focal consolidation, pleural effusion or pneumothorax. Multilevel degenerative changes seen throughout the thoracic spine. IMPRESSION: Mild bibasilar linear scarring and/or atelectasis. Electronically Signed   By: Virgle Grime M.D.   On: 07/10/2023 16:09    EKG: Independently reviewed.  Normal sinus rhythm.  Assessment/Plan Principal Problem:   COPD exacerbation (HCC) Active Problems:   Hypothyroidism   Essential hypertension    COPD exacerbation/bronchiectasis presently on 2 L oxygen will keep patient on IV Solu-Medrol  Pulmicort DuoNebs as needed and scheduled and antibiotics.  Check respiratory viral panel.  Sputum cultures. Near occlusion of right upper lobe bronchus will need pulmonology follow-up.  Will discuss with pulmonologist. Hypertension on amlodipine  and hydrochlorothiazide. Hyperlipidemia on statins. Hypothyroidism on Synthroid . Chronic kidney disease stage III creatinine around baseline.  Since patient has COPD exacerbation will need close monitoring further workup and more than 2 midnight stay.   DVT prophylaxis: Lovenox . Code Status: Full code. Family Communication: Discussed with patient. Disposition Plan: Medical floor. Consults called: Will discuss with pulmonologist. Admission status: Observation.

## 2023-07-10 NOTE — Telephone Encounter (Signed)
 FYI Only or Action Required?: Action required by provider: refusing hospital, needs call back.  Patient is followed in Pulmonology for COPD, last seen on 06/30/2023 by [name], MD. Called Nurse Triage reporting Shortness of Breath, Chest Pain, Fatigue, Dizziness, and Cough. Symptoms began several weeks ago. Interventions attempted: Rescue inhaler and Maintenance inhaler. Symptoms are: rapidly worsening.  Triage Disposition: Call EMS 911 Now  Patient/caregiver understands and will follow disposition?: No, refuses disposition      Copied from CRM [id number]. Topic: Clinical - Red Word Triage >> Jul 10, 2023  8:31 AM Crist Dominion wrote: Red Word that prompted transfer to Nurse Triage: Hard time breathing, can't walk around without needing to rest. Reason for Disposition  [1] Chest pain lasts > 5 minutes AND [2] age > 75  Answer Assessment - Initial Assessment Questions E2C2 Pulmonary Triage - Initial Assessment Questions Chief Complaint (e.g., cough, sob, wheezing, fever, chills, sweat or additional symptoms) *Go to specific symptom protocol after initial questions. Worse SOB when walking around Cough a little bit Can't walk without resting a while No chest pain, just feel like it's heavy or pressure, constant, not my usual with SOB No fever Cough something up at times, sputum sometimes a little bit cloudy Feel weaker, don't eat much Tight cough On occasion a little bit dizzy, not like about to pass out  How long have symptoms been present? 2-3 weeks  Have you used your inhalers/maintenance medication? Yes If yes, What medications? Used everything he gave me, breztri  twice/day, rescue inhaler  If inhaler, ask How many puffs and how often? Note: Review instructions on medication in the chart. Rescue inhaler not used it today, think used yesterday, does help  OXYGEN: Do you wear supplemental oxygen? No  Do you monitor your oxygen levels? Yes If yes, What is  your reading (oxygen level) today? Not checked today  What is your usual oxygen saturation reading?  (Note: Pulmonary O2 sats should be 90% or greater) 93-95%  6. CARDIAC HISTORY: Do you have any history of heart disease? (e.g., heart attack, angina, bypass surgery, angioplasty)      HTN  7. LUNG HISTORY: Do you have any history of lung disease?  (e.g., pulmonary embolus, asthma, emphysema)     significant  Advised hospital with constant chest pressure, weakness, and dizziness accompanying the SOB. Refusing hospital, sending message to pulm. Advised go to hospital if worsening symptoms, advised use rescue inhaler today. Refusing ED  Protocols used: Breathing Difficulty-A-AH, Chest Pain-A-AH

## 2023-07-10 NOTE — Telephone Encounter (Signed)
 Appears patient is in the emergency room.  Did recommend that we call him and get him scheduled for a follow-up visit for further evaluation.

## 2023-07-10 NOTE — ED Notes (Signed)
 PT Ambulated to bathroom with cane. O2 removed while pt went to bathroom. Pt back in bed hooked up to O2 with call light in reach.

## 2023-07-10 NOTE — ED Provider Notes (Signed)
 [name] Provider Note  CSN: [id number] Arrival date & time: 07/10/23 1435  Chief Complaint(s) Shortness of Breath  HPI Brandon Sanford is a 83 y.o. male who is here today for shortness of breath and cough of several weeks duration.  Patient was diagnosed with pneumonia at an urgent care, discharged with Augmentin  and prednisone .  Symptoms persisted, he followed up with his pulmonologist several days later and appeared to be doing better at that time.  Patient reports that he has only had 1 day during this stretch where he has felt improved.  He had a fever earlier in the week, no fever this past evening.  He has a history of COPD, not currently on any home oxygen.   Past Medical History Past Medical History:  Diagnosis Date   Allergy    Arthritis    Asthma    as a child   Cancer (HCC)    skin - neck  pre- cancer   COPD (chronic obstructive pulmonary disease) (HCC)    Depression    pt denies   Dyspnea    With exertion   GERD (gastroesophageal reflux disease)    tums if needed   Hyperlipidemia    Hypertension    Hypothyroidism    Inguinal hernia    Plantar fasciitis    Skin moles    Thyroid  disease    Umbilical hernia    repaired   Patient Active Problem List   Diagnosis Date Noted   COPD exacerbation (HCC) 07/10/2023   Stage 3a chronic kidney disease (CKD) (HCC) 11/26/2021   Pneumonia 11/26/2021   COVID-19 virus infection 11/26/2021   Medication reaction 12/29/2020   History of tobacco abuse 12/29/2020   GERD without esophagitis 12/29/2020   Allergic rhinitis with postnasal drip 12/29/2020   Tachycardia 09/07/2020   CAP (community acquired pneumonia) 09/07/2020   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 Authorizing Provider  albuterol  (PROVENTIL ) (2.5 MG/3ML) 0.083% nebulizer solution Use one half to one vial every 4 hours as needed Patient taking differently: Take 2.5 mg by nebulization every 6 (six) hours as needed for shortness of breath. 06/30/23  Yes [name], MD  albuterol  (VENTOLIN  HFA) 108 (90 Base) MCG/ACT inhaler 1- 2puffs every 4 hours if needed Patient taking differently: Inhale 1 puff into the lungs every 6 (six) hours as needed for shortness of breath. 1- 2puffs every 4 hours if needed 06/30/23  Yes [name], MD  amLODipine  (NORVASC ) 5 MG tablet Take 5 mg by mouth daily.   Yes [provider]  Budeson-Glycopyrrol-Formoterol  (BREZTRI  AEROSPHERE) 160-9-4.8 MCG/ACT AERO Inhale 2 puffs into the lungs in the morning and at bedtime. 12/20/21  Yes [name], MD  hydrochlorothiazide (MICROZIDE) 12.5 MG capsule Take 12.5 mg by mouth daily.   Yes [provider]  levothyroxine  (SYNTHROID , LEVOTHROID) 100 MCG tablet Take 100 mcg by mouth daily before breakfast.   Yes [provider]  menthol -cetylpyridinium (CEPACOL) 3 MG lozenge Take 1 lozenge by mouth daily as needed for sore throat.   Yes [provider]  Multiple  Vitamin (MULTIVITAMIN) tablet Take 1 tablet by mouth daily.   Yes [provider]  Polyethyl Glycol-Propyl Glycol 0.4-0.3 % SOLN Place 1-2 drops into both eyes 3 (three) times daily as needed (dry eyes/irritated eyes).   Yes [provider]  simvastatin  (ZOCOR ) 40 MG tablet Take 40 mg by mouth every evening.    Yes [provider]  TUMS 500 MG chewable tablet Chew 500 mg by mouth 2 (two) times daily as needed for indigestion or heartburn. 04/19/17  Yes [provider]  amoxicillin -clavulanate (AUGMENTIN ) 875-125 MG tablet Take 1  tablet by mouth every 12 (twelve) hours. Patient not taking: Reported on 07/10/2023 06/26/23   [name], NP                                                                                                                                    Past Surgical History Past Surgical History:  Procedure Laterality Date   COLONOSCOPY     COLONOSCOPY W/ POLYPECTOMY     HERNIA REPAIR  11/26/10   umbilical hernia and LIH repair    INGUINAL HERNIA REPAIR  11/26/2010   Procedure: HERNIA REPAIR INGUINAL ADULT;  Surgeon: Cloyce Darby, MD;  Location: [facility];  Service: General;  Laterality: Left;  left inguinal hernia repair with mesh   INGUINAL HERNIA REPAIR Right 02/04/2018   Procedure: REPAIR OF RIGHT INGUINAL HERNIA WITH MESH;  Surgeon: Dorena Gander, MD;  Location: [facility] OR;  Service: General;  Laterality: Right;   INGUINAL HERNIA REPAIR Right 10/16/2018   Procedure: LAPAROSCOPIC RIGHT INGUINAL HERNIA;  Surgeon: Shela Derby, MD;  Location: [facility] OR;  Service: General;  Laterality: Right;   INSERTION OF MESH Right 02/04/2018   Procedure: INSERTION OF MESH;  Surgeon: Dorena Gander, MD;  Location[facility];  Service: General;  Laterality: Right;   INSERTION OF MESH N/A 10/16/2018   Procedure: Insertion Of Mesh;  Surgeon: Shela Derby, MD;  Location: [facility] OR;  Service: General;  Laterality: N/A;   TONSILLECTOMY     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   Procedure: LEFT TOTAL HIP ARTHROPLASTY ANTERIOR APPROACH;  Surgeon: Arnie Lao, MD;  Location: [facility] OR;  Service: Orthopedics;  Laterality: Left;   UMBILICAL HERNIA REPAIR  11/26/2010   Procedure: HERNIA REPAIR UMBILICAL ADULT;  Surgeon: Cloyce Darby, MD;  Location: [facility];  Service: General;  Laterality: N/A;  umbilical hernia repair with mesh   Family History Family History  Problem Relation Age of Onset   Colon cancer Neg Hx    Colon polyps Neg Hx    Esophageal cancer Neg Hx    Stomach cancer Neg  Hx    Rectal cancer Neg Hx     Social History Social History   Tobacco Use   Smoking status: Former    Current packs/day: 0.00    Types: Cigarettes    Start date: 11/12/1955  Quit date: 11/12/1995    Years since quitting: 27.6   Smokeless tobacco: Never  Vaping Use   Vaping status: Never Used  Substance Use Topics   Alcohol use: Yes    Alcohol/week: 2.0 standard drinks of alcohol    Types: 2 Cans of beer per week   Drug use: No   Allergies Lisinopril, Chlorhexidine , and Pneumococcal vaccines  Review of Systems Review of Systems  Physical Exam Vital Signs  I have reviewed the triage vital signs BP 128/67 (BP Location: Right Arm)   Pulse 92   Temp 98.4 F (36.9 C) (Oral)   Resp (!) 24   Ht 5' 9 (1.753 m)   Wt 78.5 kg   SpO2 98%   BMI 25.55 kg/m   Physical Exam Vitals and nursing note reviewed.  Pulmonary:     Breath sounds: Examination of the right-middle field reveals wheezing. Examination of the left-middle field reveals wheezing. Examination of the right-lower field reveals wheezing. Examination of the left-lower field reveals wheezing. Wheezing present. No decreased breath sounds, rhonchi or rales.  Chest:     Chest wall: No mass.   Musculoskeletal:        General: Normal range of motion.   Neurological:     Mental Status: He is alert.     ED Results and Treatments Labs (all labs ordered are listed, but only abnormal results are displayed) Labs Reviewed  COMPREHENSIVE METABOLIC PANEL WITH GFR - Abnormal; Notable for the following components:      Result Value   Glucose, Bld 109 (*)    BUN 24 (*)    Creatinine, Ser 1.39 (*)    GFR, Estimated 51 (*)    All other components within normal limits  CBC WITH DIFFERENTIAL/PLATELET - Abnormal; Notable for the following components:   WBC 12.9 (*)    Neutro Abs 10.2 (*)    Monocytes Absolute 1.1 (*)    All other components within normal limits                                                                                                                           Radiology CT Angio Chest PE W and/or Wo Contrast Result Date: 07/10/2023 CLINICAL DATA:  Worsening shortness of breath EXAM: CT ANGIOGRAPHY CHEST WITH CONTRAST TECHNIQUE: Multidetector CT imaging of the chest was performed using the standard protocol during bolus administration of intravenous contrast. Multiplanar CT image reconstructions and MIPs were obtained to evaluate the vascular anatomy. RADIATION DOSE REDUCTION: This exam was performed according to the departmental dose-optimization program which includes automated exposure control, adjustment of the mA and/or kV according to patient size and/or use of iterative reconstruction technique. CONTRAST:  75mL OMNIPAQUE IOHEXOL 350 MG/ML SOLN COMPARISON:  08/21/2021 and chest radiograph 07/09/2021 FINDINGS: Cardiovascular: No filling defect is identified in the pulmonary arterial tree to suggest pulmonary embolus. Coronary, aortic arch, and branch vessel atherosclerotic vascular disease. Mediastinum/Nodes: Prevascular node 1.1 cm in short axis on image 28  series 302, formerly the same. Right hilar node 1.1 cm in short axis on image 66 series 302, formerly 1.0 cm. Left hilar node 1.2 cm in short axis on image 68 series 302, difficult to measure on prior exam due to lack of IV contrast but suspected 1.2 cm (stable). Small type 1 hiatal hernia. Lungs/Pleura: Emphysema. Airway thickening and mild airway plugging in both lower lobes with mild associated atelectasis. There is also some mucous plugging and atelectasis in the lingula. Peribronchovascular nodularity in the left lower lobe is probably from infrahilar lymph nodes. Near occlusion of the right upper lobe bronchus due to nodularity and bronchial wall thickening on images 59-63 of series 303. Upper Abdomen: Bilateral fluid density renal cysts along the upper poles. Atheromatous plaque at the origins of the celiac trunk and SMA. There is some  cephalad extension of small bowel loops anterior to the left hepatic lobe. Musculoskeletal: Thoracic spondylosis. Review of the MIP images confirms the above findings. IMPRESSION: 1. No filling defect is identified in the pulmonary arterial tree to suggest pulmonary embolus. 2. Airway thickening and mild airway plugging in both lower lobes with mild associated atelectasis. There is also some mucous plugging and atelectasis in the lingula. 3. Near occlusion of the right upper lobe bronchus due to nodularity and bronchial wall thickening. Bronchoscopy or short-term follow up CT scan is recommended to exclude underlying neoplasm. 4. Peribronchovascular nodularity in the left lower lobe is probably from infrahilar lymph nodes. 5. Mild mediastinal and hilar adenopathy, roughly similar to 08/21/2021, possibly reactive but technically nonspecific. 6. Small type 1 hiatal hernia. 7. Bilateral fluid density renal cysts along the upper poles. 8. Thoracic spondylosis. 9. Aortic Atherosclerosis (ICD10-I70.0) and Emphysema (ICD10-J43.9). Electronically Signed   By: Freida Jes M.D.   On: 07/10/2023 17:01   DG Chest 1 View Result Date: 07/10/2023 CLINICAL DATA:  Chest tightness. EXAM: CHEST  1 VIEW COMPARISON:  June 26, 2023 FINDINGS: The heart size and mediastinal contours are within normal limits. Mild linear scarring and/or atelectasis is seen within the bilateral lung bases. There is no evidence of focal consolidation, pleural effusion or pneumothorax. Multilevel degenerative changes seen throughout the thoracic spine. IMPRESSION: Mild bibasilar linear scarring and/or atelectasis. Electronically Signed   By: Virgle Grime M.D.   On: 07/10/2023 16:09    Pertinent labs & imaging results that were available during my care of the patient were reviewed by me and considered in my medical decision making (see MDM for details).  Medications Ordered in ED Medications  ipratropium-albuterol  (DUONEB) 0.5-2.5 (3)  MG/3ML nebulizer solution 3 mL (3 mLs Nebulization Given 07/10/23 1523)  cefTRIAXone  (ROCEPHIN ) 1 g in sodium chloride  0.9 % 100 mL IVPB (0 g Intravenous Stopped 07/10/23 1601)  azithromycin  (ZITHROMAX ) 500 mg in sodium chloride  0.9 % 250 mL IVPB (0 mg Intravenous Stopped 07/10/23 1738)  sodium chloride  0.9 % bolus 1,000 mL (0 mLs Intravenous Stopped 07/10/23 1714)  iohexol (OMNIPAQUE) 350 MG/ML injection 100 mL (75 mLs Intravenous Contrast Given 07/10/23 1627)  Procedures Procedures  (including critical care time)  Medical Decision Making / ED Course   This patient presents to the ED for concern of cough and shortness of breath, this involves an extensive number of treatment options, and is a complaint that carries with it a high risk of complications and morbidity.  The differential diagnosis includes pneumonia, malignancy, less likely pulmonary emboli, COPD exacerbation, less include pulmonary edema.  MDM: Suspicion for the patient is an atypical pneumonia.  Will obtain a chest x-ray.  Will expand the patient's antibiotic coverage.  If chest x-ray does not reveal obvious pneumonia, would proceed with CT imaging.  Anticipate patient to have a leukocytosis given recent prednisone  use.  Overall patient looks pretty good.  This patient is not of sepsis.  Patient tells me his goal would be able to go home.  Discussed with the patient that if he has a reassuring workup, and he is able to ambulate without significant desaturation, he may be a candidate to go home with expanded coverage.  Reassessment-5 PM-patient CTA shows significant mucous plugging, partial bronchial obstruction.  Spoke with pulmonology who agreed with admission.  Admitted to hospitalist   Additional history obtained: -Additional history obtained from wife at bedside -External records from outside  source obtained and reviewed including: Chart review including previous notes, labs, imaging, consultation notes   Lab Tests: -I ordered, reviewed, and interpreted labs.   The pertinent results include:   Labs Reviewed  COMPREHENSIVE METABOLIC PANEL WITH GFR - Abnormal; Notable for the following components:      Result Value   Glucose, Bld 109 (*)    BUN 24 (*)    Creatinine, Ser 1.39 (*)    GFR, Estimated 51 (*)    All other components within normal limits  CBC WITH DIFFERENTIAL/PLATELET - Abnormal; Notable for the following components:   WBC 12.9 (*)    Neutro Abs 10.2 (*)    Monocytes Absolute 1.1 (*)    All other components within normal limits      EKG my independent review of the patient's EKG shows no ST segment depressions or elevations, no T wave inversions, no evidence of acute ischemia.  EKG Interpretation Date/Time:  Thursday July 10 2023 14:50:07 EDT Ventricular Rate:  98 PR Interval:  152 QRS Duration:  82 QT Interval:  334 QTC Calculation: 427 R Axis:   118  Text Interpretation: Sinus rhythm Atrial premature complex Left posterior fascicular block Abnormal R-wave progression, late transition Confirmed by Afton Horse [phone]) on 07/10/2023 10:50:13 PM         Imaging Studies ordered: I ordered imaging studies including chest x-ray, CT chest I independently visualized and interpreted imaging. I agree with the radiologist interpretation   Medicines ordered and prescription drug management: Meds ordered this encounter  Medications   ipratropium-albuterol  (DUONEB) 0.5-2.5 (3) MG/3ML nebulizer solution 3 mL   cefTRIAXone  (ROCEPHIN ) 1 g in sodium chloride  0.9 % 100 mL IVPB    Antibiotic Indication::   CAP   azithromycin  (ZITHROMAX ) 500 mg in sodium chloride  0.9 % 250 mL IVPB   sodium chloride  0.9 % bolus 1,000 mL   iohexol (OMNIPAQUE) 350 MG/ML injection 100 mL    -I have reviewed the patients home medicines and have made adjustments as  needed     Cardiac Monitoring: The patient was maintained on a cardiac monitor.  I personally viewed and interpreted the cardiac monitored which showed an underlying rhythm of: Normal sinus rhythm  Reevaluation: After the interventions noted above,  I reevaluated the patient and found that they have :improved  Co morbidities that complicate the patient evaluation  Past Medical History:  Diagnosis Date   Allergy    Arthritis    Asthma    as a child   Cancer (HCC)    skin - neck  pre- cancer   COPD (chronic obstructive pulmonary disease) (HCC)    Depression    pt denies   Dyspnea    With exertion   GERD (gastroesophageal reflux disease)    tums if needed   Hyperlipidemia    Hypertension    Hypothyroidism    Inguinal hernia    Plantar fasciitis    Skin moles    Thyroid  disease    Umbilical hernia    repaired        Final Clinical Impression(s) / ED Diagnoses Final diagnoses:  Dyspnea, unspecified type  Bronchiectasis with acute exacerbation (HCC)     @PCDICTATION @    Afton Horse T, DO 07/10/23 2250

## 2023-07-11 ENCOUNTER — Encounter (HOSPITAL_COMMUNITY): Payer: Self-pay | Admitting: Internal Medicine

## 2023-07-11 DIAGNOSIS — J441 Chronic obstructive pulmonary disease with (acute) exacerbation: Secondary | ICD-10-CM | POA: Diagnosis not present

## 2023-07-11 DIAGNOSIS — E785 Hyperlipidemia, unspecified: Secondary | ICD-10-CM | POA: Insufficient documentation

## 2023-07-11 LAB — CBC WITH DIFFERENTIAL/PLATELET
Abs Immature Granulocytes: 0.03 10*3/uL (ref 0.00–0.07)
Basophils Absolute: 0.1 10*3/uL (ref 0.0–0.1)
Basophils Relative: 1 %
Eosinophils Absolute: 0.2 10*3/uL (ref 0.0–0.5)
Eosinophils Relative: 2 %
HCT: 37.1 % — ABNORMAL LOW (ref 39.0–52.0)
Hemoglobin: 12.4 g/dL — ABNORMAL LOW (ref 13.0–17.0)
Immature Granulocytes: 0 %
Lymphocytes Relative: 15 %
Lymphs Abs: 1.5 10*3/uL (ref 0.7–4.0)
MCH: 29.8 pg (ref 26.0–34.0)
MCHC: 33.4 g/dL (ref 30.0–36.0)
MCV: 89.2 fL (ref 80.0–100.0)
Monocytes Absolute: 1.2 10*3/uL — ABNORMAL HIGH (ref 0.1–1.0)
Monocytes Relative: 12 %
Neutro Abs: 7 10*3/uL (ref 1.7–7.7)
Neutrophils Relative %: 70 %
Platelets: 342 10*3/uL (ref 150–400)
RBC: 4.16 MIL/uL — ABNORMAL LOW (ref 4.22–5.81)
RDW: 12.8 % (ref 11.5–15.5)
WBC: 10 10*3/uL (ref 4.0–10.5)
nRBC: 0 % (ref 0.0–0.2)

## 2023-07-11 LAB — BASIC METABOLIC PANEL WITH GFR
Anion gap: 7 (ref 5–15)
BUN: 24 mg/dL — ABNORMAL HIGH (ref 8–23)
CO2: 24 mmol/L (ref 22–32)
Calcium: 9.2 mg/dL (ref 8.9–10.3)
Chloride: 105 mmol/L (ref 98–111)
Creatinine, Ser: 1.34 mg/dL — ABNORMAL HIGH (ref 0.61–1.24)
GFR, Estimated: 53 mL/min — ABNORMAL LOW (ref >60)
Glucose, Bld: 121 mg/dL — ABNORMAL HIGH (ref 70–99)
Potassium: 3.7 mmol/L (ref 3.5–5.1)
Sodium: 136 mmol/L (ref 135–145)

## 2023-07-11 LAB — EXPECTORATED SPUTUM ASSESSMENT W GRAM STAIN, RFLX TO RESP C

## 2023-07-11 MED ORDER — HYDROCHLOROTHIAZIDE 12.5 MG PO TABS
12.5000 mg | ORAL_TABLET | Freq: Every day | ORAL | Status: DC
  Administered 2023-07-11 – 2023-07-13 (×3): 12.5 mg via ORAL
  Filled 2023-07-11 (×3): qty 1

## 2023-07-11 MED ORDER — BUDESONIDE 0.25 MG/2ML IN SUSP
0.2500 mg | Freq: Two times a day (BID) | RESPIRATORY_TRACT | Status: DC
  Administered 2023-07-11 – 2023-07-13 (×6): 0.25 mg via RESPIRATORY_TRACT
  Filled 2023-07-11 (×6): qty 2

## 2023-07-11 MED ORDER — IPRATROPIUM-ALBUTEROL 0.5-2.5 (3) MG/3ML IN SOLN
3.0000 mL | Freq: Four times a day (QID) | RESPIRATORY_TRACT | Status: DC
  Administered 2023-07-11 – 2023-07-13 (×9): 3 mL via RESPIRATORY_TRACT
  Filled 2023-07-11 (×8): qty 3

## 2023-07-11 MED ORDER — SODIUM CHLORIDE 3 % IN NEBU
4.0000 mL | INHALATION_SOLUTION | Freq: Two times a day (BID) | RESPIRATORY_TRACT | Status: DC
  Administered 2023-07-11 – 2023-07-13 (×4): 4 mL via RESPIRATORY_TRACT
  Filled 2023-07-11 (×7): qty 4

## 2023-07-11 MED ORDER — SODIUM CHLORIDE 0.9 % IV SOLN
1.0000 g | INTRAVENOUS | Status: DC
  Administered 2023-07-11 – 2023-07-12 (×2): 1 g via INTRAVENOUS
  Filled 2023-07-11 (×2): qty 10

## 2023-07-11 MED ORDER — IPRATROPIUM-ALBUTEROL 0.5-2.5 (3) MG/3ML IN SOLN: 3.0000 mL | RESPIRATORY_TRACT | Status: DC | PRN

## 2023-07-11 MED ORDER — ADULT MULTIVITAMIN W/MINERALS CH
1.0000 | ORAL_TABLET | Freq: Every day | ORAL | Status: DC
  Administered 2023-07-11 – 2023-07-13 (×3): 1 via ORAL
  Filled 2023-07-11 (×3): qty 1

## 2023-07-11 MED ORDER — METHYLPREDNISOLONE SODIUM SUCC 125 MG IJ SOLR
80.0000 mg | INTRAMUSCULAR | Status: DC
  Administered 2023-07-11 – 2023-07-12 (×2): 80 mg via INTRAVENOUS
  Filled 2023-07-11 (×2): qty 2

## 2023-07-11 MED ORDER — IPRATROPIUM-ALBUTEROL 0.5-2.5 (3) MG/3ML IN SOLN
3.0000 mL | RESPIRATORY_TRACT | Status: DC
  Administered 2023-07-11: 3 mL via RESPIRATORY_TRACT
  Filled 2023-07-11: qty 3

## 2023-07-11 MED ORDER — CALCIUM CARBONATE ANTACID 500 MG PO CHEW: 500.0000 mg | CHEWABLE_TABLET | Freq: Two times a day (BID) | ORAL | Status: DC | PRN

## 2023-07-11 MED ORDER — LEVOTHYROXINE SODIUM 100 MCG PO TABS
100.0000 ug | ORAL_TABLET | Freq: Every day | ORAL | Status: DC
  Administered 2023-07-11 – 2023-07-13 (×3): 100 ug via ORAL
  Filled 2023-07-11 (×3): qty 1

## 2023-07-11 MED ORDER — ORAL CARE MOUTH RINSE: 15.0000 mL | OROMUCOSAL | Status: DC | PRN

## 2023-07-11 MED ORDER — SODIUM CHLORIDE 0.9 % IV SOLN
500.0000 mg | INTRAVENOUS | Status: DC
  Administered 2023-07-11: 500 mg via INTRAVENOUS
  Filled 2023-07-11: qty 5

## 2023-07-11 MED ORDER — SIMVASTATIN 20 MG PO TABS
40.0000 mg | ORAL_TABLET | Freq: Every evening | ORAL | Status: DC
  Administered 2023-07-11 – 2023-07-12 (×2): 40 mg via ORAL
  Filled 2023-07-11 (×2): qty 2

## 2023-07-11 MED ORDER — ENSURE PLUS HIGH PROTEIN PO LIQD
237.0000 mL | Freq: Three times a day (TID) | ORAL | Status: DC
  Administered 2023-07-11 – 2023-07-12 (×3): 237 mL via ORAL

## 2023-07-11 MED ORDER — AMLODIPINE BESYLATE 5 MG PO TABS
5.0000 mg | ORAL_TABLET | Freq: Every day | ORAL | Status: DC
  Administered 2023-07-11 – 2023-07-13 (×3): 5 mg via ORAL
  Filled 2023-07-11 (×3): qty 1

## 2023-07-11 MED ORDER — ENOXAPARIN SODIUM 40 MG/0.4ML IJ SOSY
40.0000 mg | PREFILLED_SYRINGE | INTRAMUSCULAR | Status: DC
  Administered 2023-07-11 – 2023-07-12 (×2): 40 mg via SUBCUTANEOUS
  Filled 2023-07-11 (×3): qty 0.4

## 2023-07-11 NOTE — Progress Notes (Signed)
 SATURATION QUALIFICATIONS: (This note is used to comply with regulatory documentation for home oxygen)  Patient Saturations on Room Air at Rest = 96%  Patient Saturations on Room Air while Ambulating = 92%  Patient Saturations on 0 Liters of oxygen while Ambulating = 92%  Please briefly explain why patient needs home oxygen: Patient does not  qualify. Sat remained 92% while ambulating.

## 2023-07-11 NOTE — Evaluation (Signed)
 Occupational Therapy Evaluation Patient Details Name: Brandon Sanford MRN: [id number] DOB: April 29, 1940 Today's Date: 07/11/2023   History of Present Illness   Pt is a 83 y/o male presenting on 6/19 with worsening shortness of breath. Admitted with COPD exacerbation/bronchiectasis. PMH includes: allergy, arthritis, asthma, skin CA, COPD, HTN, L THA.     Clinical Impressions Patient admitted for above and presents at baseline independent level for ADLs, transfers and functional mobility. Cognition, strength, balance are WFL. Pt with some decreased activity tolerance, but tolerating ADL participation well.  He does become short of breath with LB Adls, reviewed compensatory techniques and energy conservation techniques (provided handout).  He was on 2L upon entry but able to maintain SPO2 on RA during session >93%.  Donned 2L upon exit due to shortness of breath. No further OT needs have been identified, pt has no further questions or concerns, and OT will sign off. Thank you for this referral!       If plan is discharge home, recommend the following:   Assistance with cooking/housework     Functional Status Assessment   Patient has not had a recent decline in their functional status     Equipment Recommendations   None recommended by OT     Recommendations for Other Services         Precautions/Restrictions   Precautions Precautions: Other (comment) Recall of Precautions/Restrictions: Intact Precaution/Restrictions Comments: watch O2 Restrictions Weight Bearing Restrictions Per Provider Order: No     Mobility Bed Mobility Overal bed mobility: Independent                  Transfers Overall transfer level: Independent                        Balance Overall balance assessment: No apparent balance deficits (not formally assessed)                                         ADL either performed or assessed with clinical judgement    ADL Overall ADL's : Independent                                             Vision   Vision Assessment?: No apparent visual deficits     Perception         Praxis         Pertinent Vitals/Pain Pain Assessment Pain Assessment: No/denies pain     Extremity/Trunk Assessment Upper Extremity Assessment Upper Extremity Assessment: Overall WFL for tasks assessed   Lower Extremity Assessment Lower Extremity Assessment: Defer to PT evaluation   Cervical / Trunk Assessment Cervical / Trunk Assessment: Normal   Communication Communication Communication: Impaired Factors Affecting Communication: Hearing impaired (wears hearing aides)   Cognition Arousal: Alert Behavior During Therapy: WFL for tasks assessed/performed Cognition: No apparent impairments                               Following commands: Intact       Cueing  General Comments   Cueing Techniques: Verbal cues  spouse at side and supportive; pt on 2L upon entry.  RA for mobility in room 95%, dropped to 93% with LB ADLs  with noted SOB.  replaced 2L at end of session. Educated on energy  conservation techniques and provided handout.   Exercises     Shoulder Instructions      Home Living Family/patient expects to be discharged to:: Private residence Living Arrangements: Spouse/significant other Available Help at Discharge: Family;Available 24 hours/day Type of Home: House Home Access: Level entry     Home Layout: One level     Bathroom Shower/Tub: Chief Strategy Officer: Standard     Home Equipment: Cane - single point;Rollator (4 wheels);Grab bars - tub/shower;Shower seat;Adaptive equipment Adaptive Equipment: Reacher;Long-handled sponge        Prior Functioning/Environment Prior Level of Function : Independent/Modified Independent;Working/employed;Driving                    OT Problem List: Decreased activity tolerance;Decreased  knowledge of use of DME or AE;Decreased knowledge of precautions   OT Treatment/Interventions:        OT Goals(Current goals can be found in the care plan section)   Acute Rehab OT Goals Patient Stated Goal: get better OT Goal Formulation: With patient   OT Frequency:       Co-evaluation              AM-PAC OT 6 Clicks Daily Activity     Outcome Measure Help from another person eating meals?: None Help from another person taking care of personal grooming?: None Help from another person toileting, which includes using toliet, bedpan, or urinal?: None Help from another person bathing (including washing, rinsing, drying)?: None Help from another person to put on and taking off regular upper body clothing?: None Help from another person to put on and taking off regular lower body clothing?: None 6 Click Score: 24   End of Session Equipment Utilized During Treatment: Oxygen Nurse Communication: Mobility status  Activity Tolerance: Patient tolerated treatment well Patient left: in bed;with call bell/phone within reach;with family/visitor present  OT Visit Diagnosis: Other abnormalities of gait and mobility (R26.89);Other (comment) (decreased activity tolerance)                Time: 2841-3244 OT Time Calculation (min): 25 min Charges:  OT General Charges $OT Visit: 1 Visit OT Evaluation $OT Eval Low Complexity: 1 Low OT Treatments $Self Care/Home Management : 8-22 mins  Bary Boss, OT Acute Rehabilitation Services Office [phone] Secure Chat Preferred    Fredrich Jefferson 07/11/2023, 1:25 PM

## 2023-07-11 NOTE — Progress Notes (Addendum)
 NAME:  Brandon Sanford, MRN:  [id number], DOB:  1940-06-11, LOS: 1 ADMISSION DATE:  07/10/2023, CONSULTATION DATE: 07/11/2023 REFERRING MD: [facility], CHIEF COMPLAINT: Shortness of breath  History of Present Illness:  83 year old male who quit smoking 10 years ago and is followed by Dr. 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  He was in his usual state of health until approximately 3 weeks ago.  Fevers chills sweats purulent green sputum he went to his primary care physician and was treated with Zithromax  with some improvement.  He did not improve adequately and went to [facility] at which time he was given IV antibiotics IV steroids CT of the chest and transferred to [facility] for further evaluation and treatment.  He does have an incentive spirometer but does not have a flutter valve.  Being treated adequately with bronchodilators and IV antibiotics and reports that he feels much better.  He would like to establish with another pulmonary physician besides Dr. Waymond Hailey.  Pertinent  Medical History   Past Medical History:  Diagnosis Date   Allergy    Arthritis    Asthma    as a child   Cancer (HCC)    skin - neck  pre- cancer   COPD (chronic obstructive pulmonary disease) (HCC)    Depression    pt denies   Dyspnea    With exertion   GERD (gastroesophageal reflux disease)    tums if needed   Hyperlipidemia    Hypertension    Hypothyroidism    Inguinal hernia    Plantar fasciitis    Skin moles    Thyroid  disease    Umbilical hernia    repaired     Significant Hospital Events: Including procedures, antibiotic start and stop dates in addition to other pertinent events     Interim History / Subjective:  Feels much better with current interventions  Objective    Blood pressure 133/76, pulse 68, temperature 98.3 F (36.8 C), temperature source Oral, resp. rate 19, height 5' 9 (1.753 m), weight 78.5 kg, SpO2 96%.    FiO2 (%):  [28 %] 28 %    Intake/Output Summary (Last 24 hours) at 07/11/2023 7253 Last data filed at 07/10/2023 1738 Gross per 24 hour  Intake 1250 ml  Output --  Net 1250 ml   Filed Weights   07/10/23 1443  Weight: 78.5 kg    Examination: General: Very upbeat male no acute distress currently on oxygen HENT: No JVD or lymphadenopathy is appreciated Lungs: Coarse rhonchi with expiratory wheezing noted Cardiovascular: Heart sounds are regular Abdomen: Abdomen soft nontender Extremities: Extremities without edema Neuro: Mostly intact without focal defect GU: Voids  Resolved problem list   Assessment and Plan  83 year old former smoker pulmonary patient of Dr. Waymond Hailey he has been diagnosed with bronchiectasis Gold COPD who is not O2 dependent.  He developed a cough fever purulent sputum was seen by private physician then by urgent care and is inadequately treated with antimicrobial therapy.  He represented to the [facility] and was subsequently transferred to [facility].  CT scan showing bronchiectasis and copd  He reports feeling much better since he has been treated with IV steroids IV antibiotics nebulizers and oxygen. Wean FiO2 as able Add flutter valve for pulmonary toilet, note he has had COVID x 2 Continue steroid Continue antibiotics  Hypertension Hyperlipidemia Hypothyroidism per primary  Best Practice (right click and Reselect all SmartList  Selections daily)   Diet/type: Regular consistency (see orders) DVT prophylaxis LMWH Pressure ulcer(s): N/A GI prophylaxis: PPI Lines: N/A Foley:  N/A Code Status:  full code Last date of multidisciplinary goals of care discussion [tbd]  Labs   CBC: Recent Labs  Lab 07/10/23 1456 07/11/23 0100  WBC 12.9* 10.0  NEUTROABS 10.2* 7.0  HGB 14.3 12.4*  HCT 43.2 37.1*  MCV 88.5 89.2  PLT 389 342    Basic Metabolic Panel: Recent Labs  Lab 07/10/23 1456 07/11/23 0100  NA 138 136  K 3.9 3.7  CL 101 105  CO2 24  24  GLUCOSE 109* 121*  BUN 24* 24*  CREATININE 1.39* 1.34*  CALCIUM  10.2 9.2   GFR: Estimated Creatinine Clearance: 42.5 mL/min (A) (by C-G formula based on SCr of 1.34 mg/dL (H)). Recent Labs  Lab 07/10/23 1456 07/11/23 0100  WBC 12.9* 10.0    Liver Function Tests: Recent Labs  Lab 07/10/23 1456  AST 15  ALT 18  ALKPHOS 103  BILITOT 0.5  PROT 7.1  ALBUMIN 3.9   No results for input(s): LIPASE, AMYLASE in the last 168 hours. No results for input(s): AMMONIA in the last 168 hours.  ABG    Component Value Date/Time   HCO3 20.9 09/18/2006 1005   TCO2 24 05/07/2017 1049   ACIDBASEDEF 4.0 (H) 09/18/2006 1005     Coagulation Profile: No results for input(s): INR, PROTIME in the last 168 hours.  Cardiac Enzymes: No results for input(s): CKTOTAL, CKMB, CKMBINDEX, TROPONINI in the last 168 hours.  HbA1C: No results found for: HGBA1C  CBG: No results for input(s): GLUCAP in the last 168 hours.  Review of Systems:   10 point review of system taken, please see HPI for positives and negatives.     Past Medical History:  He,  has a past medical history of Allergy, Arthritis, Asthma, Cancer (HCC), COPD (chronic obstructive pulmonary disease) (HCC), Depression, Dyspnea, GERD (gastroesophageal reflux disease), Hyperlipidemia, Hypertension, Hypothyroidism, Inguinal hernia, Plantar fasciitis, Skin moles, Thyroid  disease, and Umbilical hernia.   Surgical History:   Past Surgical History:  Procedure Laterality Date   COLONOSCOPY     COLONOSCOPY W/ POLYPECTOMY     HERNIA REPAIR  11/26/10   umbilical hernia and LIH repair    INGUINAL HERNIA REPAIR  11/26/2010   Procedure: HERNIA REPAIR INGUINAL ADULT;  Surgeon: Cloyce Darby, MD;  Location: [facility];  Service: General;  Laterality: Left;  left inguinal hernia repair with mesh   INGUINAL HERNIA REPAIR Right 02/04/2018   Procedure: REPAIR OF RIGHT INGUINAL HERNIA WITH MESH;  Surgeon:  Dorena Gander, MD;  Location: [facility] OR;  Service: General;  Laterality: Right;   INGUINAL HERNIA REPAIR Right 10/16/2018   Procedure: LAPAROSCOPIC RIGHT INGUINAL HERNIA;  Surgeon: Shela Derby, MD;  Location: [facility] OR;  Service: General;  Laterality: Right;   INSERTION OF MESH Right 02/04/2018   Procedure: INSERTION OF MESH;  Surgeon: Dorena Gander, MD;  Location: [facility] OR;  Service: General;  Laterality: Right;   INSERTION OF MESH N/A 10/16/2018   Procedure: Insertion Of Mesh;  Surgeon: Shela Derby, MD;  Location: [facility] OR;  Service: General;  Laterality: N/A;   TONSILLECTOMY     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 His smoking use included cigarettes. He started smoking about 67 years ago. He has never used smokeless tobacco. He reports current alcohol use of about 2.0 standard drinks of alcohol per week. He reports that he does not use drugs.   Family History:  His family history is negative for Colon cancer, Colon polyps, Esophageal cancer, Stomach cancer, and Rectal cancer.   Allergies Allergies  Allergen Reactions   Lisinopril Cough   Chlorhexidine  Rash    Pre-surgery soap   Pneumococcal Vaccines Rash     Home Medications  Prior to Admission medications   Medication Sig Start Date End Date Taking? Authorizing Provider  albuterol  (PROVENTIL ) (2.5 MG/3ML) 0.083% nebulizer solution Use one half to one vial every 4 hours as needed Patient taking differently: Take 2.5 mg by nebulization every 6 (six) hours as needed for shortness of  breath. 06/30/23  Yes [name], MD  albuterol  (VENTOLIN  HFA) 108 (90 Base) MCG/ACT inhaler 1- 2puffs every 4 hours if needed Patient taking differently: Inhale 1 puff into the lungs every 6 (six) hours as needed for shortness of breath. 1- 2puffs every 4 hours if needed 06/30/23  Yes [name], MD  amLODipine  (NORVASC ) 5 MG tablet Take 5 mg by mouth daily.   Yes [provider]  Budeson-Glycopyrrol-Formoterol  (BREZTRI  AEROSPHERE) 160-9-4.8 MCG/ACT AERO Inhale 2 puffs into the lungs in the morning and at bedtime. 12/20/21  Yes [name], MD  hydrochlorothiazide (MICROZIDE) 12.5 MG capsule Take 12.5 mg by mouth daily.   Yes [provider]  levothyroxine  (SYNTHROID , LEVOTHROID) 100 MCG tablet Take 100 mcg by mouth daily before breakfast.   Yes [provider]  menthol -cetylpyridinium (CEPACOL) 3 MG lozenge Take 1 lozenge by mouth daily as needed for sore throat.   Yes [provider]  Multiple Vitamin (MULTIVITAMIN) tablet Take 1 tablet by mouth daily.   Yes [provider]  Polyethyl Glycol-Propyl Glycol 0.4-0.3 % SOLN Place 1-2 drops into both eyes 3 (three) times daily as needed (dry eyes/irritated eyes).   Yes [provider]  simvastatin  (ZOCOR ) 40 MG tablet Take 40 mg by mouth every evening.    Yes [provider]  TUMS 500 MG chewable tablet Chew 500 mg by mouth 2 (two) times daily as needed for indigestion or heartburn. 04/19/17  Yes [provider]  amoxicillin -clavulanate (AUGMENTIN ) 875-125 MG tablet Take 1 tablet by mouth every 12 (twelve) hours. Patient not taking: Reported on 07/10/2023 06/26/23   [name], NP     Critical care time: Bonnee Bustle Ciaira Natividad ACNP Acute Care Nurse Practitioner Jonny Neu Pulmonary/Critical Care Please consult Amion 07/11/2023, 9:24 AM

## 2023-07-11 NOTE — Progress Notes (Signed)
 Pt is transferred from ED [facility] by [facility] to [facility] floor 3W room 27. Pt is alert and fully oriented x 4, afebrile, stable hemodynamically, on 2 LPM of O2 NCL.   At arrival, he is able to walk to the rest room independently with his cane. He presents SOB with exertion in short distance walking on flat floor from bed to the rest room. He has mild expiratory wheezing bilaterally and right lower coarse crackles on auscultations.   MD is at bedside, aware and working up on medical order.   Plan of care is reviewed. Pt is oriented to room and equipments, call bell is within reachable. We will continue to monitor.     07/10/23 2108  Vitals  Temp 98.4 F (36.9 C)  Temp Source Oral  BP 128/67  MAP (mmHg) 85  BP Location Right Arm  BP Method Automatic  Patient Position (if appropriate) Sitting  Pulse Rate 92  Pulse Rate Source Monitor  ECG Heart Rate 92  Resp (!) 24  Level of Consciousness  Level of Consciousness Alert  MEWS COLOR  MEWS Score Color Green  Oxygen Therapy  SpO2 98 %  O2 Device Nasal Cannula  O2 Flow Rate (L/min) 2 L/min  Pulse Oximetry Type Intermittent  Pain Assessment  Pain Scale 0-10  Pain Score 0  Complaints & Interventions  Neuro symptoms relieved by Rest;Relaxation techniques (Comment)  PCA/Epidural/Spinal Assessment  Respiratory Pattern Regular;Dyspnea with exertion;Symmetrical;Shallow;Tachypnea  Glasgow Coma Scale  Eye Opening 4  Best Verbal Response (NON-intubated) 5  Best Motor Response 6  Glasgow Coma Scale Score 15   Brain Cahill, RN

## 2023-07-11 NOTE — Progress Notes (Addendum)
 PROGRESS NOTE    Brandon Sanford  ZHY:[id number] DOB: 09-16-1940 DOA: 07/10/2023 PCP: Benedetto Brady, MD    Brief Narrative:   Brandon Sanford is a 83 y.o. male with history of COPD/bronchiectasis, hypertension, hyperlipidemia, hypothyroidism presents to the ER with worsening shortness of breath.  Patient had come to the urgent care on 06/26/2023 with symptoms of bronchitis was prescribed Augmentin  and prednisone  and followed up with patient's pulmonologist Dr. Waymond Hailey on 06/30/2023 when patient's inhalers were changed and Prilosec was added.  Patient's shortness of breath was worsening patient presents to the ER.  Assessment and Plan: COPD exacerbation/bronchiectasis  -presently on 2 L oxygen --- does not wear at home-- wean and do home O2 eval -flutter valve - IV Solu-Medrol   -nebs -abx -Sputum cultures.  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  Hyperlipidemia  -on statins.  Hypothyroidism  -on Synthroid .  Chronic kidney disease stage III -Cr stable   DVT prophylaxis: enoxaparin  (LOVENOX ) injection 40 mg Start: 07/11/23 1000    Code Status: Full Code   Disposition Plan:  Level of care: Telemetry Medical Status is: Inpatient     Consultants:  PCCM   Subjective: Breathing better than yesterday but still feels tight and wheezy  Objective: Vitals:   07/11/23 0053 07/11/23 0058 07/11/23 0534 07/11/23 0801  BP:   120/64 133/76  Pulse: 71  60 68  Resp: 20  20 19   Temp:   98.3 F (36.8 C)   TempSrc:   Oral   SpO2: 97% 97% 98% 96%  Weight:      Height:        Intake/Output Summary (Last 24 hours) at 07/11/2023 0803 Last data filed at 07/10/2023 1738 Gross per 24 hour  Intake 1250 ml  Output --  Net 1250 ml   Filed Weights   07/10/23 1443  Weight: 78.5 kg    Examination:   General: Appearance:     Overweight male in no acute distress     Lungs:     respirations unlabored, on [place]--  diminished but no wheezing  Heart:    Normal heart rate.   MS:   All extremities are intact.    Neurologic:   Awake, alert       Data Reviewed: I have personally reviewed following labs and imaging studies  CBC: Recent Labs  Lab 07/10/23 1456 07/11/23 0100  WBC 12.9* 10.0  NEUTROABS 10.2* 7.0  HGB 14.3 12.4*  HCT 43.2 37.1*  MCV 88.5 89.2  PLT 389 342   Basic Metabolic Panel: Recent Labs  Lab 07/10/23 1456 07/11/23 0100  NA 138 136  K 3.9 3.7  CL 101 105  CO2 24 24  GLUCOSE 109* 121*  BUN 24* 24*  CREATININE 1.39* 1.34*  CALCIUM  10.2 9.2   GFR: Estimated Creatinine Clearance: 42.5 mL/min (A) (by C-G formula based on SCr of 1.34 mg/dL (H)). Liver Function Tests: Recent Labs  Lab 07/10/23 1456  AST 15  ALT 18  ALKPHOS 103  BILITOT 0.5  PROT 7.1  ALBUMIN 3.9   No results for input(s): LIPASE, AMYLASE in the last 168 hours. No results for input(s): AMMONIA in the last 168 hours. Coagulation Profile: No results for input(s): INR, PROTIME in the last 168 hours. Cardiac Enzymes: No results for input(s): CKTOTAL, CKMB, CKMBINDEX, TROPONINI in the last 168 hours. BNP (last 3 results) No results for input(s): PROBNP in the last 8760 hours. HbA1C: No  results for input(s): HGBA1C in the last 72 hours. CBG: No results for input(s): GLUCAP in the last 168 hours. Lipid Profile: No results for input(s): CHOL, HDL, LDLCALC, TRIG, CHOLHDL, LDLDIRECT in the last 72 hours. Thyroid  Function Tests: No results for input(s): TSH, T4TOTAL, FREET4, T3FREE, THYROIDAB in the last 72 hours. Anemia Panel: No results for input(s): VITAMINB12, FOLATE, FERRITIN, TIBC, IRON, RETICCTPCT in the last 72 hours. Sepsis Labs: No results for input(s): PROCALCITON, LATICACIDVEN in the last 168 hours.  No results found for this or any previous visit (from the past 240 hours).       Radiology Studies: CT Angio Chest PE W  and/or Wo Contrast Result Date: 07/10/2023 CLINICAL DATA:  Worsening shortness of breath EXAM: CT ANGIOGRAPHY CHEST WITH CONTRAST TECHNIQUE: Multidetector CT imaging of the chest was performed using the standard protocol during bolus administration of intravenous contrast. Multiplanar CT image reconstructions and MIPs were obtained to evaluate the vascular anatomy. RADIATION DOSE REDUCTION: This exam was performed according to the departmental dose-optimization program which includes automated exposure control, adjustment of the mA and/or kV according to patient size and/or use of iterative reconstruction technique. CONTRAST:  75mL OMNIPAQUE IOHEXOL 350 MG/ML SOLN COMPARISON:  08/21/2021 and chest radiograph 07/09/2021 FINDINGS: Cardiovascular: No filling defect is identified in the pulmonary arterial tree to suggest pulmonary embolus. Coronary, aortic arch, and branch vessel atherosclerotic vascular disease. Mediastinum/Nodes: Prevascular node 1.1 cm in short axis on image 28 series 302, formerly the same. Right hilar node 1.1 cm in short axis on image 66 series 302, formerly 1.0 cm. Left hilar node 1.2 cm in short axis on image 68 series 302, difficult to measure on prior exam due to lack of IV contrast but suspected 1.2 cm (stable). Small type 1 hiatal hernia. Lungs/Pleura: Emphysema. Airway thickening and mild airway plugging in both lower lobes with mild associated atelectasis. There is also some mucous plugging and atelectasis in the lingula. Peribronchovascular nodularity in the left lower lobe is probably from infrahilar lymph nodes. Near occlusion of the right upper lobe bronchus due to nodularity and bronchial wall thickening on images 59-63 of series 303. Upper Abdomen: Bilateral fluid density renal cysts along the upper poles. Atheromatous plaque at the origins of the celiac trunk and SMA. There is some cephalad extension of small bowel loops anterior to the left hepatic lobe. Musculoskeletal: Thoracic  spondylosis. Review of the MIP images confirms the above findings. IMPRESSION: 1. No filling defect is identified in the pulmonary arterial tree to suggest pulmonary embolus. 2. Airway thickening and mild airway plugging in both lower lobes with mild associated atelectasis. There is also some mucous plugging and atelectasis in the lingula. 3. Near occlusion of the right upper lobe bronchus due to nodularity and bronchial wall thickening. Bronchoscopy or short-term follow up CT scan is recommended to exclude underlying neoplasm. 4. Peribronchovascular nodularity in the left lower lobe is probably from infrahilar lymph nodes. 5. Mild mediastinal and hilar adenopathy, roughly similar to 08/21/2021, possibly reactive but technically nonspecific. 6. Small type 1 hiatal hernia. 7. Bilateral fluid density renal cysts along the upper poles. 8. Thoracic spondylosis. 9. Aortic Atherosclerosis (ICD10-I70.0) and Emphysema (ICD10-J43.9). Electronically Signed   By: Freida Jes M.D.   On: 07/10/2023 17:01   DG Chest 1 View Result Date: 07/10/2023 CLINICAL DATA:  Chest tightness. EXAM: CHEST  1 VIEW COMPARISON:  June 26, 2023 FINDINGS: The heart size and mediastinal contours are within normal limits. Mild linear scarring and/or atelectasis is seen within the  bilateral lung bases. There is no evidence of focal consolidation, pleural effusion or pneumothorax. Multilevel degenerative changes seen throughout the thoracic spine. IMPRESSION: Mild bibasilar linear scarring and/or atelectasis. Electronically Signed   By: Virgle Grime M.D.   On: 07/10/2023 16:09        Scheduled Meds:  amLODipine   5 mg Oral Daily   budesonide (PULMICORT) nebulizer solution  0.25 mg Nebulization BID   enoxaparin  (LOVENOX ) injection  40 mg Subcutaneous Q24H   hydrochlorothiazide  12.5 mg Oral Daily   ipratropium-albuterol   3 mL Nebulization Q6H   levothyroxine   100 mcg Oral Q0600   methylPREDNISolone  (SOLU-MEDROL ) injection  80 mg  Intravenous Q24H   simvastatin   40 mg Oral QPM   Continuous Infusions:  cefTRIAXone  (ROCEPHIN )  IV       LOS: 1 day    Time spent: 45 minutes spent on chart review, discussion with nursing staff, consultants, updating family and interview/physical exam; more than 50% of that time was spent in counseling and/or coordination of care.    Enrigue Harvard, DO [facility] Available via Epic secure chat 7am-7pm After these hours, please refer to coverage provider listed on [email or web address] 07/11/2023, 8:03 AM

## 2023-07-11 NOTE — Consult Note (Incomplete)
   NAME:  Brandon Sanford, MRN:  [id number], DOB:  04-May-1940, LOS: 1 ADMISSION DATE:  07/10/2023, CONSULTATION DATE:   REFERRING MD:  ***, CHIEF COMPLAINT:  ***   History of Present Illness:  ***  Pertinent  Medical History  ***  Significant Hospital Events: Including procedures, antibiotic start and stop dates in addition to other pertinent events     Interim History / Subjective:  ***  Objective    Blood pressure 120/64, pulse 60, temperature 98.3 F (36.8 C), temperature source Oral, resp. rate 20, height 5' 9 (1.753 m), weight 78.5 kg, SpO2 98%.    FiO2 (%):  [28 %] 28 %   Intake/Output Summary (Last 24 hours) at 07/11/2023 7829 Last data filed at 07/10/2023 1738 Gross per 24 hour  Intake 1250 ml  Output --  Net 1250 ml   Filed Weights   07/10/23 1443  Weight: 78.5 kg    Examination: General: *** HENT: *** Lungs: *** Cardiovascular: *** Abdomen: *** Extremities: *** Neuro: *** GU: ***  Resolved problem list   Assessment and Plan    Best Practice (right click and Reselect all SmartList Selections daily)   Diet/type: {diet type:25684} DVT prophylaxis {anticoagulation:25687} Pressure ulcer(s): {pressure ulcer(s):31683} GI prophylaxis: {FA:21308} Lines: {Central Venous Access:25771} Foley:  {Central Venous Access:25691} Code Status:  {Code Status:26939} Last date of multidisciplinary goals of care discussion [***]  Signature:   Phyllis Breeze MD [facility] See Amion for pager  If no response to pager , please call [phone] until 7pm After 7:00 pm call Elink  [phone] 07/11/2023, 7:52 AM

## 2023-07-11 NOTE — Telephone Encounter (Signed)
 Called patient and he is still in the hospital and he said that he would give us  a call back once he is out to be scheduled.

## 2023-07-11 NOTE — TOC Initial Note (Signed)
 Transition of Care [facility]) - Initial/Assessment Note    Patient Details  Name: Brandon Sanford MRN: [id number] Date of Birth: [birth year]  Transition of [facility]) CM/SW Contact:    Jonathan Neighbor, RN Phone Number: 07/11/2023, 10:55 AM  Clinical Narrative:                  PCP: Dr Ivey Marlin Pt is from home with his spouse. They are together most of the time.  Pt drives but spouse can provide transportation if needed.  Pt manages his own medications and denies any issues. Awaiting therapy evals. TOC following.  Expected Discharge Plan:  (TBD) Barriers to Discharge: Continued Medical Work up   Patient Goals and CMS Choice            Expected Discharge Plan and Services   Discharge Planning Services: CM Consult   Living arrangements for the past 2 months: Single Family Home                                      Prior Living Arrangements/Services Living arrangements for the past 2 months: Single Family Home Lives with:: Spouse Patient language and need for interpreter reviewed:: Yes Do you feel safe going back to the place where you live?: Yes        Care giver support system in place?: Yes (comment) Current home services: DME (cane/ walker) Criminal Activity/Legal Involvement Pertinent to Current Situation/Hospitalization: No - Comment as needed  Activities of Daily Living   ADL Screening (condition at time of admission) Independently performs ADLs?: Yes (appropriate for developmental age) Is the patient deaf or have difficulty hearing?: No Does the patient have difficulty seeing, even when wearing glasses/contacts?: No Does the patient have difficulty concentrating, remembering, or making decisions?: No  Permission Sought/Granted                  Emotional Assessment Appearance:: Appears stated age Attitude/Demeanor/Rapport: Engaged Affect (typically observed): Accepting Orientation: : Oriented to Self, Oriented to Place, Oriented to  Time, Oriented  to Situation   Psych Involvement: No (comment)  Admission diagnosis:  COPD exacerbation (HCC) [J44.1] Patient Active Problem List   Diagnosis Date Noted   HLD (hyperlipidemia) 07/11/2023   COPD exacerbation (HCC) 07/10/2023   Stage 3a chronic kidney disease (CKD) (HCC) 11/26/2021   Pneumonia 11/26/2021   COVID-19 virus infection 11/26/2021   Medication reaction 12/29/2020   History of tobacco abuse 12/29/2020   GERD without esophagitis 12/29/2020   Allergic rhinitis with postnasal drip 12/29/2020   Tachycardia 09/07/2020   CAP (community acquired pneumonia) 09/07/2020   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   Pain in 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   Unilateral primary osteoarthritis, 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   Umbilical pain 02/12/2012   COPD  GOLD II 06/03/2007   Solitary pulmonary nodule 04/27/2007   DYSPNEA 04/27/2007   Hypothyroidism 04/24/2007   DEPRESSION 04/24/2007   Essential hypertension 04/24/2007   PCP:  Benedetto Brady, MD Pharmacy:   [facility] [address]  - [address] [place] [address] Phone: [phone] Fax: [phone]     Social Drivers of Health (SDOH) Social History: SDOH Screenings   Food Insecurity: No Food Insecurity (07/10/2023)  Housing: Low Risk  (07/10/2023)  Transportation Needs: No Transportation Needs (07/10/2023)  Utilities: Not At Risk (07/10/2023)  Social Connections: Moderately Isolated (07/10/2023)  Tobacco Use:  Medium Risk (07/11/2023)   SDOH Interventions:     Readmission Risk Interventions    11/27/2021    1:42 PM  Readmission Risk Prevention Plan  Post Dischage Appt Complete  Medication Screening Complete  Transportation Screening Complete

## 2023-07-11 NOTE — Evaluation (Signed)
 Physical Therapy Brief Evaluation and Discharge Note Patient Details Name: Brandon Sanford MRN: [id number] DOB: 1940/03/23 Today's Date: 07/11/2023   History of Present Illness  Pt is a 83 y/o male presenting on 6/19 with worsening shortness of breath. Admitted with COPD exacerbation/bronchiectasis. PMH includes: allergy, arthritis, asthma, skin CA, COPD, HTN, L THA.  Clinical Impression  Pt presents with admitting diagnosis above. Pt today was able to ambulate in hallway with no AD independently. PTA pt was fully independent. Pt presents at or near baseline mobility. Pt has no further acute PT needs and will be signing off. Re consult PT if mobility status changes.        PT Assessment Patient does not need any further PT services  Assistance Needed at Discharge  PRN    Equipment Recommendations None recommended by PT  Recommendations for Other Services       Precautions/Restrictions Precautions Precautions: Other (comment) Recall of Precautions/Restrictions: Intact Precaution/Restrictions Comments: watch O2 Restrictions Weight Bearing Restrictions Per Provider Order: No        Mobility  Bed Mobility   Supine/Sidelying to sit: Independent Sit to supine/sidelying: Independent    Transfers Overall transfer level: Independent                      Ambulation/Gait Ambulation/Gait assistance: Independent Gait Distance (Feet): 100 Feet Assistive device: None Gait Pattern/deviations: WFL(Within Functional Limits) Gait Speed: Pace WFL General Gait Details: No LOB noted.  Home Activity Instructions    Stairs            Modified Rankin (Stroke Patients Only)        Balance Overall balance assessment: No apparent balance deficits (not formally assessed)                        Pertinent Vitals/Pain PT - Brief Vital Signs All Vital Signs Stable: Yes Pain Assessment Pain Assessment: No/denies pain     Home Living Family/patient expects  to be discharged to:: Private residence Living Arrangements: Spouse/significant other Available Help at Discharge: Family;Available 24 hours/day Home Environment: Level entry   Home Equipment: Cane - single point;Rollator (4 wheels);Grab bars - tub/shower;Shower seat;Adaptive equipment Adaptive Equipment: Reacher;Long-handled sponge      Prior Function Level of Independence: Independent      UE/LE Assessment   UE ROM/Strength/Tone/Coordination: WFL    LE ROM/Strength/Tone/Coordination: [facility]      Communication   Communication Communication: Impaired Factors Affecting Communication: Hearing impaired (wears hearing aides)     Cognition Overall Cognitive Status: Appears within functional limits for tasks assessed/performed       General Comments General comments (skin integrity, edema, etc.): VSS. SpO2 between 92-96% on RA.    Exercises     Assessment/Plan    PT Problem List         PT Visit Diagnosis Other abnormalities of gait and mobility (R26.89)    No Skilled PT Patient at baseline level of functioning;Patient is independent with all acitivity/mobility   Co-evaluation                AMPAC 6 Clicks Help needed turning from your back to your side while in a flat bed without using bedrails?: None Help needed moving from lying on your back to sitting on the side of a flat bed without using bedrails?: None Help needed moving to and from a bed to a chair (including a wheelchair)?: None Help needed standing up from a chair using your  arms (e.g., wheelchair or bedside chair)?: None Help needed to walk in hospital room?: None Help needed climbing 3-5 steps with a railing? : None 6 Click Score: 24      End of Session Equipment Utilized During Treatment: Gait belt;Oxygen Activity Tolerance: Patient tolerated treatment well Patient left: in bed;with call bell/phone within reach;with family/visitor present Nurse Communication: Mobility status PT Visit  Diagnosis: Other abnormalities of gait and mobility (R26.89)     Time: 0981-1914 PT Time Calculation (min) (ACUTE ONLY): 10 min  Charges:   PT Evaluation $PT Eval Low Complexity: 1 Low      Adara Kittle B, PT, DPT Acute Rehab Services [phone]   Dorrie Cocuzza  07/11/2023, 4:02 PM

## 2023-07-11 NOTE — Progress Notes (Signed)
 Initial Nutrition Assessment  DOCUMENTATION CODES:  Severe malnutrition in context of chronic illness  INTERVENTION:  Liberalize to regular diet Encourage PO intake Ensure Plus High Protein po TID, each supplement provides 350 kcal and 20 grams of protein MVI with minerals daily  NUTRITION DIAGNOSIS:   Severe Malnutrition related to chronic illness (COPD) as evidenced by severe muscle depletion, severe fat depletion.  GOAL:   Patient will meet greater than or equal to 90% of their needs  MONITOR:   PO intake, Supplement acceptance, Labs, Weight trends  REASON FOR ASSESSMENT:   Consult Assessment of nutrition requirement/status  ASSESSMENT:   Pt with hx of COPD/bronchiectasis, HTN, HLD, CKD3, hypothyroidism, and GERD presented to ED with worsening SOB. Found to have a COPD exacerbation.  Pt resting in bed at the time of assessment, wife at bedside assists with history. Pt reports that his appetite has been poor for almost a month as he has been having trouble breathing. Has also had decreased energy and weight loss.  Do not think that current weight is accurate - will request new.  Pt agreeable to receiving nutrition supplements and will also liberalize diet. Pt with signs of weight loss with significant muscle and fat depletions.    Admit / Current weight: 78.5 kg    Nutritionally Relevant Medications: Scheduled Meds:  hydrochlorothiazide  12.5 mg Oral Daily   levothyroxine   100 mcg Oral Q0600   methylPREDNISolone  (SOLU-MEDROL )   80 mg Intravenous Q24H   simvastatin   40 mg Oral QPM   Continuous Infusions:  azithromycin  (ZITHROMAX ) 500 mg in sodium chloride  0.9 % 250 mL IVPB     cefTRIAXone  (ROCEPHIN )  IV     PRN Meds: calcium  carbonate  Labs Reviewed: BUN 24, creatinine 1.34 CBG ranges from 109-121 mg/dL over the last 24 hours  NUTRITION - FOCUSED PHYSICAL EXAM: Flowsheet Row Most Recent Value  Orbital Region Severe depletion  Upper Arm Region Severe  depletion  Thoracic and Lumbar Region Moderate depletion  Buccal Region Severe depletion  Temple Region Moderate depletion  Clavicle Bone Region Severe depletion  Clavicle and Acromion Bone Region Severe depletion  Scapular Bone Region Moderate depletion  Dorsal Hand Severe depletion  Patellar Region Severe depletion  Anterior Thigh Region Severe depletion  Posterior Calf Region Moderate depletion  Edema (RD Assessment) None  Hair Reviewed  Eyes Reviewed  Mouth Reviewed  Skin Reviewed  Nails Reviewed   Diet Order:   Diet Order             Diet regular Room service appropriate? Yes; Fluid consistency: Thin  Diet effective now                   EDUCATION NEEDS:  Education needs have been addressed  Skin:  Skin Assessment: Reviewed RN Assessment  Last BM:  6/19 - type 5  Height:  Ht Readings from Last 1 Encounters:  07/10/23 5' 9 (1.753 m)    Weight:  Wt Readings from Last 1 Encounters:  07/10/23 78.5 kg    Ideal Body Weight:  72.7 kg  BMI:  Body mass index is 25.55 kg/m.  Estimated Nutritional Needs:  Kcal:  1800-2100 kcal/d Protein:  90-105g/d Fluid:  1.8-2L/d    Edwena Graham, RD, LDN, CNSC Registered Dietitian II Please reach out via secure chat

## 2023-07-12 DIAGNOSIS — J471 Bronchiectasis with (acute) exacerbation: Secondary | ICD-10-CM

## 2023-07-12 DIAGNOSIS — E43 Unspecified severe protein-calorie malnutrition: Secondary | ICD-10-CM | POA: Insufficient documentation

## 2023-07-12 DIAGNOSIS — Z87891 Personal history of nicotine dependence: Secondary | ICD-10-CM

## 2023-07-12 MED ORDER — AZITHROMYCIN 500 MG PO TABS
500.0000 mg | ORAL_TABLET | ORAL | Status: DC
  Administered 2023-07-12: 500 mg via ORAL
  Filled 2023-07-12 (×2): qty 1

## 2023-07-12 MED ORDER — PREDNISONE 20 MG PO TABS
40.0000 mg | ORAL_TABLET | Freq: Every day | ORAL | Status: DC
  Administered 2023-07-13: 40 mg via ORAL
  Filled 2023-07-12: qty 2

## 2023-07-12 NOTE — Progress Notes (Signed)
 PROGRESS NOTE    Brandon Sanford  FMW:[id number] DOB: Dec 03, 1940 DOA: 07/10/2023 PCP: Leonel Cole, MD   Brief Narrative:   83 y.o. male with history of COPD/bronchiectasis, hypertension, hyperlipidemia, hypothyroidism presents to the ER with worsening shortness of breath.  Patient had come to the urgent care on 06/26/2023 with symptoms of bronchitis was prescribed Augmentin  and prednisone  and followed up with patient's pulmonologist Dr. Darlean on 06/30/2023 when patient's inhalers were changed and Prilosec was added.  Patient's shortness of breath was worsening patient presents to the ER   Assessment & Plan:  Principal Problem:   COPD exacerbation (HCC) Active Problems:   Hypothyroidism   Essential hypertension   Stage 3a chronic kidney disease (CKD) (HCC)   HLD (hyperlipidemia)   Protein-calorie malnutrition, severe   COPD exacerbation/bronchiectasis  ARF with hypoxia: He was on placed on 2 L nasal oxygen initially, off of oxygen now. Uses Breztri  at home Pulm on board.  -flutter valve - IV steroids transitioned to oral prednisone  40 mg x 5 days -nebs -continue IV rocephin  and azithromycin  -Sputum cultures   Near occlusion of right upper lobe bronchus: -Pulmonology on board. -chest PT, flutter valve   Hypertension  -on amlodipine  and hydrochlorothiazide .   Hyperlipidemia  -on statins.   Hypothyroidism  -on Synthroid .   Chronic kidney disease stage IIIA -Cr stable   DVT prophylaxis: enoxaparin  (LOVENOX ) injection 40 mg Start: 07/11/23 1000     Code Status: Full Code Family Communication:   Status is: Inpatient Remains inpatient appropriate because: Respiratory failure    Subjective:  He was getting breathing treatment this morning. Feels better in terms of his shortness of breath. He is on breztri  and prn albuterol  inhalers at home. He does have nebulizer machine. Lives with his wife.   Examination:  General exam: Appears calm and comfortable  Respiratory  system: Slight tachypnea with expiratory b/l wheezing Cardiovascular system: S1 & S2 heard, RRR. No JVD, murmurs, rubs, gallops or clicks. No pedal edema. Gastrointestinal system: Abdomen is nondistended, soft and nontender. No organomegaly or masses felt. Normal bowel sounds heard. Central nervous system: Alert and oriented. No focal neurological deficits. Extremities: Symmetric 5 x 5 power. Skin: No rashes, lesions or ulcers Psychiatry: Judgement and insight appear normal. Mood & affect appropriate.     Diet Orders (From admission, onward)     Start     Ordered   07/11/23 1436  Diet regular Room service appropriate? Yes; Fluid consistency: Thin  Diet effective now       Question Answer Comment  Room service appropriate? Yes   Fluid consistency: Thin      07/11/23 1436            Objective: Vitals:   07/12/23 0508 07/12/23 0730 07/12/23 0845 07/12/23 1146  BP: 123/70 139/66  117/77  Pulse: 70 86  90  Resp: 20 17  17   Temp: 97.9 F (36.6 C) 98.1 F (36.7 C)  97.7 F (36.5 C)  TempSrc: Oral Oral  Oral  SpO2: 99% 97% 98% 94%  Weight:      Height:        Intake/Output Summary (Last 24 hours) at 07/12/2023 1319 Last data filed at 07/12/2023 0800 Gross per 24 hour  Intake 830 ml  Output --  Net 830 ml   Filed Weights   07/10/23 1443 07/11/23 1800  Weight: 78.5 kg 74.4 kg    Scheduled Meds:  amLODipine   5 mg Oral Daily   azithromycin   500 mg Oral Q24H  budesonide  (PULMICORT ) nebulizer solution  0.25 mg Nebulization BID   enoxaparin  (LOVENOX ) injection  40 mg Subcutaneous Q24H   feeding supplement  237 mL Oral TID BM   hydrochlorothiazide   12.5 mg Oral Daily   ipratropium-albuterol   3 mL Nebulization Q6H   levothyroxine   100 mcg Oral Q0600   methylPREDNISolone  (SOLU-MEDROL ) injection  80 mg Intravenous Q24H   multivitamin with minerals  1 tablet Oral Daily   simvastatin   40 mg Oral QPM   sodium chloride  HYPERTONIC  4 mL Nebulization BID   Continuous  Infusions:  cefTRIAXone  (ROCEPHIN )  IV 1 g (07/12/23 1310)    Nutritional status Signs/Symptoms: severe muscle depletion, severe fat depletion Interventions: MVI, Liberalize Diet, Ensure Enlive (each supplement provides 350kcal and 20 grams of protein) Body mass index is 24.23 kg/m.  Data Reviewed:   CBC: Recent Labs  Lab 07/10/23 1456 07/11/23 0100  WBC 12.9* 10.0  NEUTROABS 10.2* 7.0  HGB 14.3 12.4*  HCT 43.2 37.1*  MCV 88.5 89.2  PLT 389 342   Basic Metabolic Panel: Recent Labs  Lab 07/10/23 1456 07/11/23 0100  NA 138 136  K 3.9 3.7  CL 101 105  CO2 24 24  GLUCOSE 109* 121*  BUN 24* 24*  CREATININE 1.39* 1.34*  CALCIUM  10.2 9.2   GFR: Estimated Creatinine Clearance: 42.5 mL/min (A) (by C-G formula based on SCr of 1.34 mg/dL (H)). Liver Function Tests: Recent Labs  Lab 07/10/23 1456  AST 15  ALT 18  ALKPHOS 103  BILITOT 0.5  PROT 7.1  ALBUMIN 3.9   No results for input(s): LIPASE, AMYLASE in the last 168 hours. No results for input(s): AMMONIA in the last 168 hours. Coagulation Profile: No results for input(s): INR, PROTIME in the last 168 hours. Cardiac Enzymes: No results for input(s): CKTOTAL, CKMB, CKMBINDEX, TROPONINI in the last 168 hours. BNP (last 3 results) No results for input(s): PROBNP in the last 8760 hours. HbA1C: No results for input(s): HGBA1C in the last 72 hours. CBG: No results for input(s): GLUCAP in the last 168 hours. Lipid Profile: No results for input(s): CHOL, HDL, LDLCALC, TRIG, CHOLHDL, LDLDIRECT in the last 72 hours. Thyroid  Function Tests: No results for input(s): TSH, T4TOTAL, FREET4, T3FREE, THYROIDAB in the last 72 hours. Anemia Panel: No results for input(s): VITAMINB12, FOLATE, FERRITIN, TIBC, IRON, RETICCTPCT in the last 72 hours. Sepsis Labs: No results for input(s): PROCALCITON, LATICACIDVEN in the last 168 hours.  Recent Results (from the past  240 hours)  Expectorated Sputum Assessment w Gram Stain, Rflx to Resp Cult     Status: None   Collection Time: 07/11/23  3:09 PM   Specimen: Expectorated Sputum  Result Value Ref Range Status   Specimen Description EXPECTORATED SPUTUM  Final   Special Requests NONE  Final   Sputum evaluation   Final    THIS SPECIMEN IS ACCEPTABLE FOR SPUTUM CULTURE Performed at [facility], [address], [place], [place] [address]    Report Status 07/11/2023 FINAL  Final  Culture, Respiratory w Gram Stain     Status: None (Preliminary result)   Collection Time: 07/11/23  3:09 PM  Result Value Ref Range Status   Specimen Description EXPECTORATED SPUTUM  Final   Special Requests NONE Reflexed from [id number]  Final   Gram Stain   Final    ABUNDANT WBC PRESENT, PREDOMINANTLY PMN RARE GRAM POSITIVE COCCI    Culture   Final    CULTURE REINCUBATED FOR BETTER GROWTH Performed at [facility], [address] [place], [place] [address]    Report Status PENDING  Incomplete         Radiology Studies: CT Angio Chest PE W and/or Wo Contrast Result Date: 07/10/2023 CLINICAL DATA:  Worsening shortness of breath EXAM: CT ANGIOGRAPHY CHEST WITH CONTRAST TECHNIQUE: Multidetector CT imaging of the chest was performed using the standard protocol during bolus administration of intravenous contrast. Multiplanar CT image reconstructions and MIPs were obtained to evaluate the vascular anatomy. RADIATION DOSE REDUCTION: This exam was performed according to the departmental dose-optimization program which includes automated exposure control, adjustment of the mA and/or kV according to patient size and/or use of iterative reconstruction technique. CONTRAST:  75mL OMNIPAQUE  IOHEXOL  350 MG/ML SOLN COMPARISON:  08/21/2021 and chest radiograph 07/09/2021 FINDINGS: Cardiovascular: No filling defect is identified in the pulmonary arterial tree to suggest pulmonary embolus. Coronary, aortic arch, and branch vessel  atherosclerotic vascular disease. Mediastinum/Nodes: Prevascular node 1.1 cm in short axis on image 28 series 302, formerly the same. Right hilar node 1.1 cm in short axis on image 66 series 302, formerly 1.0 cm. Left hilar node 1.2 cm in short axis on image 68 series 302, difficult to measure on prior exam due to lack of IV contrast but suspected 1.2 cm (stable). Small type 1 hiatal hernia. Lungs/Pleura: Emphysema. Airway thickening and mild airway plugging in both lower lobes with mild associated atelectasis. There is also some mucous plugging and atelectasis in the lingula. Peribronchovascular nodularity in the left lower lobe is probably from infrahilar lymph nodes. Near occlusion of the right upper lobe bronchus due to nodularity and bronchial wall thickening on images 59-63 of series 303. Upper Abdomen: Bilateral fluid density renal cysts along the upper poles. Atheromatous plaque at the origins of the celiac trunk and SMA. There is some cephalad extension of small bowel loops anterior to the left hepatic lobe. Musculoskeletal: Thoracic spondylosis. Review of the MIP images confirms the above findings. IMPRESSION: 1. No filling defect is identified in the pulmonary arterial tree to suggest pulmonary embolus. 2. Airway thickening and mild airway plugging in both lower lobes with mild associated atelectasis. There is also some mucous plugging and atelectasis in the lingula. 3. Near occlusion of the right upper lobe bronchus due to nodularity and bronchial wall thickening. Bronchoscopy or short-term follow up CT scan is recommended to exclude underlying neoplasm. 4. Peribronchovascular nodularity in the left lower lobe is probably from infrahilar lymph nodes. 5. Mild mediastinal and hilar adenopathy, roughly similar to 08/21/2021, possibly reactive but technically nonspecific. 6. Small type 1 hiatal hernia. 7. Bilateral fluid density renal cysts along the upper poles. 8. Thoracic spondylosis. 9. Aortic  Atherosclerosis (ICD10-I70.0) and Emphysema (ICD10-J43.9). Electronically Signed   By: Ryan Salvage M.D.   On: 07/10/2023 17:01   DG Chest 1 View Result Date: 07/10/2023 CLINICAL DATA:  Chest tightness. EXAM: CHEST  1 VIEW COMPARISON:  June 26, 2023 FINDINGS: The heart size and mediastinal contours are within normal limits. Mild linear scarring and/or atelectasis is seen within the bilateral lung bases. There is no evidence of focal consolidation, pleural effusion or pneumothorax. Multilevel degenerative changes seen throughout the thoracic spine. IMPRESSION: Mild bibasilar linear scarring and/or atelectasis. Electronically Signed   By: Suzen Dials M.D.   On: 07/10/2023 16:09        LOS: 2 days   Time spent= 40 mins    Deliliah Room, MD [facility]  If 7PM-7AM, please contact night-coverage  07/12/2023, 1:19 PM

## 2023-07-12 NOTE — Progress Notes (Signed)
   NAME:  Brandon Sanford, MRN:  [id number], DOB:  [birth year], LOS: 2 ADMISSION DATE:  07/10/2023, CONSULTATION DATE: 07/11/2023 REFERRING MD: [facility], CHIEF COMPLAINT: Shortness of breath  History of Present Illness:  83 year old male who quit smoking 10 years ago and is followed by Dr. Darlean for Gold 2 COPD with bronchiectasis with COVID x 2.  He was in his usual state of health until approximately 3 weeks ago.  Fevers chills sweats purulent green sputum he went to his primary care physician and was treated with Zithromax  with some improvement.  He did not improve adequately and went to [facility] at which time he was given IV antibiotics IV steroids CT of the chest and transferred to [facility] for further evaluation and treatment.  He does have an incentive spirometer but does not have a flutter valve.  Being treated adequately with bronchodilators and IV antibiotics and reports that he feels much better.  He would like to establish with another pulmonary physician besides Dr. Darlean.  Pertinent  Medical History   Past Medical History:  Diagnosis Date   Allergy    Arthritis    Asthma    as a child   Cancer (HCC)    skin - neck  pre- cancer   COPD (chronic obstructive pulmonary disease) (HCC)    Depression    pt denies   Dyspnea    With exertion   GERD (gastroesophageal reflux disease)    tums if needed   Hyperlipidemia    Hypertension    Hypothyroidism    Inguinal hernia    Plantar fasciitis    Skin moles    Thyroid  disease    Umbilical hernia    repaired     Significant Hospital Events: Including procedures, antibiotic start and stop dates in addition to other pertinent events     Interim History / Subjective:  Feeling better this morning. Hasn't yet started flutter valve  Objective    Blood pressure 117/77, pulse 90, temperature 97.7 F (36.5 C), temperature source Oral, resp. rate 17, height 5' 9 (1.753 m), weight 74.4 kg, SpO2 96%.         Intake/Output Summary (Last 24 hours) at 07/12/2023 1456 Last data filed at 07/12/2023 0800 Gross per 24 hour  Intake 830 ml  Output --  Net 830 ml   Filed Weights   07/10/23 1443 07/11/23 1800  Weight: 78.5 kg 74.4 kg    Examination: Well appearing elderly man No respiratory distress, on room air Diminished, Coughing with deep inspiration and some end expiratory wheezes RRR no mrg No peripheral edema Normal speech no focal asymmetry  Resolved problem list   Assessment and Plan   COPD with acute exacerbation Bronchiectasis History of tobacco use - failed outpatient therapy - continue azithromycin  and ceftriaxone  as ordered - will follow sputum culture - he is now off oxygen - continue steroids.  - I have added the flutter valve and scheduled chest pt for today/tomorrow - suspect he could go home as soon as tomorrow.  - will set him up in the office for follow up  Hypertension Hyperlipidemia Hypothyroidism per primary  PCCM will follow.   Verdon Gore, MD Pulmonary and Critical Care Medicine [facility] 07/12/2023 2:56 PM Pager: see AMION  If no response to pager, please call critical care on call (see AMION) until 7pm After 7:00 pm call Elink

## 2023-07-12 NOTE — Progress Notes (Signed)
 Plan of care has been reviewed. Pt is progressing. He is alert and fully oriented x 4, stable hemodynamically, afebrile, on 2 LPM of O2 NCL for comfort. He is able to ambulate independently in his room, well tolerated. Pt has mild expiratory wheezing on mid bronchial area on auscultations. Breathing treatment is provided q 6 hrs by RT and effective incentive spirometer for breathing exercise is encouraged. No productive cough, no obvious acute respiratory distress noted. We will continue to monitor.   Wendi Dash, RN

## 2023-07-12 NOTE — Progress Notes (Signed)
 RT gave Duoneb and educated Pt on flutter valve. RT coached Pt on flutter and tolerated well.

## 2023-07-13 ENCOUNTER — Telehealth: Payer: Self-pay | Admitting: Internal Medicine

## 2023-07-13 LAB — ACID FAST SMEAR (AFB, MYCOBACTERIA): Acid Fast Smear: NEGATIVE

## 2023-07-13 MED ORDER — PREDNISONE 20 MG PO TABS: 40.0000 mg | ORAL_TABLET | Freq: Every day | ORAL | 0 refills | Status: AC

## 2023-07-13 MED ORDER — ENSURE PLUS HIGH PROTEIN PO LIQD: 237.0000 mL | Freq: Three times a day (TID) | ORAL | 0 refills | Status: AC

## 2023-07-13 MED ORDER — AZITHROMYCIN 500 MG PO TABS: 500.0000 mg | ORAL_TABLET | ORAL | 0 refills | Status: AC

## 2023-07-13 NOTE — Progress Notes (Signed)
D/c tele and IV. Went over AVS with pt and all questions were addressed.   Lavenia Atlas, RN

## 2023-07-13 NOTE — Telephone Encounter (Signed)
 Hospitalized for COPD exacerbation. Needs f/u 2-3 weeks.

## 2023-07-13 NOTE — Discharge Summary (Signed)
 Physician Discharge Summary   Patient: Brandon Sanford MRN: [id number] DOB: May 20, 1940  Admit date:     07/10/2023  Discharge date: 07/13/23  Discharge Physician: Deliliah Room   PCP: Leonel Cole, MD   Recommendations at discharge:    Follow up with your PCP in one week.  Follow up out patient with Pulm in 2-3 weeks.  Continue taking meds as prescribed  Discharge Diagnoses: Principal Problem:   COPD exacerbation (HCC) Active Problems:   Hypothyroidism   Essential hypertension   Stage 3a chronic kidney disease (CKD) (HCC)   HLD (hyperlipidemia)   Protein-calorie malnutrition, severe  Resolved Problems:   * No resolved hospital problems. *  Hospital Course:  83 y.o. male with history of COPD/bronchiectasis, hypertension, hyperlipidemia, hypothyroidism presents to the ER with worsening shortness of breath.  Patient had come to the urgent care on 06/26/2023 with symptoms of bronchitis was prescribed Augmentin  and prednisone  and followed up with patient's pulmonologist Dr. Darlean on 06/30/2023 when patient's inhalers were changed and Prilosec was added.  Patient's shortness of breath was worsening so he went to the [facility] [place] ER and then sent to [facility] for [facility].  Gold 2 COPD exacerbation/bronchiectasis, failed out patient therapy ARF with hypoxia: He was on placed on 2 L nasal oxygen initially, off of oxygen now. Uses Breztri  at home Pulm on board. -flutter valve - IV steroids transitioned to oral prednisone  40 mg x 5 days. Prescription given on discharge. -continue nebs at home. -Received IV rocephin  and azithromycin . Dced on oral azithromycin . -f/u Sputum cultures   Near occlusion of right upper lobe bronchus: Resolved -Seen by Dr Verdon Gore in the hospital. -Received chest PT, flutter valve. He will follow up out patient with pulm in 2-3 weeks.   Hypertension  -on amlodipine  and hydrochlorothiazide .   Hyperlipidemia  -on statins.   Hypothyroidism   -on Synthroid .   Chronic kidney disease stage IIIA: Stable  Disposition: Home. Lives with his wife.      Consultants: Pulm Procedures performed: None   Disposition: Home Diet recommendation:  Cardiac diet DISCHARGE MEDICATION: Allergies as of 07/13/2023       Reactions   Lisinopril Cough   Chlorhexidine  Rash   Pre-surgery soap   Pneumococcal Vaccines Rash        Medication List     STOP taking these medications    amoxicillin -clavulanate 875-125 MG tablet Commonly known as: AUGMENTIN        TAKE these medications    albuterol  (2.5 MG/3ML) 0.083% nebulizer solution Commonly known as: PROVENTIL  Use one half to one vial every 4 hours as needed What changed:  how much to take how to take this when to take this reasons to take this additional instructions   albuterol  108 (90 Base) MCG/ACT inhaler Commonly known as: VENTOLIN  HFA 1- 2puffs every 4 hours if needed What changed:  how much to take how to take this when to take this reasons to take this   amLODipine  5 MG tablet Commonly known as: NORVASC  Take 5 mg by mouth daily.   azithromycin  500 MG tablet Commonly known as: ZITHROMAX  Take 1 tablet (500 mg total) by mouth daily for 5 days.   Breztri  Aerosphere 160-9-4.8 MCG/ACT Aero inhaler Generic drug: budesonide -glycopyrrolate -formoterol  Inhale 2 puffs into the lungs in the morning and at bedtime.   feeding supplement Liqd Take 237 mLs by mouth 3 (three) times daily between meals.   hydrochlorothiazide  12.5 MG capsule Commonly known as: MICROZIDE  Take 12.5 mg  by mouth daily.   levothyroxine  100 MCG tablet Commonly known as: SYNTHROID  Take 100 mcg by mouth daily before breakfast.   menthol -cetylpyridinium 3 MG lozenge Commonly known as: CEPACOL Take 1 lozenge by mouth daily as needed for sore throat.   multivitamin tablet Take 1 tablet by mouth daily.   Polyethyl Glycol-Propyl Glycol 0.4-0.3 % Soln Place 1-2 drops into both eyes 3  (three) times daily as needed (dry eyes/irritated eyes).   predniSONE  20 MG tablet Commonly known as: DELTASONE  Take 2 tablets (40 mg total) by mouth daily with breakfast for 5 days. Start taking on: July 14, 2023   simvastatin  40 MG tablet Commonly known as: ZOCOR  Take 40 mg by mouth every evening.   Tums 500 MG chewable tablet Generic drug: calcium  carbonate Chew 500 mg by mouth 2 (two) times daily as needed for indigestion or heartburn.        Follow-up Information     Leonel Cole, MD Follow up in 1 week(s).   Specialty: Family Medicine Contact information: [address] [place] [address] [phone]         Meade Verdon RAMAN, MD. Schedule an appointment as soon as possible for a visit in 2 week(s).   Specialty: Pulmonary Disease Contact information: [address] [place] [address] [phone]                Discharge Exam: Fredricka Weights   07/10/23 1443 07/11/23 1800  Weight: 78.5 kg 74.4 kg   Constitutional: NAD, calm, comfortable Eyes: PERRL, lids and conjunctivae normal ENMT: Mucous membranes are moist. Posterior pharynx clear of any exudate or lesions.Normal dentition.  Neck: normal, supple, no masses, no thyromegaly Respiratory: slight wheezing, no crackles. Normal respiratory effort. No accessory muscle use.  Cardiovascular: Regular rate and rhythm, no murmurs / rubs / gallops. No extremity edema. 2+ pedal pulses. No carotid bruits.  Abdomen: no tenderness, no masses palpated. No hepatosplenomegaly. Bowel sounds positive.  Musculoskeletal: no clubbing / cyanosis. No joint deformity upper and lower extremities. Good ROM, no contractures. Normal muscle tone.  Skin: no rashes, lesions, ulcers. No induration Neurologic: CN 2-12 grossly intact. Sensation intact, DTR normal. Strength 5/5 x all 4 extremities.  Psychiatric: Normal judgment and insight. Alert and oriented x 3. Normal mood.    Condition at discharge:  good  The results of significant diagnostics from this hospitalization (including imaging, microbiology, ancillary and laboratory) are listed below for reference.   Imaging Studies: CT Angio Chest PE W and/or Wo Contrast Result Date: 07/10/2023 CLINICAL DATA:  Worsening shortness of breath EXAM: CT ANGIOGRAPHY CHEST WITH CONTRAST TECHNIQUE: Multidetector CT imaging of the chest was performed using the standard protocol during bolus administration of intravenous contrast. Multiplanar CT image reconstructions and MIPs were obtained to evaluate the vascular anatomy. RADIATION DOSE REDUCTION: This exam was performed according to the departmental dose-optimization program which includes automated exposure control, adjustment of the mA and/or kV according to patient size and/or use of iterative reconstruction technique. CONTRAST:  75mL OMNIPAQUE  IOHEXOL  350 MG/ML SOLN COMPARISON:  08/21/2021 and chest radiograph 07/09/2021 FINDINGS: Cardiovascular: No filling defect is identified in the pulmonary arterial tree to suggest pulmonary embolus. Coronary, aortic arch, and branch vessel atherosclerotic vascular disease. Mediastinum/Nodes: Prevascular node 1.1 cm in short axis on image 28 series 302, formerly the same. Right hilar node 1.1 cm in short axis on image 66 series 302, formerly 1.0 cm. Left hilar node 1.2 cm in short axis on image 68 series 302,  difficult to measure on prior exam due to lack of IV contrast but suspected 1.2 cm (stable). Small type 1 hiatal hernia. Lungs/Pleura: Emphysema. Airway thickening and mild airway plugging in both lower lobes with mild associated atelectasis. There is also some mucous plugging and atelectasis in the lingula. Peribronchovascular nodularity in the left lower lobe is probably from infrahilar lymph nodes. Near occlusion of the right upper lobe bronchus due to nodularity and bronchial wall thickening on images 59-63 of series 303. Upper Abdomen: Bilateral fluid density renal  cysts along the upper poles. Atheromatous plaque at the origins of the celiac trunk and SMA. There is some cephalad extension of small bowel loops anterior to the left hepatic lobe. Musculoskeletal: Thoracic spondylosis. Review of the MIP images confirms the above findings. IMPRESSION: 1. No filling defect is identified in the pulmonary arterial tree to suggest pulmonary embolus. 2. Airway thickening and mild airway plugging in both lower lobes with mild associated atelectasis. There is also some mucous plugging and atelectasis in the lingula. 3. Near occlusion of the right upper lobe bronchus due to nodularity and bronchial wall thickening. Bronchoscopy or short-term follow up CT scan is recommended to exclude underlying neoplasm. 4. Peribronchovascular nodularity in the left lower lobe is probably from infrahilar lymph nodes. 5. Mild mediastinal and hilar adenopathy, roughly similar to 08/21/2021, possibly reactive but technically nonspecific. 6. Small type 1 hiatal hernia. 7. Bilateral fluid density renal cysts along the upper poles. 8. Thoracic spondylosis. 9. Aortic Atherosclerosis (ICD10-I70.0) and Emphysema (ICD10-J43.9). Electronically Signed   By: Ryan Salvage M.D.   On: 07/10/2023 17:01   DG Chest 1 View Result Date: 07/10/2023 CLINICAL DATA:  Chest tightness. EXAM: CHEST  1 VIEW COMPARISON:  June 26, 2023 FINDINGS: The heart size and mediastinal contours are within normal limits. Mild linear scarring and/or atelectasis is seen within the bilateral lung bases. There is no evidence of focal consolidation, pleural effusion or pneumothorax. Multilevel degenerative changes seen throughout the thoracic spine. IMPRESSION: Mild bibasilar linear scarring and/or atelectasis. Electronically Signed   By: Suzen Dials M.D.   On: 07/10/2023 16:09   DG Chest 2 View Result Date: 06/26/2023 CLINICAL DATA:  Cough, shortness of breath EXAM: CHEST - 2 VIEW COMPARISON:  August 02, 2022 FINDINGS: The heart size  and mediastinal contours are within normal limits. Both lungs are clear. The visualized skeletal structures are unremarkable. IMPRESSION: COPD with basilar hypoventilatory atelectasis. No acute cardiopulmonary infiltrates Electronically Signed   By: Franky Chard M.D.   On: 06/26/2023 08:43    Microbiology: Results for orders placed or performed during the hospital encounter of 07/10/23  Expectorated Sputum Assessment w Gram Stain, Rflx to Resp Cult     Status: None   Collection Time: 07/11/23  3:09 PM   Specimen: Expectorated Sputum  Result Value Ref Range Status   Specimen Description EXPECTORATED SPUTUM  Final   Special Requests NONE  Final   Sputum evaluation   Final    THIS SPECIMEN IS ACCEPTABLE FOR SPUTUM CULTURE Performed at [facility], [address], [place], [place] [address]    Report Status 07/11/2023 FINAL  Final  Culture, Respiratory w Gram Stain     Status: None (Preliminary result)   Collection Time: 07/11/23  3:09 PM  Result Value Ref Range Status   Specimen Description EXPECTORATED SPUTUM  Final   Special Requests NONE Reflexed from [id number]  Final   Gram Stain   Final    ABUNDANT WBC PRESENT, PREDOMINANTLY PMN RARE  GRAM POSITIVE COCCI    Culture   Final    ABUNDANT STREPTOCOCCUS PARASANGUINIS SUSCEPTIBILITIES TO FOLLOW Performed at [facility], [address] [place], [place] [address]    Report Status PENDING  Incomplete    Labs: CBC: Recent Labs  Lab 07/10/23 1456 07/11/23 0100  WBC 12.9* 10.0  NEUTROABS 10.2* 7.0  HGB 14.3 12.4*  HCT 43.2 37.1*  MCV 88.5 89.2  PLT 389 342   Basic Metabolic Panel: Recent Labs  Lab 07/10/23 1456 07/11/23 0100  NA 138 136  K 3.9 3.7  CL 101 105  CO2 24 24  GLUCOSE 109* 121*  BUN 24* 24*  CREATININE 1.39* 1.34*  CALCIUM  10.2 9.2   Liver Function Tests: Recent Labs  Lab 07/10/23 1456  AST 15  ALT 18  ALKPHOS 103  BILITOT 0.5  PROT 7.1  ALBUMIN 3.9   CBG: No results for input(s):  GLUCAP in the last 168 hours.  Discharge time spent: greater than 30 minutes.  Signed: Deliliah Room, MD [facility] 07/13/2023

## 2023-07-14 ENCOUNTER — Other Ambulatory Visit: Payer: Self-pay | Admitting: Internal Medicine

## 2023-07-14 LAB — CULTURE, RESPIRATORY W GRAM STAIN

## 2023-07-14 MED ORDER — LEVOFLOXACIN 750 MG PO TABS: 750.0000 mg | ORAL_TABLET | Freq: Every day | ORAL | 0 refills | Status: AC

## 2023-07-14 NOTE — Telephone Encounter (Signed)
 Pt scheduled 6/24 with [facility].

## 2023-07-14 NOTE — Telephone Encounter (Signed)
 Patient scheduled 6/24 with [facility].

## 2023-07-14 NOTE — Progress Notes (Signed)
 Sputum culture came back positive for STREPTOCOCCUS PARASANGUINIS, sensitive to levofloxacin . Prescription was sent to patient's pharmacy. Patient was discharged on oral azithromycin .I called the patient and informed about the change in antibiotic.

## 2023-07-15 ENCOUNTER — Encounter: Payer: Self-pay | Admitting: Nurse Practitioner

## 2023-07-15 ENCOUNTER — Ambulatory Visit: Admitting: Nurse Practitioner

## 2023-07-15 VITALS — BP 130/79 | HR 80 | Ht 69.0 in | Wt 167.8 lb

## 2023-07-15 DIAGNOSIS — J189 Pneumonia, unspecified organism: Secondary | ICD-10-CM

## 2023-07-15 DIAGNOSIS — J471 Bronchiectasis with (acute) exacerbation: Secondary | ICD-10-CM | POA: Insufficient documentation

## 2023-07-15 DIAGNOSIS — B370 Candidal stomatitis: Secondary | ICD-10-CM | POA: Diagnosis not present

## 2023-07-15 DIAGNOSIS — J449 Chronic obstructive pulmonary disease, unspecified: Secondary | ICD-10-CM | POA: Diagnosis not present

## 2023-07-15 DIAGNOSIS — Z87891 Personal history of nicotine dependence: Secondary | ICD-10-CM

## 2023-07-15 MED ORDER — NYSTATIN 100000 UNIT/ML MT SUSP: 5.0000 mL | Freq: Four times a day (QID) | OROMUCOSAL | 0 refills | Status: AC

## 2023-07-15 NOTE — Patient Instructions (Addendum)
 Continue Albuterol  inhaler 2 puffs or 3 mL neb every 6 hours as needed for shortness of breath or wheezing. Notify if symptoms persist despite rescue inhaler/neb use. Use nebs 2-3 times a day and then follow with flutter valve 10 times  Continue Breztri  2 puffs Twice daily. Brush tongue and rinse mouth afterwards Complete levofloxacin  as prescribed  Complete prednisone   Restart Mucinex  DM Twice daily for the next week then as needed for cough/congestion  Nystatin rinses 4 times a day for 10 days, swish and swallow.   Repeat CT chest in 6-8 weeks  I am going to put orders in for vest therapy for you to use twice daily   Follow up in 8-10 weeks with Dr. Darlean or Katie Mckynlie Vanderslice,NP. If symptoms do not improve or worsen, please contact office for sooner follow up or seek emergency care.

## 2023-07-15 NOTE — Assessment & Plan Note (Signed)
 Hospitalization for pneumonia. Discharged on azithromycin . Sputum culture grew out macrolide resistant strep parasanguinius. He has been changed to levofloxacin  with 4 days left. Clinically improving. Continue mucociliary clearance therapies. Strict return/ED precautions. Resolved hypoxia. Repeat CT chest in 6-8 weeks for resolution.  Patient Instructions  Continue Albuterol  inhaler 2 puffs or 3 mL neb every 6 hours as needed for shortness of breath or wheezing. Notify if symptoms persist despite rescue inhaler/neb use. Use nebs 2-3 times a day and then follow with flutter valve 10 times  Continue Breztri  2 puffs Twice daily. Brush tongue and rinse mouth afterwards Complete levofloxacin  as prescribed  Complete prednisone   Restart Mucinex  DM Twice daily for the next week then as needed for cough/congestion  Nystatin rinses 4 times a day for 10 days, swish and swallow.   Repeat CT chest in 6-8 weeks  I am going to put orders in for vest therapy for you to use twice daily   Follow up in 8-10 weeks with Dr. Darlean or Katie Karilynn Carranza,NP. If symptoms do not improve or worsen, please contact office for sooner follow up or seek emergency care.

## 2023-07-15 NOTE — Assessment & Plan Note (Signed)
 See above. Continue current regimen. Action plan in place.

## 2023-07-15 NOTE — Assessment & Plan Note (Signed)
 See above. Clinically improved with resolving exacerbation. Will start him on vest therapy. Continue mucociliary clearance and bronchodilator therapies.   Patient has has daily productive cough lasting greater than 6  months requiring antibiotic therapy.  Flutter valve has been tried, but has not effectively mobilized secretions.  Considered and ruled out manual CPT as no consistent skilled caregiver available to perform therapy.  CT scan performed on 07/11/2023 confirms bronchiectasis.  Starting patient on vest therapy.

## 2023-07-15 NOTE — Assessment & Plan Note (Signed)
 Thrush on exam. Add on nystatin rinses. Oral hygiene reviewed.

## 2023-07-15 NOTE — Progress Notes (Signed)
 @Patient  ID: Brandon Sanford, male    DOB: July 09, 1940, 83 y.o.   MRN: [id number]  Chief Complaint  Patient presents with   Follow-up    COPD, PT is feeling better today.    Referring provider: Leonel Cole, MD  HPI: 83 year old male, former smoker (40 pack years) followed for COPD GOLD II and bronchiectasis.  He is a patient of Dr. Chari and was last seen in office on 06/30/2023.  Past medical history significant for hypertension, osteoarthritis, depression.  TEST/EVENTS:  06/03/2007 PFTs: FEV1 56%, ratio 40%, 27% improvement after bronchodilators and DLCO 87% 03/02/2007 CT chest: Bronchiectasis left lower lobe 07/22/2020 CXR 2 view: Right base airspace opacity compatible with pneumonia.  New nodular density left lung base. 12/20/2020 CXR 2 view: Atherosclerosis.  The lungs are hyperinflated with bibasilar scarring.  Previous subcentimeter nodular density of the left lung base not well seen on current exam.  No acute airspace consolidation, edema, pleural effusion, pneumothorax. 09/07/2020 CBC: Hemoglobin 11, HCT 33.8, eos 300 09/07/2020 IgE: 65 08/21/2021 CT chest: atherosclerosis. Emphysema. Mild bronchial wall thickening. 5 mm nodule in RUL, unchanged. Nodules in RML and LLL. B/l renal cysts.  07/10/2023 CTA chest: no PE. Atherosclerosis. Prevascular node 1.1 cm, formerly the same. Right hilar node, formerly 1 cm. Left hilar node 1.2 cm. Small type hiatal hernia. Emphysema. Airway thickening and mild airway plugging in both lower lobes. Mucous plugging and atelectasis in lingula. Peribronchovascular nodularity in LLL. Near occlusion of RUL. 07/11/2023 sputum culture: strep parasanguinis, resistant to erythromycin and intermediate to pcn  06/30/2023: OV with Dr. Darlean. Went to UC 6/5 - fevers, productive cough. CXR without acute process - rx augmentin  and prednisone . Fever resolved. Appetite back and breathing better. Start prilosec and continue max mucinex  DM.   07/15/2023: Today - follow up Discussed  the use of AI scribe software for clinical note transcription with the patient, who gave verbal consent to proceed.  History of Present Illness   Brandon Sanford is an 83 year old male who presents for follow-up after hospitalization for a respiratory infection. He was admitted 07/10/2023-07/13/2023 for acute respiratory failure secondary to pneumonia and AECOPD/btx. He had near occlusion of RUL bronchus. He was treated with ceftriaxone  and azithromycin . Transitioned to oral azithromycin  and prednisone  at discharge.   Sputum culture grew out strep parasanguinus, resistant to erythromycin and intermediate to pcn. AFB still pending. He was contacted yesterday by the hospitalist and advised to stop azithromycin  and start levofloxacin  for 5 days. He took the first dose last night.   He feels better with improved breathing and is using a flutter valve device to aid in clearing his lungs. He is currently on prednisone  with three days remaining in the course. No fever, chills, or hemoptysis. Appetite is good. He expectorates a small amount of cloudy, grayish sputum and notes some wheezing. Sputum color improved/decreased.   He is using his flutter valve a few times a day. Uses Breztri  twice daily. He has not used a nebulizer or taken Mucinex  since discharge.   He does have some mild soreness to the roof of his mouth, which he had attributed to his dentures.   Does not have vest therapy at home.    Allergies  Allergen Reactions   Lisinopril Cough   Chlorhexidine  Rash    Pre-surgery soap   Pneumococcal Vaccines Rash    Immunization History  Administered Date(s) Administered   Fluad Quad(high Dose 65+) 10/18/2020   Influenza, High Dose Seasonal PF 09/26/2016, 10/02/2018  Influenza-Unspecified 11/21/2000, 11/22/2003, 09/21/2004, 12/21/2004, 11/21/2005, 10/12/2008, 10/21/2009, 12/22/2010, 10/22/2011, 11/21/2012, 10/25/2013, 09/29/2015, 10/21/2016, 10/08/2017, 09/28/2020   Moderna Sars-Covid-2  Vaccination 01/28/2019, 02/25/2019, 05/22/2019, 06/20/2019, 10/22/2019, 11/22/2019   Pneumococcal Polysaccharide-23 10/22/2007, 05/21/2009   Pneumococcal-Unspecified 08/27/2001   Tdap 05/21/2008, 12/11/2012   Zoster, Live 12/28/2010, 05/11/2015, 08/10/2019, 01/26/2020    Past Medical History:  Diagnosis Date   Allergy    Arthritis    Asthma    as a child   Cancer (HCC)    skin - neck  pre- cancer   COPD (chronic obstructive pulmonary disease) (HCC)    Depression    pt denies   Dyspnea    With exertion   GERD (gastroesophageal reflux disease)    tums if needed   Hyperlipidemia    Hypertension    Hypothyroidism    Inguinal hernia    Plantar fasciitis    Skin moles    Thyroid  disease    Umbilical hernia    repaired    Tobacco History: Social History   Tobacco Use  Smoking Status Former   Current packs/day: 0.00   Types: Cigarettes   Start date: 11/12/1955   Quit date: 11/12/1995   Years since quitting: 27.6  Smokeless Tobacco Never   Counseling given: Not Answered    Outpatient Medications Prior to Visit  Medication Sig Dispense Refill   albuterol  (PROVENTIL ) (2.5 MG/3ML) 0.083% nebulizer solution Use one half to one vial every 4 hours as needed (Patient taking differently: Take 2.5 mg by nebulization every 6 (six) hours as needed for shortness of breath.) 75 mL 12   albuterol  (VENTOLIN  HFA) 108 (90 Base) MCG/ACT inhaler 1- 2puffs every 4 hours if needed (Patient taking differently: Inhale 1 puff into the lungs every 6 (six) hours as needed for shortness of breath. 1- 2puffs every 4 hours if needed) 18 g 6   amLODipine  (NORVASC ) 5 MG tablet Take 5 mg by mouth daily.     azithromycin  (ZITHROMAX ) 500 MG tablet Take 1 tablet (500 mg total) by mouth daily for 5 days. 5 tablet 0   Budeson-Glycopyrrol-Formoterol  (BREZTRI  AEROSPHERE) 160-9-4.8 MCG/ACT AERO Inhale 2 puffs into the lungs in the morning and at bedtime. 5.9 g 11   feeding supplement (ENSURE PLUS HIGH  PROTEIN) LIQD Take 237 mLs by mouth 3 (three) times daily between meals. 10 mL 0   hydrochlorothiazide  (MICROZIDE ) 12.5 MG capsule Take 12.5 mg by mouth daily.     levofloxacin  (LEVAQUIN ) 750 MG tablet Take 1 tablet (750 mg total) by mouth daily for 5 days. 5 tablet 0   levothyroxine  (SYNTHROID , LEVOTHROID) 100 MCG tablet Take 100 mcg by mouth daily before breakfast.     menthol -cetylpyridinium (CEPACOL) 3 MG lozenge Take 1 lozenge by mouth daily as needed for sore throat.     Multiple Vitamin (MULTIVITAMIN) tablet Take 1 tablet by mouth daily.     Polyethyl Glycol-Propyl Glycol 0.4-0.3 % SOLN Place 1-2 drops into both eyes 3 (three) times daily as needed (dry eyes/irritated eyes).     predniSONE  (DELTASONE ) 20 MG tablet Take 2 tablets (40 mg total) by mouth daily with breakfast for 5 days. 10 tablet 0   simvastatin  (ZOCOR ) 40 MG tablet Take 40 mg by mouth every evening.      TUMS 500 MG chewable tablet Chew 500 mg by mouth 2 (two) times daily as needed for indigestion or heartburn.     No facility-administered medications prior to visit.     Review of Systems:   Constitutional: No weight  loss or gain, night sweats, fevers, chills. +fatigue (improving) HEENT: No headaches, difficulty swallowing, tooth/dental problems, or sore throat. No sneezing, itching, ear ache, nasal congestion  CV:  No chest pain, orthopnea, PND, swelling in lower extremities, anasarca, dizziness, palpitations, syncope Resp: +improved productive cough; minimal shortness of breah with exertion. No hemoptysis. No wheezing.  No chest wall deformity GI:  No heartburn, indigestion, abdominal pain, nausea, vomiting, diarrhea, change in bowel habits, loss of appetite, bloody stools.  GU: No dysuria, change in color of urine, urgency or frequency.  No flank pain, no hematuria  Skin: No rash, lesions, ulcerations MSK:  No joint pain or swelling.  No decreased range of motion.  No back pain. Neuro: No gait abnormalities or memory  impairment  Psych: No depression or anxiety. Mood stable.     Physical Exam:  BP 130/79 (BP Location: Left Arm, Patient Position: Sitting, Cuff Size: Normal)   Pulse 80   Ht 5' 9 (1.753 m)   Wt 167 lb 12.8 oz (76.1 kg)   SpO2 95%   BMI 24.78 kg/m   GEN: Pleasant, interactive, well-nourished; in no acute distress. HEENT:  Normocephalic and atraumatic. EACs patent bilaterally. TM pearly gray with present light reflex bilaterally. PERRLA. Sclera white. Nasal turbinates pink, moist and patent bilaterally. No rhinorrhea present. Oropharynx pink and moist, without exudate or edema. No lesions, ulcerations, or postnasal drip. White plaques to hard palate  NECK:  Supple w/ fair ROM. No JVD present. Normal carotid impulses w/o bruits. Thyroid  symmetrical with no goiter or nodules palpated. No lymphadenopathy.   CV: RRR, no m/r/g, no peripheral edema. Pulses intact, +2 bilaterally. No cyanosis, pallor or clubbing. PULMONARY:  Unlabored, regular breathing. Clear bilaterally A&P w/o wheezes/rales/rhonchi. No accessory muscle use. No dullness to percussion. GI: BS present and normoactive. Soft, non-tender to palpation. No organomegaly or masses detected. No CVA tenderness. MSK: No erythema, warmth or tenderness. Cap refil <2 sec all extrem. No deformities or joint swelling noted.  Neuro: A/Ox3. No focal deficits noted.   Skin: Warm, no lesions or rashe Psych: Normal affect and behavior. Judgement and thought content appropriate.     Lab Results:  CBC    Component Value Date/Time   WBC 10.0 07/11/2023 0100   RBC 4.16 (L) 07/11/2023 0100   HGB 12.4 (L) 07/11/2023 0100   HCT 37.1 (L) 07/11/2023 0100   PLT 342 07/11/2023 0100   MCV 89.2 07/11/2023 0100   MCH 29.8 07/11/2023 0100   MCHC 33.4 07/11/2023 0100   RDW 12.8 07/11/2023 0100   LYMPHSABS 1.5 07/11/2023 0100   MONOABS 1.2 (H) 07/11/2023 0100   EOSABS 0.2 07/11/2023 0100   BASOSABS 0.1 07/11/2023 0100    BMET    Component Value  Date/Time   NA 136 07/11/2023 0100   K 3.7 07/11/2023 0100   CL 105 07/11/2023 0100   CO2 24 07/11/2023 0100   GLUCOSE 121 (H) 07/11/2023 0100   BUN 24 (H) 07/11/2023 0100   CREATININE 1.34 (H) 07/11/2023 0100   CALCIUM  9.2 07/11/2023 0100   GFRNONAA 53 (L) 07/11/2023 0100   GFRAA >60 10/13/2018 1142    BNP    Component Value Date/Time   BNP 49.4 11/26/2021 2108     Imaging:  CT Angio Chest PE W and/or Wo Contrast Result Date: 07/10/2023 CLINICAL DATA:  Worsening shortness of breath EXAM: CT ANGIOGRAPHY CHEST WITH CONTRAST TECHNIQUE: Multidetector CT imaging of the chest was performed using the standard protocol during bolus administration of intravenous contrast.  Multiplanar CT image reconstructions and MIPs were obtained to evaluate the vascular anatomy. RADIATION DOSE REDUCTION: This exam was performed according to the departmental dose-optimization program which includes automated exposure control, adjustment of the mA and/or kV according to patient size and/or use of iterative reconstruction technique. CONTRAST:  75mL OMNIPAQUE  IOHEXOL  350 MG/ML SOLN COMPARISON:  08/21/2021 and chest radiograph 07/09/2021 FINDINGS: Cardiovascular: No filling defect is identified in the pulmonary arterial tree to suggest pulmonary embolus. Coronary, aortic arch, and branch vessel atherosclerotic vascular disease. Mediastinum/Nodes: Prevascular node 1.1 cm in short axis on image 28 series 302, formerly the same. Right hilar node 1.1 cm in short axis on image 66 series 302, formerly 1.0 cm. Left hilar node 1.2 cm in short axis on image 68 series 302, difficult to measure on prior exam due to lack of IV contrast but suspected 1.2 cm (stable). Small type 1 hiatal hernia. Lungs/Pleura: Emphysema. Airway thickening and mild airway plugging in both lower lobes with mild associated atelectasis. There is also some mucous plugging and atelectasis in the lingula. Peribronchovascular nodularity in the left lower lobe  is probably from infrahilar lymph nodes. Near occlusion of the right upper lobe bronchus due to nodularity and bronchial wall thickening on images 59-63 of series 303. Upper Abdomen: Bilateral fluid density renal cysts along the upper poles. Atheromatous plaque at the origins of the celiac trunk and SMA. There is some cephalad extension of small bowel loops anterior to the left hepatic lobe. Musculoskeletal: Thoracic spondylosis. Review of the MIP images confirms the above findings. IMPRESSION: 1. No filling defect is identified in the pulmonary arterial tree to suggest pulmonary embolus. 2. Airway thickening and mild airway plugging in both lower lobes with mild associated atelectasis. There is also some mucous plugging and atelectasis in the lingula. 3. Near occlusion of the right upper lobe bronchus due to nodularity and bronchial wall thickening. Bronchoscopy or short-term follow up CT scan is recommended to exclude underlying neoplasm. 4. Peribronchovascular nodularity in the left lower lobe is probably from infrahilar lymph nodes. 5. Mild mediastinal and hilar adenopathy, roughly similar to 08/21/2021, possibly reactive but technically nonspecific. 6. Small type 1 hiatal hernia. 7. Bilateral fluid density renal cysts along the upper poles. 8. Thoracic spondylosis. 9. Aortic Atherosclerosis (ICD10-I70.0) and Emphysema (ICD10-J43.9). Electronically Signed   By: Ryan Salvage M.D.   On: 07/10/2023 17:01   DG Chest 1 View Result Date: 07/10/2023 CLINICAL DATA:  Chest tightness. EXAM: CHEST  1 VIEW COMPARISON:  June 26, 2023 FINDINGS: The heart size and mediastinal contours are within normal limits. Mild linear scarring and/or atelectasis is seen within the bilateral lung bases. There is no evidence of focal consolidation, pleural effusion or pneumothorax. Multilevel degenerative changes seen throughout the thoracic spine. IMPRESSION: Mild bibasilar linear scarring and/or atelectasis. Electronically Signed    By: Suzen Dials M.D.   On: 07/10/2023 16:09   DG Chest 2 View Result Date: 06/26/2023 CLINICAL DATA:  Cough, shortness of breath EXAM: CHEST - 2 VIEW COMPARISON:  August 02, 2022 FINDINGS: The heart size and mediastinal contours are within normal limits. Both lungs are clear. The visualized skeletal structures are unremarkable. IMPRESSION: COPD with basilar hypoventilatory atelectasis. No acute cardiopulmonary infiltrates Electronically Signed   By: Franky Chard M.D.   On: 06/26/2023 08:43    Administration History     None          Latest Ref Rng & Units 01/24/2021    3:39 PM  PFT Results  FVC-Pre L  3.38   FVC-Predicted Pre % 90   FVC-Post L 3.72   FVC-Predicted Post % 99   Pre FEV1/FVC % % 46   Post FEV1/FCV % % 48   FEV1-Pre L 1.55   FEV1-Predicted Pre % 58   FEV1-Post L 1.79   DLCO uncorrected ml/min/mmHg 16.64   DLCO UNC% % 72   DLCO corrected ml/min/mmHg 16.64   DLCO COR %Predicted % 72   DLVA Predicted % 72   TLC L 9.00   TLC % Predicted % 135   RV % Predicted % 213     No results found for: NITRICOXIDE    Assessment & Plan:   CAP (community acquired pneumonia) Hospitalization for pneumonia. Discharged on azithromycin . Sputum culture grew out macrolide resistant strep parasanguinius. He has been changed to levofloxacin  with 4 days left. Clinically improving. Continue mucociliary clearance therapies. Strict return/ED precautions. Resolved hypoxia. Repeat CT chest in 6-8 weeks for resolution.  Patient Instructions  Continue Albuterol  inhaler 2 puffs or 3 mL neb every 6 hours as needed for shortness of breath or wheezing. Notify if symptoms persist despite rescue inhaler/neb use. Use nebs 2-3 times a day and then follow with flutter valve 10 times  Continue Breztri  2 puffs Twice daily. Brush tongue and rinse mouth afterwards Complete levofloxacin  as prescribed  Complete prednisone   Restart Mucinex  DM Twice daily for the next week then as needed for  cough/congestion  Nystatin rinses 4 times a day for 10 days, swish and swallow.   Repeat CT chest in 6-8 weeks  I am going to put orders in for vest therapy for you to use twice daily   Follow up in 8-10 weeks with Dr. Darlean or Katie Trachelle Low,NP. If symptoms do not improve or worsen, please contact office for sooner follow up or seek emergency care.    Bronchiectasis with acute exacerbation (HCC) See above. Clinically improved with resolving exacerbation. Will start him on vest therapy. Continue mucociliary clearance and bronchodilator therapies.   Patient has has daily productive cough lasting greater than 6  months requiring antibiotic therapy.  Flutter valve has been tried, but has not effectively mobilized secretions.  Considered and ruled out manual CPT as no consistent skilled caregiver available to perform therapy.  CT scan performed on 07/11/2023 confirms bronchiectasis.  Starting patient on vest therapy.    COPD  GOLD II See above. Continue current regimen. Action plan in place.   Thrush Thrush on exam. Add on nystatin rinses. Oral hygiene reviewed.   I spent 45 minutes of dedicated to the care of this patient on the date of this encounter to include pre-visit review of records, face-to-face time with the patient discussing conditions above, post visit ordering of testing, clinical documentation with the electronic health record, making appropriate referrals as documented, and communicating necessary findings to members of the patients care team.   Brandon LULLA Rouleau, NP 07/15/2023  Pt aware and understands NP's role.

## 2023-07-18 ENCOUNTER — Telehealth: Payer: Self-pay | Admitting: *Deleted

## 2023-07-18 NOTE — Telephone Encounter (Signed)
 Copied from CRM [id number]. Topic: Appointments - Appointment Scheduling >> Jul 14, 2023  9:18 AM Devaughn RAMAN wrote: Patient is calling to schedule an appointment. Per patient he stated Dr.Desai would like to see him in two weeks post hospital visit with trouble breathing and pulmonary issues. Contacted [facility] spoke with Lucyann and she stated to send CRM advising that Dr.Desai wanted to see patient post hospital visit within 2 weeks of being discharged. Informed patient, patient was thankful and verbalized understanding.  Patient seen on 07/15/23 by Izetta Rouleau NP.  Nothing further needed.

## 2023-07-28 ENCOUNTER — Other Ambulatory Visit: Payer: Self-pay | Admitting: Internal Medicine

## 2023-07-28 NOTE — Progress Notes (Signed)
 Gave Patient a telephone call on 07/28/23 at 1:15 pm informing him about the results of his positive AFB culture (drawn on 6/20) and the need for isolation.  I also informed Dr Darlean, his pulmonologist and his office will call him tomorrow to schedule an appointment. Patient understood the conversation.

## 2023-07-29 ENCOUNTER — Telehealth: Payer: Self-pay | Admitting: Internal Medicine

## 2023-07-29 NOTE — Telephone Encounter (Signed)
 See signed encounter below. PT calling again asking if he can be seen for possible TB. His Dr. Has also called. No appts avail w/Desai or Wert. Please call PT to advise.

## 2023-07-29 NOTE — Telephone Encounter (Signed)
 Ok with me

## 2023-07-29 NOTE — Telephone Encounter (Signed)
 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 Meade or Izetta Rouleau  He felt that Dr. Darlean was not the best fit for him   Dr Darlean, will you please advise what to do?  Thanks!

## 2023-07-29 NOTE — Telephone Encounter (Signed)
 Darlean Ozell NOVAK, MD to Me      07/29/23  4:40 PM We should know w/in a few days whether this is M TB or not, and we can't speed up this processs. Fine to refer to Meade as long as he knows she only goes to [facility] office    Dr. Meade, before I call the pt back will you please advise if you are okay with him switching his care to you?

## 2023-07-29 NOTE — Telephone Encounter (Signed)
 Pt following up on requests to see Dr Meade, as previous note explained. Pt said he got another call yesterday from a dr he saw in the hospital.  He said his signs mimic TB. That the dr al so said  he would try and get himan appt today w/a dr there

## 2023-07-30 NOTE — Telephone Encounter (Signed)
 I called and spoke with the pt and scheduled him 09/09/23 at 1:45 ok per ND Nothing further needed

## 2023-08-01 ENCOUNTER — Telehealth: Payer: Self-pay

## 2023-08-01 MED ORDER — CEFDINIR 300 MG PO CAPS: 300.0000 mg | ORAL_CAPSULE | Freq: Two times a day (BID) | ORAL | 0 refills | Status: AC

## 2023-08-01 NOTE — Telephone Encounter (Signed)
 Called and spoke with the patient regarding MW note. Pt verbalized understanding and had no other concerns. Nfn

## 2023-08-01 NOTE — Telephone Encounter (Signed)
 Copied from CRM [id number]. Topic: Clinical - Lab/Test Results >> Aug 01, 2023  8:50 AM Brandon Sanford wrote: Reason for CRM: Patient states based off recent test results, he needs a antibiotic and would like this to go ahead and be sent but would like to know what kind. Please reach out to patient.  Patient seen primary Wednesday and was told TB was negative. Patient states he was told something came back positive in sputum culture from primary Dr  and that he suggested patients pulmonologist prescribe medication to treat.  Dr. Darlean can you please advise.

## 2023-08-13 LAB — ACID FAST ID BY PCR AND SUSCEPTIBILITIES
M Tuberculosis Complex: NEGATIVE
M avium complex: POSITIVE — AB

## 2023-08-13 LAB — MAC SUSCEPTIBILITY BROTH
Ciprofloxacin: >8
Clarithromycin: 2
Doxycycline: >8
Linezolid: 32
Minocycline: >8
Moxifloxacin: 4
Rifabutin: 2
Rifampin: >4
Streptomycin: >32

## 2023-08-13 LAB — ACID FAST CULTURE WITH REFLEXED SENSITIVITIES (MYCOBACTERIA): Acid Fast Culture: POSITIVE — AB

## 2023-08-16 ENCOUNTER — Emergency Department (HOSPITAL_BASED_OUTPATIENT_CLINIC_OR_DEPARTMENT_OTHER)
Admission: EM | Admit: 2023-08-16 | Discharge: 2023-08-17 | Disposition: A | Discharge status: Home / Self Care | Attending: Emergency Medicine | Admitting: Emergency Medicine

## 2023-08-16 ENCOUNTER — Other Ambulatory Visit: Payer: Self-pay

## 2023-08-16 ENCOUNTER — Encounter (HOSPITAL_BASED_OUTPATIENT_CLINIC_OR_DEPARTMENT_OTHER): Payer: Self-pay | Admitting: Emergency Medicine

## 2023-08-16 DIAGNOSIS — R519 Headache, unspecified: Secondary | ICD-10-CM | POA: Insufficient documentation

## 2023-08-16 DIAGNOSIS — I10 Essential (primary) hypertension: Secondary | ICD-10-CM | POA: Diagnosis present

## 2023-08-16 DIAGNOSIS — Z79899 Other long term (current) drug therapy: Secondary | ICD-10-CM | POA: Insufficient documentation

## 2023-08-16 HISTORY — DX: Pneumonia, unspecified organism: J18.9

## 2023-08-16 LAB — BASIC METABOLIC PANEL WITH GFR
Anion gap: 10 (ref 5–15)
BUN: 20 mg/dL (ref 8–23)
CO2: 24 mmol/L (ref 22–32)
Calcium: 9.7 mg/dL (ref 8.9–10.3)
Chloride: 107 mmol/L (ref 98–111)
Creatinine, Ser: 1.3 mg/dL — ABNORMAL HIGH (ref 0.61–1.24)
GFR, Estimated: 55 mL/min — ABNORMAL LOW (ref >60)
Glucose, Bld: 120 mg/dL — ABNORMAL HIGH (ref 70–99)
Potassium: 3.8 mmol/L (ref 3.5–5.1)
Sodium: 140 mmol/L (ref 135–145)

## 2023-08-16 LAB — CBC WITH DIFFERENTIAL/PLATELET
Abs Immature Granulocytes: 0.03 K/uL (ref 0.00–0.07)
Basophils Absolute: 0 K/uL (ref 0.0–0.1)
Basophils Relative: 1 %
Eosinophils Absolute: 0.2 K/uL (ref 0.0–0.5)
Eosinophils Relative: 3 %
HCT: 36.9 % — ABNORMAL LOW (ref 39.0–52.0)
Hemoglobin: 12.4 g/dL — ABNORMAL LOW (ref 13.0–17.0)
Immature Granulocytes: 1 %
Lymphocytes Relative: 29 %
Lymphs Abs: 1.8 K/uL (ref 0.7–4.0)
MCH: 29.1 pg (ref 26.0–34.0)
MCHC: 33.6 g/dL (ref 30.0–36.0)
MCV: 86.6 fL (ref 80.0–100.0)
Monocytes Absolute: 0.6 K/uL (ref 0.1–1.0)
Monocytes Relative: 10 %
Neutro Abs: 3.5 K/uL (ref 1.7–7.7)
Neutrophils Relative %: 56 %
Platelets: 285 K/uL (ref 150–400)
RBC: 4.26 MIL/uL (ref 4.22–5.81)
RDW: 13.2 % (ref 11.5–15.5)
WBC: 6.1 K/uL (ref 4.0–10.5)
nRBC: 0 % (ref 0.0–0.2)

## 2023-08-16 LAB — TROPONIN T, HIGH SENSITIVITY: Troponin T High Sensitivity: <15 ng/L (ref <19)

## 2023-08-16 NOTE — ED Triage Notes (Signed)
 Pt reports BP at home was 160s/70s, and 160s/90s; checked it because he had a flushed feeling; sts he feels ok now; recent admission in June for PNA

## 2023-08-17 ENCOUNTER — Emergency Department (HOSPITAL_BASED_OUTPATIENT_CLINIC_OR_DEPARTMENT_OTHER)

## 2023-08-17 ENCOUNTER — Other Ambulatory Visit (HOSPITAL_BASED_OUTPATIENT_CLINIC_OR_DEPARTMENT_OTHER)

## 2023-08-17 NOTE — ED Provider Notes (Signed)
 [place] [facility] Provider Note   CSN: [id number] Arrival date & time: 08/16/23  2216     Patient presents with: Hypertension   Brandon Sanford is a 83 y.o. male.   83 year old male presents ER today secondary to hypertension.  Patient has a history of hypertension.  Started Flomax recently his doctor told him to hold off on his amlodipine  while starting the Flomax for blood pressure concerns.  Patient states he felt little flushed tonight and he supposed be keeping track of his blood pressures anyway so he checked his blood pressure and found that it was elevated in the 160s systolic so he presents here for further evaluation.  On review of systems patient relates that he did have a little bit of shortness of breath earlier in the day and had a unprovoked headache a few days ago that resolved without any intervention.  No neurologic changes, edema, chest pain, back pain, abdominal pain or other associated symptoms.   Hypertension       Prior to Admission medications   Medication Sig Start Date End Date Taking? Authorizing Provider  albuterol  (PROVENTIL ) (2.5 MG/3ML) 0.083% nebulizer solution Use one half to one vial every 4 hours as needed Patient taking differently: Take 2.5 mg by nebulization every 6 (six) hours as needed for shortness of breath. 06/30/23   Darlean Ozell NOVAK, MD  albuterol  (VENTOLIN  HFA) 108 (90 Base) MCG/ACT inhaler 1- 2puffs every 4 hours if needed Patient taking differently: Inhale 1 puff into the lungs every 6 (six) hours as needed for shortness of breath. 1- 2puffs every 4 hours if needed 06/30/23   Darlean Ozell NOVAK, MD  amLODipine  (NORVASC ) 5 MG tablet Take 5 mg by mouth daily.    [provider]  Budeson-Glycopyrrol-Formoterol  (BREZTRI  AEROSPHERE) 160-9-4.8 MCG/ACT AERO Inhale 2 puffs into the lungs in the morning and at bedtime. 12/20/21   Darlean Ozell NOVAK, MD  cefdinir  (OMNICEF ) 300 MG capsule Take 1 capsule (300 mg  total) by mouth 2 (two) times daily. 08/01/23   Darlean Ozell NOVAK, MD  feeding supplement (ENSURE PLUS HIGH PROTEIN) LIQD Take 237 mLs by mouth 3 (three) times daily between meals. 07/13/23   Rashid, Farhan, MD  hydrochlorothiazide  (MICROZIDE ) 12.5 MG capsule Take 12.5 mg by mouth daily.    [provider]  levothyroxine  (SYNTHROID , LEVOTHROID) 100 MCG tablet Take 100 mcg by mouth daily before breakfast.    [provider]  menthol -cetylpyridinium (CEPACOL) 3 MG lozenge Take 1 lozenge by mouth daily as needed for sore throat.    [provider]  Multiple Vitamin (MULTIVITAMIN) tablet Take 1 tablet by mouth daily.    [provider]  Polyethyl Glycol-Propyl Glycol 0.4-0.3 % SOLN Place 1-2 drops into both eyes 3 (three) times daily as needed (dry eyes/irritated eyes).    [provider]  simvastatin  (ZOCOR ) 40 MG tablet Take 40 mg by mouth every evening.     [provider]  TUMS 500 MG chewable tablet Chew 500 mg by mouth 2 (two) times daily as needed for indigestion or heartburn. 04/19/17   [provider]    Allergies: Lisinopril, Chlorhexidine , and Pneumococcal vaccines    Review of Systems  Updated Vital Signs BP (!) 154/83   Pulse (!) 58   Temp 97.9 F (36.6 C) (Oral)   Resp 12   Ht 5' 9 (1.753 m)   Wt 74.8 kg   SpO2 98%   BMI 24.37 kg/m   Physical  Exam Vitals and nursing note reviewed.  Constitutional:      Appearance: He is well-developed.  HENT:     Head: Normocephalic and atraumatic.  Cardiovascular:     Rate and Rhythm: Normal rate.  Pulmonary:     Effort: Pulmonary effort is normal. No respiratory distress.  Abdominal:     General: There is no distension.  Musculoskeletal:        General: Normal range of motion.     Cervical back: Normal range of motion.  Neurological:     Mental Status: He is alert.     (all labs ordered are listed, but only abnormal results are displayed) Labs Reviewed  CBC WITH  DIFFERENTIAL/PLATELET - Abnormal; Notable for the following components:      Result Value   Hemoglobin 12.4 (*)    HCT 36.9 (*)    All other components within normal limits  BASIC METABOLIC PANEL WITH GFR - Abnormal; Notable for the following components:   Glucose, Bld 120 (*)    Creatinine, Ser 1.30 (*)    GFR, Estimated 55 (*)    All other components within normal limits  TROPONIN T, HIGH SENSITIVITY    EKG: EKG Interpretation Date/Time:  Saturday August 16 2023 23:45:58 EDT Ventricular Rate:  55 PR Interval:  166 QRS Duration:  90 QT Interval:  431 QTC Calculation: 413 R Axis:   81  Text Interpretation: Sinus rhythm Borderline right axis deviation Confirmed by Lorette Mayo [phone]) on 08/17/2023 12:31:16 AM  Radiology: CT Head Wo Contrast Result Date: 08/17/2023 CLINICAL DATA:  Headache, neuro deficit EXAM: CT HEAD WITHOUT CONTRAST TECHNIQUE: Contiguous axial images were obtained from the base of the skull through the vertex without intravenous contrast. RADIATION DOSE REDUCTION: This exam was performed according to the departmental dose-optimization program which includes automated exposure control, adjustment of the mA and/or kV according to patient size and/or use of iterative reconstruction technique. COMPARISON:  None Available. FINDINGS: Brain: No evidence of acute infarction, hemorrhage, hydrocephalus, extra-axial collection or mass lesion/mass effect. Vascular: No hyperdense vessel. Skull: No acute fracture. Sinuses/Orbits: Clear sinuses.  No acute orbital findings. Other: No mastoid effusions. IMPRESSION: No evidence of acute intracranial abnormality. Electronically Signed   By: Gilmore GORMAN Molt M.D.   On: 08/17/2023 00:28   DG Chest 2 View Result Date: 08/17/2023 CLINICAL DATA:  Shortness of breath EXAM: CHEST - 2 VIEW COMPARISON:  07/10/2023 FINDINGS: Cardiac shadow is stable. Mild aortic calcifications are seen. The lungs are well aerated bilaterally. Stable scarring in the  left base is seen. No focal infiltrate or effusion is noted. No bony abnormality is seen. IMPRESSION: No acute abnormality noted. Electronically Signed   By: Oneil Devonshire M.D.   On: 08/17/2023 00:27     Procedures   Medications Ordered in the ED - No data to display                                  Medical Decision Making Amount and/or Complexity of Data Reviewed Labs: ordered. Radiology: ordered. ECG/medicine tests: ordered.   Secondary to his flushing, shortness of breath earlier, headache a couple days ago with high blood pressure CT head and appropriate labs were ordered.  Patient is no evidence of heart failure, ACS, stroke, brain bleed or other emergent causes.  Suspect this is because he stopped his amlodipine .  He will start back at a half dose for a few days and if  feeling okay and blood pressures are stable we will increase it and if primary doctor for further management of blood pressure medication     Final diagnoses:  Hypertension, unspecified type    ED Discharge Orders     None          Bharath Bernstein, Selinda, MD 08/17/23 [phone]

## 2023-08-25 ENCOUNTER — Telehealth: Payer: Self-pay | Admitting: Nurse Practitioner

## 2023-08-25 NOTE — Telephone Encounter (Signed)
 Dr. Meade, seeing you 8/19. Just FYI. Thanks.

## 2023-08-25 NOTE — Telephone Encounter (Signed)
 Mitch called 8/1 and stated that pt went to go get afflovest and and was under the impression that his insurance would cover 100% of the cost and only cover 80%. They offered financial help but pt refused and said to cancel order. Sending to provider in telephone encounter to inform. Please advise.

## 2023-09-01 ENCOUNTER — Ambulatory Visit (HOSPITAL_BASED_OUTPATIENT_CLINIC_OR_DEPARTMENT_OTHER)
Admission: RE | Admit: 2023-09-01 | Discharge: 2023-09-01 | Disposition: A | Discharge status: Home / Self Care | Source: Ambulatory Visit | Attending: Nurse Practitioner | Admitting: Nurse Practitioner

## 2023-09-01 DIAGNOSIS — J189 Pneumonia, unspecified organism: Secondary | ICD-10-CM | POA: Insufficient documentation

## 2023-09-02 ENCOUNTER — Other Ambulatory Visit

## 2023-09-09 ENCOUNTER — Ambulatory Visit: Admitting: Internal Medicine

## 2023-09-09 ENCOUNTER — Encounter: Payer: Self-pay | Admitting: Internal Medicine

## 2023-09-09 VITALS — BP 114/86 | HR 73 | Temp 97.9°F | Ht 69.0 in | Wt 171.0 lb

## 2023-09-09 DIAGNOSIS — R9389 Abnormal findings on diagnostic imaging of other specified body structures: Secondary | ICD-10-CM

## 2023-09-09 DIAGNOSIS — J439 Emphysema, unspecified: Secondary | ICD-10-CM

## 2023-09-09 DIAGNOSIS — J4489 Other specified chronic obstructive pulmonary disease: Secondary | ICD-10-CM | POA: Diagnosis not present

## 2023-09-09 DIAGNOSIS — J479 Bronchiectasis, uncomplicated: Secondary | ICD-10-CM | POA: Diagnosis not present

## 2023-09-09 MED ORDER — GUAIFENESIN ER 600 MG PO TB12: 600.0000 mg | ORAL_TABLET | Freq: Two times a day (BID) | ORAL | 11 refills | Status: AC | PRN

## 2023-09-09 NOTE — Progress Notes (Signed)
 Brandon Sanford    [id number]    March 24, 1940  Primary Care Physician:Hammer, Cheryle, MD Date of Appointment: 09/09/2023 Established Patient Visit  Chief complaint:   Chief Complaint  Patient presents with   COPD    Breathing is better.     HPI: Brandon Sanford is a 83 y.o. man with COPD Gold stage II and bronchiectasis. History of tobacco use disorder quit in 1997.  Interval Updates: Hospitalized for pneumonia and copd exacerbation June 2025. Sputum culture grew strep parasanguinus and he was treated initially with ceftriaxone /azithromycin  and then completed levofloxacin . Feeling better using breztri  2 puffs bid, with flutter valve. Minimal prn albuterol  use.   Played 9 holes of golf. Able to keep up with ADLs.  Still brings up mucus daily with flutter valve.  No fevers, chills, hemoptysis Completed abx No thrush.   Had follow up ct scan to ensure resolution of RUL occlusion concerning for mucus plug.   I have reviewed the patient's family social and past medical history and updated as appropriate.   Past Medical History:  Diagnosis Date   Allergy    Arthritis    Asthma    as a child   Cancer (HCC)    skin - neck  pre- cancer   COPD (chronic obstructive pulmonary disease) (HCC)    Depression    pt denies   Dyspnea    With exertion   GERD (gastroesophageal reflux disease)    tums if needed   Hyperlipidemia    Hypertension    Hypothyroidism    Inguinal hernia    Plantar fasciitis    Pneumonia    Skin moles    Thyroid  disease    Umbilical hernia    repaired    Past Surgical History:  Procedure Laterality Date   COLONOSCOPY     COLONOSCOPY W/ POLYPECTOMY     HERNIA REPAIR  11/26/10   umbilical hernia and LIH repair    INGUINAL HERNIA REPAIR  11/26/2010   Procedure: HERNIA REPAIR INGUINAL ADULT;  Surgeon: Dann FORBES Hummer, MD;  Location: [facility];  Service: General;  Laterality: Left;  left inguinal hernia repair with mesh   INGUINAL  HERNIA REPAIR Right 02/04/2018   Procedure: REPAIR OF RIGHT INGUINAL HERNIA WITH MESH;  Surgeon: Hummer Dann, MD;  Location: [facility] OR;  Service: General;  Laterality: Right;   INGUINAL HERNIA REPAIR Right 10/16/2018   Procedure: LAPAROSCOPIC RIGHT INGUINAL HERNIA;  Surgeon: Rubin Calamity, MD;  Location: [facility] OR;  Service: General;  Laterality: Right;   INSERTION OF MESH Right 02/04/2018   Procedure: INSERTION OF MESH;  Surgeon: Hummer Dann, MD;  Location: [facility] OR;  Service: General;  Laterality: Right;   INSERTION OF MESH N/A 10/16/2018   Procedure: Insertion Of Mesh;  Surgeon: Rubin Calamity, MD;  Location: [facility] OR;  Service: General;  Laterality: N/A;   TONSILLECTOMY     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   Procedure: LEFT TOTAL HIP ARTHROPLASTY ANTERIOR APPROACH;  Surgeon: Vernetta Lonni GRADE, MD;  Location: [facility] OR;  Service: Orthopedics;  Laterality: Left;   UMBILICAL HERNIA REPAIR  11/26/2010   Procedure: HERNIA REPAIR UMBILICAL ADULT;  Surgeon: Dann FORBES Hummer, MD;  Location: [facility];  Service: General;  Laterality: N/A;  umbilical hernia repair with mesh    Family History  Problem Relation Age of Onset   Colon cancer Neg Hx    Colon polyps Neg Hx    Esophageal  cancer Neg Hx    Stomach cancer Neg Hx    Rectal cancer Neg Hx     Social History   Occupational History   Not on file  Tobacco Use   Smoking status: Former    Current packs/day: 0.00    Types: Cigarettes    Start date: 11/12/1955    Quit date: 11/12/1995    Years since quitting: 27.8   Smokeless tobacco: Never  Vaping Use   Vaping status: Never Used  Substance and Sexual Activity   Alcohol use: Yes    Alcohol/week: 2.0 standard drinks of alcohol    Types: 2 Cans of beer per week   Drug use: No   Sexual activity: Not on file     Physical Exam: Blood pressure 114/86, pulse 73, temperature 97.9 F (36.6 C), temperature source Oral, height 5' 9 (1.753 m), weight 171 lb (77.6 kg),  SpO2 94%.  Gen:      No acute distress ENT:  no nasal polyps, mucus membranes moist Lungs:    Diminished, No increased respiratory effort, symmetric chest wall excursion, clear to auscultation bilaterally, no wheezes or crackles CV:         Regular rate and rhythm; no murmurs, rubs, or gallops.  No pedal edema   Data Reviewed: Imaging: I have personally reviewed the CT Chest from June 2025 which shows   PFTs:     Latest Ref Rng & Units 01/24/2021    3:39 PM  PFT Results  FVC-Pre L 3.38   FVC-Predicted Pre % 90   FVC-Post L 3.72   FVC-Predicted Post % 99   Pre FEV1/FVC % % 46   Post FEV1/FCV % % 48   FEV1-Pre L 1.55   FEV1-Predicted Pre % 58   FEV1-Post L 1.79   DLCO uncorrected ml/min/mmHg 16.64   DLCO UNC% % 72   DLCO corrected ml/min/mmHg 16.64   DLCO COR %Predicted % 72   DLVA Predicted % 72   TLC L 9.00   TLC % Predicted % 135   RV % Predicted % 213    I have personally reviewed the patient's PFTs and moderately severe COPD FEV1 58% of predicted  Labs:  Immunization status: Immunization History  Administered Date(s) Administered   Fluad Quad(high Dose 65+) 10/18/2020   Influenza, High Dose Seasonal PF 09/26/2016, 10/02/2018   Influenza-Unspecified 11/21/2000, 11/22/2003, 09/21/2004, 12/21/2004, 11/21/2005, 10/12/2008, 10/21/2009, 12/22/2010, 10/22/2011, 11/21/2012, 10/25/2013, 09/29/2015, 10/21/2016, 10/08/2017, 09/28/2020   Moderna Sars-Covid-2 Vaccination 01/28/2019, 02/25/2019, 05/22/2019, 06/20/2019, 10/22/2019, 11/22/2019   Pneumococcal Polysaccharide-23 10/22/2007, 05/21/2009   Pneumococcal-Unspecified 08/27/2001   Tdap 05/21/2008, 12/11/2012   Zoster, Live 12/28/2010, 05/11/2015, 08/10/2019, 01/26/2020    External Records Personally Reviewed: hospital stay, pulmonary  Assessment:  Severe COPD FEV1 58% of predicted Bronchiectasis History of tobacco use disorder RUL mucus plug, abnormal CT Chest  Plan/Recommendations:   I am glad to hear your  breathing is doing well. Continue the Breztri  2 puffs twice a day, gargle after use Continue albuterol  inhaler or nebulizer treatment as needed Continue the flutter valve to help bring up mucus.  The best time to do this is after a breathing treatment or inhaler treatment I recommend using Mucinex  (without DM) 1 pill twice a day as needed to help loosen up phlegm  I am going to refer you to pulmonary rehab which is a structured exercise program for patients with chronic lung disease.  I made the referral and they will contact you to set this up.  I reviewed  your CT scan with you today.  I will notify you once the final report is read in case we need to make any changes.  I suspect that the mucus in your right upper lobe is improving.  Continue to stay active.  This is the best for your lung health.   Return to Care: Return in about 6 months (around 03/11/2024).   Verdon Gore, MD Pulmonary and Critical Care Medicine [facility] [phone]

## 2023-09-09 NOTE — Patient Instructions (Addendum)
 It was a pleasure to see you today!  Please schedule follow up with myself in 6  months.  If my schedule is not open yet, we will contact you with a reminder closer to that time. Please call [phone] if you haven't heard from us  a month before, and always call us  sooner if issues or concerns arise. You can also send us  a message through MyChart, but but aware that this is not to be used for urgent issues and it may take up to 5-7 days to receive a reply. Please be aware that you will likely be able to view your results before I have a chance to respond to them. Please give us  5 business days to respond to any non-urgent results.    I am glad to hear your breathing is doing well. Continue the Breztri  2 puffs twice a day, gargle after use Continue albuterol  inhaler or nebulizer treatment as needed Continue the flutter valve to help bring up mucus.  The best time to do this is after a breathing treatment or inhaler treatment I recommend using Mucinex  (without DM) 1 pill twice a day as needed to help loosen up phlegm  I am going to refer you to pulmonary rehab which is a structured exercise program for patients with chronic lung disease.  I made the referral and they will contact you to set this up.  I reviewed your CT scan with you today.  I will notify you once the final report is read in case we need to make any changes.  I suspect that the mucus in your right upper lobe is improving.  Continue to stay active.  This is the best for your lung health.

## 2023-09-11 ENCOUNTER — Telehealth (HOSPITAL_COMMUNITY): Payer: Self-pay

## 2023-09-11 NOTE — Telephone Encounter (Signed)
 Pt insurance is active and benefits verified through [facility]. Co-pay $30, DED $0/$0 met, out of pocket $6,750/$1,516.26 met, co-insurance 0%. No pre-authorization required. 09/11/2023 @ 12:55pm, spoke with Devere, REF# [id number].

## 2023-09-11 NOTE — Telephone Encounter (Signed)
 Called patient to go over pulmonary rehab program, patient is potentially interested but needs some time to think about it. He will call us  when he decides he's ready to schedule.

## 2023-09-15 ENCOUNTER — Telehealth (HOSPITAL_COMMUNITY): Payer: Self-pay

## 2023-09-15 ENCOUNTER — Ambulatory Visit: Payer: Self-pay | Admitting: Nurse Practitioner

## 2023-09-15 NOTE — Telephone Encounter (Signed)
 Patient called back to get scheduled in the Pulmonary Rehab Program. Patient will come in for orientation on 9/15 and will attend the 1:15 exercise class.  Pensions consultant.

## 2023-09-15 NOTE — Progress Notes (Signed)
 CT chest improved. Stable chronic changes of COPD/emphysema, mild scarring, plaque buildup in arteries. Follow up as scheduled. Thanks.

## 2023-10-03 ENCOUNTER — Telehealth (HOSPITAL_COMMUNITY): Payer: Self-pay

## 2023-10-03 NOTE — Telephone Encounter (Signed)
 Called to confirm appt. Pt confirmed appt. Instructed pt on proper footwear. Gave directions along with department number.

## 2023-10-06 ENCOUNTER — Encounter (HOSPITAL_COMMUNITY)
Admission: RE | Admit: 2023-10-06 | Discharge: 2023-10-06 | Disposition: A | Discharge status: Home / Self Care | Source: Ambulatory Visit | Attending: Internal Medicine | Admitting: Internal Medicine

## 2023-10-06 ENCOUNTER — Encounter (HOSPITAL_COMMUNITY): Payer: Self-pay

## 2023-10-06 VITALS — BP 108/56 | HR 67 | Ht 69.0 in | Wt 172.2 lb

## 2023-10-06 DIAGNOSIS — J449 Chronic obstructive pulmonary disease, unspecified: Secondary | ICD-10-CM | POA: Insufficient documentation

## 2023-10-06 NOTE — Progress Notes (Signed)
 Pulmonary Individual Treatment Plan  Patient Details  Name: Brandon Sanford MRN: [id number] Date of Birth: 03-Mar-1940 Referring Provider:   Conrad Ports Pulmonary Rehab Walk Test from 10/06/2023 in [facility]  Referring Provider Meade    Initial Encounter Date:  Flowsheet Row Pulmonary Rehab Walk Test from 10/06/2023 in [facility]  Date 10/06/23    Visit Diagnosis: Stage 2 moderate COPD by GOLD classification (HCC)  Patient's Home Medications on Admission:   Current Outpatient Medications:    albuterol  (PROVENTIL ) (2.5 MG/3ML) 0.083% nebulizer solution, Use one half to one vial every 4 hours as needed, Disp: 75 mL, Rfl: 12   albuterol  (VENTOLIN  HFA) 108 (90 Base) MCG/ACT inhaler, 1- 2puffs every 4 hours if needed, Disp: 18 g, Rfl: 6   amLODipine  (NORVASC ) 5 MG tablet, Take 5 mg by mouth daily., Disp: , Rfl:    Budeson-Glycopyrrol-Formoterol  (BREZTRI  AEROSPHERE) 160-9-4.8 MCG/ACT AERO, Inhale 2 puffs into the lungs in the morning and at bedtime., Disp: 5.9 g, Rfl: 11   feeding supplement (ENSURE PLUS HIGH PROTEIN) LIQD, Take 237 mLs by mouth 3 (three) times daily between meals., Disp: 10 mL, Rfl: 0   guaiFENesin  (MUCINEX ) 600 MG 12 hr tablet, Take 1 tablet (600 mg total) by mouth 2 (two) times daily as needed for to loosen phlegm., Disp: 180 each, Rfl: 11   hydrochlorothiazide  (MICROZIDE ) 12.5 MG capsule, Take 12.5 mg by mouth daily., Disp: , Rfl:    levothyroxine  (SYNTHROID , LEVOTHROID) 100 MCG tablet, Take 100 mcg by mouth daily before breakfast., Disp: , Rfl:    menthol -cetylpyridinium (CEPACOL) 3 MG lozenge, Take 1 lozenge by mouth daily as needed for sore throat., Disp: , Rfl:    Multiple Vitamin (MULTIVITAMIN) tablet, Take 1 tablet by mouth daily., Disp: , Rfl:    Polyethyl Glycol-Propyl Glycol 0.4-0.3 % SOLN, Place 1-2 drops into both eyes 3 (three) times daily as  needed (dry eyes/irritated eyes)., Disp: , Rfl:    simvastatin  (ZOCOR ) 40 MG tablet, Take 40 mg by mouth every evening. , Disp: , Rfl:    TUMS 500 MG chewable tablet, Chew 500 mg by mouth 2 (two) times daily as needed for indigestion or heartburn., Disp: , Rfl:    cefdinir  (OMNICEF ) 300 MG capsule, Take 1 capsule (300 mg total) by mouth 2 (two) times daily., Disp: 20 capsule, Rfl: 0  Past Medical History: Past Medical History:  Diagnosis Date   Allergy    Arthritis    Asthma    as a child   Cancer (HCC)    skin - neck  pre- cancer   COPD (chronic obstructive pulmonary disease) (HCC)    Depression    pt denies   Dyspnea    With exertion   GERD (gastroesophageal reflux disease)    tums if needed   Hyperlipidemia    Hypertension    Hypothyroidism    Inguinal hernia    Plantar fasciitis    Pneumonia    Skin moles    Thyroid  disease    Umbilical hernia    repaired    Tobacco Use: Social History   Tobacco Use  Smoking Status Former   Current packs/day: 0.00   Average packs/day: 1.5 packs/day for 40.0 years (60.0 ttl pk-yrs)   Types: Cigarettes   Start date: 11/12/1955   Quit date: 11/12/1995   Years since quitting: 27.9  Smokeless Tobacco Never    Labs:  Review Flowsheet       Latest Ref Rng & Units 09/18/2006 05/07/2017  Labs for ITP Cardiac and Pulmonary Rehab  Bicarbonate - 20.9  -  TCO2 22 - 32 mmol/L 22  24   Acid-base deficit - 4.0  -    Capillary Blood Glucose: No results found for: GLUCAP   Pulmonary Assessment Scores:  Pulmonary Assessment Scores     Row Name 10/06/23 0918         ADL UCSD   ADL Phase Entry     SOB Score total 18       CAT Score   CAT Score 16       mMRC Score   mMRC Score 1       UCSD: Self-administered rating of dyspnea associated with activities of daily living (ADLs) 6-point scale (0 = not at all to 5 = maximal or unable to do because of breathlessness)  Scoring Scores range from 0 to 120.  Minimally  important difference is 5 units  CAT: CAT can identify the health impairment of COPD patients and is better correlated with disease progression.  CAT has a scoring range of zero to 40. The CAT score is classified into four groups of low (less than 10), medium (10 - 20), high (21-30) and very high (31-40) based on the impact level of disease on health status. A CAT score over 10 suggests significant symptoms.  A worsening CAT score could be explained by an exacerbation, poor medication adherence, poor inhaler technique, or progression of COPD or comorbid conditions.  CAT MCID is 2 points  mMRC: mMRC (Modified Medical Research Council) Dyspnea Scale is used to assess the degree of baseline functional disability in patients of respiratory disease due to dyspnea. No minimal important difference is established. A decrease in score of 1 point or greater is considered a positive change.   Pulmonary Function Assessment:  Pulmonary Function Assessment - 10/06/23 0918       Breath   Bilateral Breath Sounds Decreased    Shortness of Breath Yes;Limiting activity;Fear of Shortness of Breath          Exercise Target Goals: Exercise Program Goal: Individual exercise prescription set using results from initial 6 min walk test and THRR while considering  patient's activity barriers and safety.   Exercise Prescription Goal: Initial exercise prescription builds to 30-45 minutes a day of aerobic activity, 2-3 days per week.  Home exercise guidelines will be given to patient during program as part of exercise prescription that the participant will acknowledge.  Activity Barriers & Risk Stratification:  Activity Barriers & Cardiac Risk Stratification - 10/06/23 0919       Activity Barriers & Cardiac Risk Stratification   Activity Barriers Deconditioning;Muscular Weakness;Shortness of Breath;Left Hip Replacement;Arthritis          6 Minute Walk:  6 Minute Walk     Row Name 10/06/23 1025          6 Minute Walk   Phase Initial     Distance 1050 feet     Walk Time 6 minutes     # of Rest Breaks 0     MPH 1.99     METS 1.71     RPE 11     Perceived Dyspnea  1     VO2 Peak 5.99     Symptoms No     Resting HR 67 bpm     Resting BP 108/56     Resting Oxygen Saturation  95 %     Exercise Oxygen Saturation  during 6 min walk 92 %     Max Ex. HR 80 bpm     Max Ex. BP 120/60     2 Minute Post BP 118/60       Interval HR   1 Minute HR 72     2 Minute HR 77     3 Minute HR 80     4 Minute HR 79     5 Minute HR 78     6 Minute HR 80     2 Minute Post HR 63     Interval Heart Rate? Yes       Interval Oxygen   Interval Oxygen? Yes     Baseline Oxygen Saturation % 95 %     1 Minute Oxygen Saturation % 96 %     1 Minute Liters of Oxygen 0 L     2 Minute Oxygen Saturation % 91 %     2 Minute Liters of Oxygen 0 L     3 Minute Oxygen Saturation % 92 %     3 Minute Liters of Oxygen 0 L     4 Minute Oxygen Saturation % 96 %     4 Minute Liters of Oxygen 0 L     5 Minute Oxygen Saturation % 96 %     5 Minute Liters of Oxygen 0 L     6 Minute Oxygen Saturation % 95 %     6 Minute Liters of Oxygen 0 L     2 Minute Post Oxygen Saturation % 97 %     2 Minute Post Liters of Oxygen 0 L        Oxygen Initial Assessment:  Oxygen Initial Assessment - 10/06/23 0918       Home Oxygen   Home Oxygen Device None    Sleep Oxygen Prescription None    Home Exercise Oxygen Prescription None    Home Resting Oxygen Prescription None      Initial 6 min Walk   Oxygen Used None      Program Oxygen Prescription   Program Oxygen Prescription None      Intervention   Short Term Goals To learn and understand importance of maintaining oxygen saturations>88%;To learn and demonstrate proper use of respiratory medications;To learn and understand importance of monitoring SPO2 with pulse oximeter and demonstrate accurate use of the pulse oximeter.;To learn and demonstrate proper pursed lip  breathing techniques or other breathing techniques.     Long  Term Goals Verbalizes importance of monitoring SPO2 with pulse oximeter and return demonstration;Maintenance of O2 saturations>88%;Exhibits proper breathing techniques, such as pursed lip breathing or other method taught during program session;Compliance with respiratory medication;Demonstrates proper use of MDI's          Oxygen Re-Evaluation:   Oxygen Discharge (Final Oxygen Re-Evaluation):   Initial Exercise Prescription:  Initial Exercise Prescription - 10/06/23 1000       Date of Initial Exercise RX and Referring Provider   Date 10/06/23    Referring Provider Meade    Expected Discharge Date 01/01/24      Treadmill   MPH 1.3    Grade 0    Minutes 15    METs 1.5      NuStep   Level 1    SPM 70    Minutes 15    METs 2      Prescription Details   Frequency (times per  week) 2    Duration Progress to 30 minutes of continuous aerobic without signs/symptoms of physical distress      Intensity   THRR 40-80% of Max Heartrate 55-110    Ratings of Perceived Exertion 11-13    Perceived Dyspnea 0-4      Progression   Progression Continue to progress workloads to maintain intensity without signs/symptoms of physical distress.      Resistance Training   Training Prescription Yes    Weight blue bands    Reps 10-15          Perform Capillary Blood Glucose checks as needed.  Exercise Prescription Changes:   Exercise Comments:   Exercise Goals and Review:   Exercise Goals     Row Name 10/06/23 0917             Exercise Goals   Increase Physical Activity Yes       Intervention Provide advice, education, support and counseling about physical activity/exercise needs.;Develop an individualized exercise prescription for aerobic and resistive training based on initial evaluation findings, risk stratification, comorbidities and participant's personal goals.       Expected Outcomes Short Term: Attend  rehab on a regular basis to increase amount of physical activity.;Long Term: Add in home exercise to make exercise part of routine and to increase amount of physical activity.;Long Term: Exercising regularly at least 3-5 days a week.       Increase Strength and Stamina Yes       Intervention Provide advice, education, support and counseling about physical activity/exercise needs.;Develop an individualized exercise prescription for aerobic and resistive training based on initial evaluation findings, risk stratification, comorbidities and participant's personal goals.       Expected Outcomes Short Term: Increase workloads from initial exercise prescription for resistance, speed, and METs.;Short Term: Perform resistance training exercises routinely during rehab and add in resistance training at home;Long Term: Improve cardiorespiratory fitness, muscular endurance and strength as measured by increased METs and functional capacity ( )       Able to understand and use rate of perceived exertion (RPE) scale Yes       Intervention Provide education and explanation on how to use RPE scale       Expected Outcomes Short Term: Able to use RPE daily in rehab to express subjective intensity level;Long Term:  Able to use RPE to guide intensity level when exercising independently       Able to understand and use Dyspnea scale Yes       Intervention Provide education and explanation on how to use Dyspnea scale       Expected Outcomes Long Term: Able to use Dyspnea scale to guide intensity level when exercising independently;Short Term: Able to use Dyspnea scale daily in rehab to express subjective sense of shortness of breath during exertion       Knowledge and understanding of Target Heart Rate Range (THRR) Yes       Intervention Provide education and explanation of THRR including how the numbers were predicted and where they are located for reference       Expected Outcomes Short Term: Able to state/look up  THRR;Long Term: Able to use THRR to govern intensity when exercising independently;Short Term: Able to use daily as guideline for intensity in rehab       Understanding of Exercise Prescription Yes       Intervention Provide education, explanation, and written materials on patient's individual exercise prescription       Expected  Outcomes Short Term: Able to explain program exercise prescription;Long Term: Able to explain home exercise prescription to exercise independently          Exercise Goals Re-Evaluation :   Discharge Exercise Prescription (Final Exercise Prescription Changes):   Nutrition:  Target Goals: Understanding of nutrition guidelines, daily intake of sodium 1500mg , cholesterol 200mg , calories 30% from fat and 7% or less from saturated fats, daily to have 5 or more servings of fruits and vegetables.  Biometrics:  Pre Biometrics - 10/06/23 0912       Pre Biometrics   Grip Strength 25 kg           Nutrition Therapy Plan and Nutrition Goals:   Nutrition Assessments:  MEDIFICTS Score Key: >=70 Need to make dietary changes  40-70 Heart Healthy Diet <= 40 Therapeutic Level Cholesterol Diet   Picture Your Plate Scores: <59 Unhealthy dietary pattern with much room for improvement. 41-50 Dietary pattern unlikely to meet recommendations for good health and room for improvement. 51-60 More healthful dietary pattern, with some room for improvement.  >60 Healthy dietary pattern, although there may be some specific behaviors that could be improved.    Nutrition Goals Re-Evaluation:   Nutrition Goals Discharge (Final Nutrition Goals Re-Evaluation):   Psychosocial: Target Goals: Acknowledge presence or absence of significant depression and/or stress, maximize coping skills, provide positive support system. Participant is able to verbalize types and ability to use techniques and skills needed for reducing stress and depression.  Initial Review & Psychosocial  Screening:  Initial Psych Review & Screening - 10/06/23 0918       Initial Review   Current issues with None Identified      Family Dynamics   Good Support System? Yes    Comments spouse      Barriers   Psychosocial barriers to participate in program There are no identifiable barriers or psychosocial needs.      Screening Interventions   Interventions Encouraged to exercise          Quality of Life Scores:  Scores of 19 and below usually indicate a poorer quality of life in these areas.  A difference of  2-3 points is a clinically meaningful difference.  A difference of 2-3 points in the total score of the Quality of Life Index has been associated with significant improvement in overall quality of life, self-image, physical symptoms, and general health in studies assessing change in quality of life.  PHQ-9: Review Flowsheet       10/06/2023  Depression screen PHQ 2/9  Decreased Interest 0  Down, Depressed, Hopeless 0  PHQ - 2 Score 0  Altered sleeping 1  Tired, decreased energy 0  Change in appetite 0  Feeling bad or failure about yourself  0  Trouble concentrating 0  Moving slowly or fidgety/restless 0  Suicidal thoughts 0  PHQ-9 Score 1  Difficult doing work/chores Somewhat difficult   Interpretation of Total Score  Total Score Depression Severity:  1-4 = Minimal depression, 5-9 = Mild depression, 10-14 = Moderate depression, 15-19 = Moderately severe depression, 20-27 = Severe depression   Psychosocial Evaluation and Intervention:  Psychosocial Evaluation - 10/06/23 0917       Psychosocial Evaluation & Interventions   Interventions Encouraged to exercise with the program and follow exercise prescription    Comments Manolo denies any psych/soc conerns at this time.    Expected Outcomes For Yarel to participate in rehab free psych/soc concerns.    Continue Psychosocial Services  No Follow  up required          Psychosocial Re-Evaluation:   Psychosocial  Discharge (Final Psychosocial Re-Evaluation):   Education: Education Goals: Education classes will be provided on a weekly basis, covering required topics. Participant will state understanding/return demonstration of topics presented.  Learning Barriers/Preferences:  Learning Barriers/Preferences - 10/06/23 0919       Learning Barriers/Preferences   Learning Barriers Sight    Learning Preferences None          Education Topics: Know Your Numbers Group instruction that is supported by a PowerPoint presentation. Instructor discusses importance of knowing and understanding resting, exercise, and post-exercise oxygen saturation, heart rate, and blood pressure. Oxygen saturation, heart rate, blood pressure, rating of perceived exertion, and dyspnea are reviewed along with a normal range for these values.    Exercise for the Pulmonary Patient Group instruction that is supported by a PowerPoint presentation. Instructor discusses benefits of exercise, core components of exercise, frequency, duration, and intensity of an exercise routine, importance of utilizing pulse oximetry during exercise, safety while exercising, and options of places to exercise outside of rehab.    MET Level  Group instruction provided by PowerPoint, verbal discussion, and written material to support subject matter. Instructor reviews what METs are and how to increase METs.    Pulmonary Medications Verbally interactive group education provided by instructor with focus on inhaled medications and proper administration.   Anatomy and Physiology of the Respiratory System Group instruction provided by PowerPoint, verbal discussion, and written material to support subject matter. Instructor reviews respiratory cycle and anatomical components of the respiratory system and their functions. Instructor also reviews differences in obstructive and restrictive respiratory diseases with examples of each.    Oxygen  Safety Group instruction provided by PowerPoint, verbal discussion, and written material to support subject matter. There is an overview of "What is Oxygen" and "Why do we need it".  Instructor also reviews how to create a safe environment for oxygen use, the importance of using oxygen as prescribed, and the risks of noncompliance. There is a brief discussion on traveling with oxygen and resources the patient may utilize.   Oxygen Use Group instruction provided by PowerPoint, verbal discussion, and written material to discuss how supplemental oxygen is prescribed and different types of oxygen supply systems. Resources for more information are provided.    Breathing Techniques Group instruction that is supported by demonstration and informational handouts. Instructor discusses the benefits of pursed lip and diaphragmatic breathing and detailed demonstration on how to perform both.     Risk Factor Reduction Group instruction that is supported by a PowerPoint presentation. Instructor discusses the definition of a risk factor, different risk factors for pulmonary disease, and how the heart and lungs work together.   Pulmonary Diseases Group instruction provided by PowerPoint, verbal discussion, and written material to support subject matter. Instructor gives an overview of the different type of pulmonary diseases. There is also a discussion on risk factors and symptoms as well as ways to manage the diseases.   Stress and Energy Conservation Group instruction provided by PowerPoint, verbal discussion, and written material to support subject matter. Instructor gives an overview of stress and the impact it can have on the body. Instructor also reviews ways to reduce stress. There is also a discussion on energy conservation and ways to conserve energy throughout the day.   Warning Signs and Symptoms Group instruction provided by PowerPoint, verbal discussion, and written material to support subject  matter. Instructor reviews warning signs  and symptoms of stroke, heart attack, cold and flu. Instructor also reviews ways to prevent the spread of infection.   Other Education Group or individual verbal, written, or video instructions that support the educational goals of the pulmonary rehab program.    Knowledge Questionnaire Score:  Knowledge Questionnaire Score - 10/06/23 1017       Knowledge Questionnaire Score   Pre Score 17/18          Core Components/Risk Factors/Patient Goals at Admission:  Personal Goals and Risk Factors at Admission - 10/06/23 0919       Core Components/Risk Factors/Patient Goals on Admission   Improve shortness of breath with ADL's Yes    Intervention Provide education, individualized exercise plan and daily activity instruction to help decrease symptoms of SOB with activities of daily living.    Expected Outcomes Short Term: Improve cardiorespiratory fitness to achieve a reduction of symptoms when performing ADLs;Long Term: Be able to perform more ADLs without symptoms or delay the onset of symptoms          Core Components/Risk Factors/Patient Goals Review:    Core Components/Risk Factors/Patient Goals at Discharge (Final Review):    ITP Comments: Dr. Slater Staff is Medical Director for Pulmonary Rehab at [facility].

## 2023-10-06 NOTE — Progress Notes (Addendum)
 Brandon Sanford 83 y.o. male  Initial Psychosocial Assessment  Pt psychosocial assessment reveals pt lives with their spouse. Pt is currently retired. Pt hobbies include watching tv and spending time with others. Pt reports his  stress level is low. Areas of stress/anxiety include N/A.  Pt does not exhibit signs of depression. PHQ 9 score is 0/1. Pt shows good  coping skills with positive outlook . Offered emotional support and reassurance. Will continue to monitor for psych/soc concerns.    10/06/2023 9:19 AM

## 2023-10-06 NOTE — Progress Notes (Signed)
 Brandon Sanford 83 y.o. male Pulmonary Rehab Orientation Note This patient who was referred to Pulmonary Rehab by Dr. Meade with the diagnosis of COPD 2 arrived today in Cardiac and Pulmonary Rehab. He arrived ambulatory with normal gait. He does not carry portable oxygen. Per patient, Brandon Sanford uses oxygen never. Color good, skin warm and dry. Patient is oriented to time and place. Patient's medical history, psychosocial health, and medications reviewed. Psychosocial assessment reveals patient lives with spouse. Brandon Sanford is currently retired. Patient hobbies include watching tv. Patient reports his stress level is low. Areas of stress/anxiety include health. Patient does not exhibit signs of depression. PHQ2/9 score 0/1. Brandon Sanford shows good  coping skills with positive outlook on life. Offered emotional support and reassurance. Will continue to monitor. Physical assessment performed by Brandon Levin RN. Please see their orientation physical assessment note. Brandon Sanford reports he  does take medications as prescribed. Patient states he  follows a regular  diet. The patient reports no specific efforts to gain or lose weight.. Patient's weight will be monitored closely. Demonstration and practice of PLB using pulse oximeter. Brandon Sanford able to return demonstration satisfactorily. Safety and hand hygiene in the exercise area reviewed with patient. Brandon Sanford voices understanding of the information reviewed. Department expectations discussed with patient and achievable goals were set. The patient shows enthusiasm about attending the program and we look forward to working with Brandon. Brandon Sanford completed a 6 min walk test today and is scheduled to begin exercise on 10/14/23 at 1:15 pm.  [id number] Brandon JINNY Moats, MS, ACSM-CEP

## 2023-10-14 ENCOUNTER — Encounter (HOSPITAL_COMMUNITY)
Admission: RE | Admit: 2023-10-14 | Discharge: 2023-10-14 | Disposition: A | Discharge status: Home / Self Care | Source: Ambulatory Visit | Attending: Internal Medicine | Admitting: Internal Medicine

## 2023-10-14 DIAGNOSIS — J449 Chronic obstructive pulmonary disease, unspecified: Secondary | ICD-10-CM | POA: Diagnosis not present

## 2023-10-14 NOTE — Progress Notes (Signed)
 Daily Session Note  Patient Details  Name: Brandon Sanford MRN: [id number] Date of Birth: 11-01-1940 Referring Provider:   Conrad Ports [facility] Rehab Walk Test from 10/06/2023 in [facility]  Referring Provider [place]    Encounter Date: 10/14/2023  Check In:  Session Check In - 10/14/23 1400       Check-In   Supervising physician immediately available to respond to emergencies [facility] MD immediately available    Physician(s) Damien Braver, NP    Location [facility]    Staff Present Ronal Levin, RN, Maud Moats, MS, ACSM-CEP, Exercise Physiologist;Orlan Aversa Claudene Idelia Aris BS, ACSM-CEP, Exercise Physiologist    Virtual Visit No    Medication changes reported     No    Fall or balance concerns reported    No    Tobacco Cessation No Change    Warm-up and Cool-down Performed as group-led instruction    Resistance Training Performed Yes    VAD Patient? No    PAD/SET Patient? No      Pain Assessment   Currently in Pain? No/denies    Multiple Pain Sites No          Capillary Blood Glucose: No results found for this or any previous visit (from the past 24 hours).    Social History   Tobacco Use  Smoking Status Former   Current packs/day: 0.00   Average packs/day: 1.5 packs/day for 40.0 years (60.0 ttl pk-yrs)   Types: Cigarettes   Start date: 11/12/1955   Quit date: 11/12/1995   Years since quitting: 27.9  Smokeless Tobacco Never    Goals Met:  Proper associated with RPD/PD & O2 Sat Independence with exercise equipment Exercise tolerated well No report of concerns or symptoms today Strength training completed today  Goals Unmet:  Not Applicable  Comments: Service time is from 1305 to 1440.    Dr. Slater Staff is Medical Director for Pulmonary Rehab at [facility].

## 2023-10-16 ENCOUNTER — Encounter (HOSPITAL_COMMUNITY)
Admission: RE | Admit: 2023-10-16 | Discharge: 2023-10-16 | Disposition: A | Discharge status: Home / Self Care | Source: Ambulatory Visit | Attending: Internal Medicine | Admitting: Internal Medicine

## 2023-10-16 DIAGNOSIS — J449 Chronic obstructive pulmonary disease, unspecified: Secondary | ICD-10-CM | POA: Diagnosis not present

## 2023-10-16 NOTE — Progress Notes (Signed)
 Daily Session Note  Patient Details  Name: Brandon Sanford MRN: [id number] Date of Birth: 04-09-1940 Referring Provider:   Conrad Ports [facility]monary Rehab Walk Test from 10/06/2023 in [facility]  Referring Provider [place]    Encounter Date: 10/16/2023  Check In:  Session Check In - 10/16/23 1338       Check-In   Supervising physician immediately available to respond to emergencies [facility] MD immediately available    Physician(s) Lum Louis, NP    [facility]    Staff Present Ronal Levin, RN, BSN;Casey Claudene, RT;Randi Whitefish BS, ACSM-CEP, Exercise Physiologist    Virtual Visit No    Medication changes reported     No    Fall or balance concerns reported    No    Tobacco Cessation No Change    Warm-up and Cool-down Performed as group-led instruction    Resistance Training Performed Yes    VAD Patient? No    PAD/SET Patient? No      Pain Assessment   Currently in Pain? No/denies    Multiple Pain Sites No          Capillary Blood Glucose: No results found for this or any previous visit (from the past 24 hours).    Social History   Tobacco Use  Smoking Status Former   Current packs/day: 0.00   Average packs/day: 1.5 packs/day for 40.0 years (60.0 ttl pk-yrs)   Types: Cigarettes   Start date: 11/12/1955   Quit date: 11/12/1995   Years since quitting: 27.9  Smokeless Tobacco Never    Goals Met:  Independence with exercise equipment Exercise tolerated well No report of concerns or symptoms today Strength training completed today  Goals Unmet:  Not Applicable  Comments: Service time is from 1308 to 1451    Dr. Slater Staff is Medical Director for Pulmonary Rehab at [facility].

## 2023-10-21 ENCOUNTER — Encounter (HOSPITAL_COMMUNITY)
Admission: RE | Admit: 2023-10-21 | Discharge: 2023-10-21 | Disposition: A | Source: Ambulatory Visit | Attending: Internal Medicine

## 2023-10-21 VITALS — Wt 175.7 lb

## 2023-10-21 DIAGNOSIS — J449 Chronic obstructive pulmonary disease, unspecified: Secondary | ICD-10-CM

## 2023-10-21 NOTE — Progress Notes (Signed)
 Daily Session Note  Patient Details  Name: Brandon Sanford MRN: [id number] Date of Birth: October 20, 1940 Referring Provider:   Conrad Ports [facility]monary Rehab Walk Test from 10/06/2023 in [facility]  Referring Provider [place]    Encounter Date: 10/21/2023  Check In:  Session Check In - 10/21/23 1411       Check-In   Supervising physician immediately available to respond to emergencies [facility] MD immediately available    Physician(s) Rosaline Skains, NP    [facility]    Staff Present Ronal Levin, RN, BSN;Malika Demario Claudene Neita Moats, MS, ACSM-CEP, Exercise Physiologist    Virtual Visit No    Medication changes reported     No    Fall or balance concerns reported    No    Tobacco Cessation No Change    Warm-up and Cool-down Performed as group-led instruction    Resistance Training Performed Yes    VAD Patient? No    PAD/SET Patient? No      Pain Assessment   Currently in Pain? No/denies    Multiple Pain Sites No          Capillary Blood Glucose: No results found for this or any previous visit (from the past 24 hours).   Exercise Prescription Changes - 10/21/23 1500       Response to Exercise   Blood Pressure (Admit) 112/56    Blood Pressure (Exercise) 134/66    Blood Pressure (Exit) 122/64    Heart Rate (Admit) 70 bpm    Heart Rate (Exercise) 99 bpm    Heart Rate (Exit) 89 bpm    Oxygen Saturation (Admit) 95 %    Oxygen Saturation (Exercise) 94 %    Oxygen Saturation (Exit) 93 %    Rating of Perceived Exertion (Exercise) 10    Perceived Dyspnea (Exercise) 1    Duration Continue with 30 min of aerobic exercise without signs/symptoms of physical distress.    Intensity THRR unchanged      Progression   Progression Continue to progress workloads to maintain intensity without signs/symptoms of physical distress.      Resistance Training   Training Prescription Yes    Weight blue bands     Reps 10-15    Time 10 Minutes      Treadmill   MPH 2.1    Grade 1    Minutes 15    METs 2.8      NuStep   Level 2    SPM 85    Minutes 15    METs 2.6          Social History   Tobacco Use  Smoking Status Former   Current packs/day: 0.00   Average packs/day: 1.5 packs/day for 40.0 years (60.0 ttl pk-yrs)   Types: Cigarettes   Start date: 11/12/1955   Quit date: 11/12/1995   Years since quitting: 27.9  Smokeless Tobacco Never    Goals Met:  Proper associated with RPD/PD & O2 Sat Independence with exercise equipment Exercise tolerated well No report of concerns or symptoms today Strength training completed today  Goals Unmet:  Not Applicable  Comments: Service time is from 1311 to 1445.    Dr. Slater Staff is Medical Director for Pulmonary Rehab at [facility].

## 2023-10-22 NOTE — Progress Notes (Signed)
 Pulmonary Individual Treatment Plan  Patient Details  Name: Brandon Sanford MRN: [id number] Date of Birth: 03/17/1940 Referring Provider:   Conrad Ports Pulmonary Rehab Walk Test from 10/06/2023 in [facility]  Referring Provider Meade    Initial Encounter Date:  Flowsheet Row Pulmonary Rehab Walk Test from 10/06/2023 in [facility]  Date 10/06/23    Visit Diagnosis: Stage 2 moderate COPD by GOLD classification (HCC)  Patient's Home Medications on Admission:   Current Outpatient Medications:    albuterol  (PROVENTIL ) (2.5 MG/3ML) 0.083% nebulizer solution, Use one half to one vial every 4 hours as needed, Disp: 75 mL, Rfl: 12   albuterol  (VENTOLIN  HFA) 108 (90 Base) MCG/ACT inhaler, 1- 2puffs every 4 hours if needed, Disp: 18 g, Rfl: 6   amLODipine  (NORVASC ) 5 MG tablet, Take 5 mg by mouth daily., Disp: , Rfl:    Budeson-Glycopyrrol-Formoterol  (BREZTRI  AEROSPHERE) 160-9-4.8 MCG/ACT AERO, Inhale 2 puffs into the lungs in the morning and at bedtime., Disp: 5.9 g, Rfl: 11   cefdinir  (OMNICEF ) 300 MG capsule, Take 1 capsule (300 mg total) by mouth 2 (two) times daily., Disp: 20 capsule, Rfl: 0   feeding supplement (ENSURE PLUS HIGH PROTEIN) LIQD, Take 237 mLs by mouth 3 (three) times daily between meals., Disp: 10 mL, Rfl: 0   guaiFENesin  (MUCINEX ) 600 MG 12 hr tablet, Take 1 tablet (600 mg total) by mouth 2 (two) times daily as needed for to loosen phlegm., Disp: 180 each, Rfl: 11   hydrochlorothiazide  (MICROZIDE ) 12.5 MG capsule, Take 12.5 mg by mouth daily., Disp: , Rfl:    levothyroxine  (SYNTHROID , LEVOTHROID) 100 MCG tablet, Take 100 mcg by mouth daily before breakfast., Disp: , Rfl:    menthol -cetylpyridinium (CEPACOL) 3 MG lozenge, Take 1 lozenge by mouth daily as needed for sore throat., Disp: , Rfl:    Multiple Vitamin (MULTIVITAMIN) tablet, Take 1 tablet by mouth  daily., Disp: , Rfl:    Polyethyl Glycol-Propyl Glycol 0.4-0.3 % SOLN, Place 1-2 drops into both eyes 3 (three) times daily as needed (dry eyes/irritated eyes)., Disp: , Rfl:    simvastatin  (ZOCOR ) 40 MG tablet, Take 40 mg by mouth every evening. , Disp: , Rfl:    TUMS 500 MG chewable tablet, Chew 500 mg by mouth 2 (two) times daily as needed for indigestion or heartburn., Disp: , Rfl:   Past Medical History: Past Medical History:  Diagnosis Date   Allergy    Arthritis    Asthma    as a child   Cancer (HCC)    skin - neck  pre- cancer   COPD (chronic obstructive pulmonary disease) (HCC)    Depression    pt denies   Dyspnea    With exertion   GERD (gastroesophageal reflux disease)    tums if needed   Hyperlipidemia    Hypertension    Hypothyroidism    Inguinal hernia    Plantar fasciitis    Pneumonia    Skin moles    Thyroid  disease    Umbilical hernia    repaired    Tobacco Use: Social History   Tobacco Use  Smoking Status Former   Current packs/day: 0.00   Average packs/day: 1.5 packs/day for 40.0 years (60.0 ttl pk-yrs)   Types: Cigarettes   Start date: 11/12/1955   Quit date: 11/12/1995   Years since quitting: 27.9  Smokeless Tobacco Never    Labs:  Review Flowsheet       Latest Ref Rng & Units 09/18/2006 05/07/2017  Labs for ITP Cardiac and Pulmonary Rehab  Bicarbonate - 20.9  -  TCO2 22 - 32 mmol/L 22  24   Acid-base deficit - 4.0  -    Capillary Blood Glucose: No results found for: GLUCAP   Pulmonary Assessment Scores:  Pulmonary Assessment Scores     Row Name 10/06/23 0918         ADL UCSD   ADL Phase Entry     SOB Score total 18       CAT Score   CAT Score 16       mMRC Score   mMRC Score 1       UCSD: Self-administered rating of dyspnea associated with activities of daily living (ADLs) 6-point scale (0 = not at all to 5 = maximal or unable to do because of breathlessness)  Scoring Scores range from 0 to 120.  Minimally  important difference is 5 units  CAT: CAT can identify the health impairment of COPD patients and is better correlated with disease progression.  CAT has a scoring range of zero to 40. The CAT score is classified into four groups of low (less than 10), medium (10 - 20), high (21-30) and very high (31-40) based on the impact level of disease on health status. A CAT score over 10 suggests significant symptoms.  A worsening CAT score could be explained by an exacerbation, poor medication adherence, poor inhaler technique, or progression of COPD or comorbid conditions.  CAT MCID is 2 points  mMRC: mMRC (Modified Medical Research Council) Dyspnea Scale is used to assess the degree of baseline functional disability in patients of respiratory disease due to dyspnea. No minimal important difference is established. A decrease in score of 1 point or greater is considered a positive change.   Pulmonary Function Assessment:  Pulmonary Function Assessment - 10/06/23 0918       Breath   Bilateral Breath Sounds Decreased    Shortness of Breath Yes;Limiting activity;Fear of Shortness of Breath          Exercise Target Goals: Exercise Program Goal: Individual exercise prescription set using results from initial 6 min walk test and THRR while considering  patient's activity barriers and safety.   Exercise Prescription Goal: Initial exercise prescription builds to 30-45 minutes a day of aerobic activity, 2-3 days per week.  Home exercise guidelines will be given to patient during program as part of exercise prescription that the participant will acknowledge.  Activity Barriers & Risk Stratification:  Activity Barriers & Cardiac Risk Stratification - 10/06/23 0919       Activity Barriers & Cardiac Risk Stratification   Activity Barriers Deconditioning;Muscular Weakness;Shortness of Breath;Left Hip Replacement;Arthritis          6 Minute Walk:  6 Minute Walk     Row Name 10/06/23 1025          6 Minute Walk   Phase Initial     Distance 1050 feet     Walk Time 6 minutes     # of Rest Breaks 0     MPH 1.99     METS 1.71     RPE 11     Perceived Dyspnea  1     VO2 Peak 5.99     Symptoms No     Resting HR 67 bpm     Resting BP 108/56     Resting Oxygen Saturation  95 %     Exercise Oxygen Saturation  during 6 min walk 92 %     Max Ex. HR 80 bpm     Max Ex. BP 120/60     2 Minute Post BP 118/60       Interval HR   1 Minute HR 72     2 Minute HR 77     3 Minute HR 80     4 Minute HR 79     5 Minute HR 78     6 Minute HR 80     2 Minute Post HR 63     Interval Heart Rate? Yes       Interval Oxygen   Interval Oxygen? Yes     Baseline Oxygen Saturation % 95 %     1 Minute Oxygen Saturation % 96 %     1 Minute Liters of Oxygen 0 L     2 Minute Oxygen Saturation % 91 %     2 Minute Liters of Oxygen 0 L     3 Minute Oxygen Saturation % 92 %     3 Minute Liters of Oxygen 0 L     4 Minute Oxygen Saturation % 96 %     4 Minute Liters of Oxygen 0 L     5 Minute Oxygen Saturation % 96 %     5 Minute Liters of Oxygen 0 L     6 Minute Oxygen Saturation % 95 %     6 Minute Liters of Oxygen 0 L     2 Minute Post Oxygen Saturation % 97 %     2 Minute Post Liters of Oxygen 0 L        Oxygen Initial Assessment:  Oxygen Initial Assessment - 10/06/23 0918       Home Oxygen   Home Oxygen Device None    Sleep Oxygen Prescription None    Home Exercise Oxygen Prescription None    Home Resting Oxygen Prescription None      Initial 6 min Walk   Oxygen Used None      Program Oxygen Prescription   Program Oxygen Prescription None      Intervention   Short Term Goals To learn and understand importance of maintaining oxygen saturations>88%;To learn and demonstrate proper use of respiratory medications;To learn and understand importance of monitoring SPO2 with pulse oximeter and demonstrate accurate use of the pulse oximeter.;To learn and demonstrate proper pursed lip  breathing techniques or other breathing techniques.     Long  Term Goals Verbalizes importance of monitoring SPO2 with pulse oximeter and return demonstration;Maintenance of O2 saturations>88%;Exhibits proper breathing techniques, such as pursed lip breathing or other method taught during program session;Compliance with respiratory medication;Demonstrates proper use of MDI's          Oxygen Re-Evaluation:  Oxygen Re-Evaluation     Row Name 10/14/23 0902             Program Oxygen Prescription   Program Oxygen Prescription None         Home Oxygen   Home Oxygen Device None       Sleep Oxygen Prescription None       Home Exercise Oxygen Prescription None       Home Resting Oxygen Prescription None         Goals/Expected Outcomes   Short Term Goals To learn and understand importance of maintaining oxygen saturations>88%;To learn and demonstrate proper use of respiratory medications;To  learn and understand importance of monitoring SPO2 with pulse oximeter and demonstrate accurate use of the pulse oximeter.;To learn and demonstrate proper pursed lip breathing techniques or other breathing techniques.        Long  Term Goals Verbalizes importance of monitoring SPO2 with pulse oximeter and return demonstration;Maintenance of O2 saturations>88%;Exhibits proper breathing techniques, such as pursed lip breathing or other method taught during program session;Compliance with respiratory medication;Demonstrates proper use of MDI's       Goals/Expected Outcomes Compliance and understanding of oxygen saturation monitoring and breathing techniques to decrease shortness of breath.          Oxygen Discharge (Final Oxygen Re-Evaluation):  Oxygen Re-Evaluation - 10/14/23 0902       Program Oxygen Prescription   Program Oxygen Prescription None      Home Oxygen   Home Oxygen Device None    Sleep Oxygen Prescription None    Home Exercise Oxygen Prescription None    Home Resting Oxygen  Prescription None      Goals/Expected Outcomes   Short Term Goals To learn and understand importance of maintaining oxygen saturations>88%;To learn and demonstrate proper use of respiratory medications;To learn and understand importance of monitoring SPO2 with pulse oximeter and demonstrate accurate use of the pulse oximeter.;To learn and demonstrate proper pursed lip breathing techniques or other breathing techniques.     Long  Term Goals Verbalizes importance of monitoring SPO2 with pulse oximeter and return demonstration;Maintenance of O2 saturations>88%;Exhibits proper breathing techniques, such as pursed lip breathing or other method taught during program session;Compliance with respiratory medication;Demonstrates proper use of MDI's    Goals/Expected Outcomes Compliance and understanding of oxygen saturation monitoring and breathing techniques to decrease shortness of breath.          Initial Exercise Prescription:  Initial Exercise Prescription - 10/06/23 1000       Date of Initial Exercise RX and Referring Provider   Date 10/06/23    Referring Provider Meade    Expected Discharge Date 01/01/24      Treadmill   MPH 1.3    Grade 0    Minutes 15    METs 1.5      NuStep   Level 1    SPM 70    Minutes 15    METs 2      Prescription Details   Frequency (times per week) 2    Duration Progress to 30 minutes of continuous aerobic without signs/symptoms of physical distress      Intensity   THRR 40-80% of Max Heartrate 55-110    Ratings of Perceived Exertion 11-13    Perceived Dyspnea 0-4      Progression   Progression Continue to progress workloads to maintain intensity without signs/symptoms of physical distress.      Resistance Training   Training Prescription Yes    Weight blue bands    Reps 10-15          Perform Capillary Blood Glucose checks as needed.  Exercise Prescription Changes:   Exercise Prescription Changes     Row Name 10/21/23 1500              Response to Exercise   Blood Pressure (Admit) 112/56       Blood Pressure (Exercise) 134/66       Blood Pressure (Exit) 122/64       Heart Rate (Admit) 70 bpm       Heart Rate (Exercise) 99 bpm  Heart Rate (Exit) 89 bpm       Oxygen Saturation (Admit) 95 %       Oxygen Saturation (Exercise) 94 %       Oxygen Saturation (Exit) 93 %       Rating of Perceived Exertion (Exercise) 10       Perceived Dyspnea (Exercise) 1       Duration Continue with 30 min of aerobic exercise without signs/symptoms of physical distress.       Intensity THRR unchanged         Progression   Progression Continue to progress workloads to maintain intensity without signs/symptoms of physical distress.         Resistance Training   Training Prescription Yes       Weight blue bands       Reps 10-15       Time 10 Minutes         Treadmill   MPH 2.1       Grade 1       Minutes 15       METs 2.8         NuStep   Level 2       SPM 85       Minutes 15       METs 2.6          Exercise Comments:   Exercise Goals and Review:   Exercise Goals     Row Name 10/06/23 0917             Exercise Goals   Increase Physical Activity Yes       Intervention Provide advice, education, support and counseling about physical activity/exercise needs.;Develop an individualized exercise prescription for aerobic and resistive training based on initial evaluation findings, risk stratification, comorbidities and participant's personal goals.       Expected Outcomes Short Term: Attend rehab on a regular basis to increase amount of physical activity.;Long Term: Add in home exercise to make exercise part of routine and to increase amount of physical activity.;Long Term: Exercising regularly at least 3-5 days a week.       Increase Strength and Stamina Yes       Intervention Provide advice, education, support and counseling about physical activity/exercise needs.;Develop an individualized exercise prescription  for aerobic and resistive training based on initial evaluation findings, risk stratification, comorbidities and participant's personal goals.       Expected Outcomes Short Term: Increase workloads from initial exercise prescription for resistance, speed, and METs.;Short Term: Perform resistance training exercises routinely during rehab and add in resistance training at home;Long Term: Improve cardiorespiratory fitness, muscular endurance and strength as measured by increased METs and functional capacity ( )       Able to understand and use rate of perceived exertion (RPE) scale Yes       Intervention Provide education and explanation on how to use RPE scale       Expected Outcomes Short Term: Able to use RPE daily in rehab to express subjective intensity level;Long Term:  Able to use RPE to guide intensity level when exercising independently       Able to understand and use Dyspnea scale Yes       Intervention Provide education and explanation on how to use Dyspnea scale       Expected Outcomes Long Term: Able to use Dyspnea scale to guide intensity level when exercising independently;Short Term: Able to use Dyspnea scale daily in rehab to express  subjective sense of shortness of breath during exertion       Knowledge and understanding of Target Heart Rate Range (THRR) Yes       Intervention Provide education and explanation of THRR including how the numbers were predicted and where they are located for reference       Expected Outcomes Short Term: Able to state/look up THRR;Long Term: Able to use THRR to govern intensity when exercising independently;Short Term: Able to use daily as guideline for intensity in rehab       Understanding of Exercise Prescription Yes       Intervention Provide education, explanation, and written materials on patient's individual exercise prescription       Expected Outcomes Short Term: Able to explain program exercise prescription;Long Term: Able to explain home  exercise prescription to exercise independently          Exercise Goals Re-Evaluation :  Exercise Goals Re-Evaluation     Row Name 10/14/23 0901             Exercise Goal Re-Evaluation   Exercise Goals Review Increase Physical Activity;Able to understand and use Dyspnea scale;Understanding of Exercise Prescription;Increase Strength and Stamina;Knowledge and understanding of Target Heart Rate Range (THRR);Able to understand and use rate of perceived exertion (RPE) scale       Comments Pt will begin exercise 9/23. Will progress as tolerated.       Expected Outcomes Through exercise at rehab and home, the patient will decrease shortness of breath and feel confident in carrying out an exercise regimen at home.          Discharge Exercise Prescription (Final Exercise Prescription Changes):  Exercise Prescription Changes - 10/21/23 1500       Response to Exercise   Blood Pressure (Admit) 112/56    Blood Pressure (Exercise) 134/66    Blood Pressure (Exit) 122/64    Heart Rate (Admit) 70 bpm    Heart Rate (Exercise) 99 bpm    Heart Rate (Exit) 89 bpm    Oxygen Saturation (Admit) 95 %    Oxygen Saturation (Exercise) 94 %    Oxygen Saturation (Exit) 93 %    Rating of Perceived Exertion (Exercise) 10    Perceived Dyspnea (Exercise) 1    Duration Continue with 30 min of aerobic exercise without signs/symptoms of physical distress.    Intensity THRR unchanged      Progression   Progression Continue to progress workloads to maintain intensity without signs/symptoms of physical distress.      Resistance Training   Training Prescription Yes    Weight blue bands    Reps 10-15    Time 10 Minutes      Treadmill   MPH 2.1    Grade 1    Minutes 15    METs 2.8      NuStep   Level 2    SPM 85    Minutes 15    METs 2.6          Nutrition:  Target Goals: Understanding of nutrition guidelines, daily intake of sodium 1500mg , cholesterol 200mg , calories 30% from fat and 7% or  less from saturated fats, daily to have 5 or more servings of fruits and vegetables.  Biometrics:  Pre Biometrics - 10/06/23 0912       Pre Biometrics   Grip Strength 25 kg           Nutrition Therapy Plan and Nutrition Goals:   Nutrition Assessments:  MEDIFICTS Score Key: >=  70 Need to make dietary changes  40-70 Heart Healthy Diet <= 40 Therapeutic Level Cholesterol Diet   Picture Your Plate Scores: <59 Unhealthy dietary pattern with much room for improvement. 41-50 Dietary pattern unlikely to meet recommendations for good health and room for improvement. 51-60 More healthful dietary pattern, with some room for improvement.  >60 Healthy dietary pattern, although there may be some specific behaviors that could be improved.    Nutrition Goals Re-Evaluation:   Nutrition Goals Discharge (Final Nutrition Goals Re-Evaluation):   Psychosocial: Target Goals: Acknowledge presence or absence of significant depression and/or stress, maximize coping skills, provide positive support system. Participant is able to verbalize types and ability to use techniques and skills needed for reducing stress and depression.  Initial Review & Psychosocial Screening:  Initial Psych Review & Screening - 10/06/23 0918       Initial Review   Current issues with None Identified      Family Dynamics   Good Support System? Yes    Comments spouse      Barriers   Psychosocial barriers to participate in program There are no identifiable barriers or psychosocial needs.      Screening Interventions   Interventions Encouraged to exercise          Quality of Life Scores:  Scores of 19 and below usually indicate a poorer quality of life in these areas.  A difference of  2-3 points is a clinically meaningful difference.  A difference of 2-3 points in the total score of the Quality of Life Index has been associated with significant improvement in overall quality of life, self-image, physical  symptoms, and general health in studies assessing change in quality of life.  PHQ-9: Review Flowsheet       10/06/2023  Depression screen PHQ 2/9  Decreased Interest 0  Down, Depressed, Hopeless 0  PHQ - 2 Score 0  Altered sleeping 1  Tired, decreased energy 0  Change in appetite 0  Feeling bad or failure about yourself  0  Trouble concentrating 0  Moving slowly or fidgety/restless 0  Suicidal thoughts 0  PHQ-9 Score 1  Difficult doing work/chores Somewhat difficult   Interpretation of Total Score  Total Score Depression Severity:  1-4 = Minimal depression, 5-9 = Mild depression, 10-14 = Moderate depression, 15-19 = Moderately severe depression, 20-27 = Severe depression   Psychosocial Evaluation and Intervention:  Psychosocial Evaluation - 10/06/23 0917       Psychosocial Evaluation & Interventions   Interventions Encouraged to exercise with the program and follow exercise prescription    Comments Burel denies any psych/soc conerns at this time.    Expected Outcomes For Eden to participate in rehab free psych/soc concerns.    Continue Psychosocial Services  No Follow up required          Psychosocial Re-Evaluation:  Psychosocial Re-Evaluation     Row Name 10/13/23 [phone]             Psychosocial Re-Evaluation   Current issues with None Identified       Comments Monthly psychosocial re-evaluation as follows: No changes since orientation. Derrill is scheduled to start the program this week. PHQ 2/9 scores were 0/1 at orientation and Manu denied an additional needs or resources at that time.       Expected Outcomes For Westly to participate in pulmonary rehab free of any psychosocial barriers or concerns.       Interventions Encouraged to attend Pulmonary Rehabilitation for the exercise  Continue Psychosocial Services  No Follow up required          Psychosocial Discharge (Final Psychosocial Re-Evaluation):  Psychosocial Re-Evaluation - 10/13/23 9066        Psychosocial Re-Evaluation   Current issues with None Identified    Comments Monthly psychosocial re-evaluation as follows: No changes since orientation. Tyshaun is scheduled to start the program this week. PHQ 2/9 scores were 0/1 at orientation and Augusto denied an additional needs or resources at that time.    Expected Outcomes For Burhanuddin to participate in pulmonary rehab free of any psychosocial barriers or concerns.    Interventions Encouraged to attend Pulmonary Rehabilitation for the exercise    Continue Psychosocial Services  No Follow up required          Education: Education Goals: Education classes will be provided on a weekly basis, covering required topics. Participant will state understanding/return demonstration of topics presented.  Learning Barriers/Preferences:  Learning Barriers/Preferences - 10/06/23 0919       Learning Barriers/Preferences   Learning Barriers Sight    Learning Preferences None          Education Topics: Know Your Numbers Group instruction that is supported by a PowerPoint presentation. Instructor discusses importance of knowing and understanding resting, exercise, and post-exercise oxygen saturation, heart rate, and blood pressure. Oxygen saturation, heart rate, blood pressure, rating of perceived exertion, and dyspnea are reviewed along with a normal range for these values.    Exercise for the Pulmonary Patient Group instruction that is supported by a PowerPoint presentation. Instructor discusses benefits of exercise, core components of exercise, frequency, duration, and intensity of an exercise routine, importance of utilizing pulse oximetry during exercise, safety while exercising, and options of places to exercise outside of rehab.    MET Level  Group instruction provided by PowerPoint, verbal discussion, and written material to support subject matter. Instructor reviews what METs are and how to increase METs.    Pulmonary Medications Verbally  interactive group education provided by instructor with focus on inhaled medications and proper administration.   Anatomy and Physiology of the Respiratory System Group instruction provided by PowerPoint, verbal discussion, and written material to support subject matter. Instructor reviews respiratory cycle and anatomical components of the respiratory system and their functions. Instructor also reviews differences in obstructive and restrictive respiratory diseases with examples of each.    Oxygen Safety Group instruction provided by PowerPoint, verbal discussion, and written material to support subject matter. There is an overview of "What is Oxygen" and "Why do we need it".  Instructor also reviews how to create a safe environment for oxygen use, the importance of using oxygen as prescribed, and the risks of noncompliance. There is a brief discussion on traveling with oxygen and resources the patient may utilize. Flowsheet Row PULMONARY REHAB CHRONIC OBSTRUCTIVE PULMONARY DISEASE from 10/16/2023 in [facility]  Date 10/16/23  Educator RN  Instruction Review Code 1- Verbalizes Understanding    Oxygen Use Group instruction provided by PowerPoint, verbal discussion, and written material to discuss how supplemental oxygen is prescribed and different types of oxygen supply systems. Resources for more information are provided.    Breathing Techniques Group instruction that is supported by demonstration and informational handouts. Instructor discusses the benefits of pursed lip and diaphragmatic breathing and detailed demonstration on how to perform both.     Risk Factor Reduction Group instruction that is supported by a PowerPoint presentation. Instructor discusses the definition  of a risk factor, different risk factors for pulmonary disease, and how the heart and lungs work together.   Pulmonary Diseases Group instruction provided by  PowerPoint, verbal discussion, and written material to support subject matter. Instructor gives an overview of the different type of pulmonary diseases. There is also a discussion on risk factors and symptoms as well as ways to manage the diseases.   Stress and Energy Conservation Group instruction provided by PowerPoint, verbal discussion, and written material to support subject matter. Instructor gives an overview of stress and the impact it can have on the body. Instructor also reviews ways to reduce stress. There is also a discussion on energy conservation and ways to conserve energy throughout the day.   Warning Signs and Symptoms Group instruction provided by PowerPoint, verbal discussion, and written material to support subject matter. Instructor reviews warning signs and symptoms of stroke, heart attack, cold and flu. Instructor also reviews ways to prevent the spread of infection.   Other Education Group or individual verbal, written, or video instructions that support the educational goals of the pulmonary rehab program.    Knowledge Questionnaire Score:  Knowledge Questionnaire Score - 10/06/23 1017       Knowledge Questionnaire Score   Pre Score 17/18          Core Components/Risk Factors/Patient Goals at Admission:  Personal Goals and Risk Factors at Admission - 10/06/23 0919       Core Components/Risk Factors/Patient Goals on Admission   Improve shortness of breath with ADL's Yes    Intervention Provide education, individualized exercise plan and daily activity instruction to help decrease symptoms of SOB with activities of daily living.    Expected Outcomes Short Term: Improve cardiorespiratory fitness to achieve a reduction of symptoms when performing ADLs;Long Term: Be able to perform more ADLs without symptoms or delay the onset of symptoms          Core Components/Risk Factors/Patient Goals Review:   Goals and Risk Factor Review     Row Name 10/13/23 [phone]              Core Components/Risk Factors/Patient Goals Review   Personal Goals Review Develop more efficient breathing techniques such as purse lipped breathing and diaphragmatic breathing and practicing self-pacing with activity.;Improve shortness of breath with ADL's       Review Monthly review of patient's Core Components/Risk Factors/Patient Goals are as follows: Unable to assess goals. Radek is scheduled to start the program this week.       Expected Outcomes Pt will show progress toward meeting expected goals and outcomes.          Core Components/Risk Factors/Patient Goals at Discharge (Final Review):   Goals and Risk Factor Review - 10/13/23 0937       Core Components/Risk Factors/Patient Goals Review   Personal Goals Review Develop more efficient breathing techniques such as purse lipped breathing and diaphragmatic breathing and practicing self-pacing with activity.;Improve shortness of breath with ADL's    Review Monthly review of patient's Core Components/Risk Factors/Patient Goals are as follows: Unable to assess goals. Lucious is scheduled to start the program this week.    Expected Outcomes Pt will show progress toward meeting expected goals and outcomes.          ITP Comments:   Comments: Pt is making expected progress toward Pulmonary Rehab goals after completing 3 session(s). Recommend continued exercise, life style modification, education, and utilization of breathing techniques to increase stamina and strength, while also  decreasing shortness of breath with exertion.  Dr. Slater Staff is Medical Director for Pulmonary Rehab at [facility].

## 2023-10-23 ENCOUNTER — Telehealth (HOSPITAL_COMMUNITY): Payer: Self-pay

## 2023-10-23 ENCOUNTER — Encounter (HOSPITAL_COMMUNITY)
Admission: RE | Admit: 2023-10-23 | Discharge: 2023-10-23 | Disposition: A | Discharge status: Home / Self Care | Source: Ambulatory Visit | Attending: Internal Medicine | Admitting: Internal Medicine

## 2023-10-23 DIAGNOSIS — J449 Chronic obstructive pulmonary disease, unspecified: Secondary | ICD-10-CM | POA: Insufficient documentation

## 2023-10-23 NOTE — Progress Notes (Signed)
 Daily Session Note  Patient Details  Name: Brandon Sanford MRN: [id number] Date of Birth: 05-07-1940 Referring Provider:   Conrad Ports Pulmonary Rehab Walk Test from 10/06/2023 in [facility]  Referring Provider Meade    Encounter Date: 10/23/2023  Check In:  Session Check In - 10/23/23 1327       Check-In   Supervising physician immediately available to respond to emergencies [facility] MD immediately available    Physician(s) Jackee Wyn Raddle, NP    Location [facility]    Staff Present Ronal Levin, RN, BSN;Steve Youngberg Claudene Neita Moats, MS, ACSM-CEP, Exercise Physiologist    Virtual Visit No    Medication changes reported     No    Fall or balance concerns reported    No    Tobacco Cessation No Change    Warm-up and Cool-down Performed as group-led instruction    Resistance Training Performed Yes    VAD Patient? No    PAD/SET Patient? No      Pain Assessment   Currently in Pain? No/denies    Multiple Pain Sites No          Capillary Blood Glucose: No results found for this or any previous visit (from the past 24 hours).    Social History   Tobacco Use  Smoking Status Former   Current packs/day: 0.00   Average packs/day: 1.5 packs/day for 40.0 years (60.0 ttl pk-yrs)   Types: Cigarettes   Start date: 11/12/1955   Quit date: 11/12/1995   Years since quitting: 27.9  Smokeless Tobacco Never    Goals Met:  Proper associated with RPD/PD & O2 Sat Independence with exercise equipment Exercise tolerated well No report of concerns or symptoms today Strength training completed today  Goals Unmet:  Not Applicable  Comments: Service time is from 1307 to 1445.    Dr. Slater Staff is Medical Director for Pulmonary Rehab at [facility].

## 2023-10-23 NOTE — Telephone Encounter (Signed)
 Dr. Meade, Arley needs an order for an increase in his target heart rate up to 140 while exercising in Pulmonary rehab. Are you okay with this?  Thanks, Augustin Sharps, RRT

## 2023-10-24 NOTE — Telephone Encounter (Signed)
 Yes that's fine

## 2023-10-28 ENCOUNTER — Encounter (HOSPITAL_COMMUNITY)
Admission: RE | Admit: 2023-10-28 | Discharge: 2023-10-28 | Disposition: A | Source: Ambulatory Visit | Attending: Internal Medicine

## 2023-10-28 DIAGNOSIS — J449 Chronic obstructive pulmonary disease, unspecified: Secondary | ICD-10-CM

## 2023-10-28 NOTE — Progress Notes (Signed)
 Daily Session Note  Patient Details  Name: Brandon Sanford MRN: [id number] Date of Birth: Aug 28, 1940 Referring Provider:   Conrad Ports Pulmonary Rehab Walk Test from 10/06/2023 in [facility]  Referring Provider Meade    Encounter Date: 10/28/2023  Check In:  Session Check In - 10/28/23 1413       Check-In   Supervising physician immediately available to respond to emergencies [facility] MD immediately available    Physician(s) Barnie Press, NP    Location [facility]    Staff Present Ronal Levin, RN, BSN;Casey Claudene Neita Moats, MS, ACSM-CEP, Exercise Physiologist    Virtual Visit No    Medication changes reported     No    Fall or balance concerns reported    No    Tobacco Cessation No Change    Warm-up and Cool-down Performed as group-led instruction    Resistance Training Performed Yes    VAD Patient? No    PAD/SET Patient? No      Pain Assessment   Currently in Pain? No/denies    Multiple Pain Sites No          Capillary Blood Glucose: No results found for this or any previous visit (from the past 24 hours).    Social History   Tobacco Use  Smoking Status Former   Current packs/day: 0.00   Average packs/day: 1.5 packs/day for 40.0 years (60.0 ttl pk-yrs)   Types: Cigarettes   Start date: 11/12/1955   Quit date: 11/12/1995   Years since quitting: 27.9  Smokeless Tobacco Never    Goals Met:  Independence with exercise equipment Exercise tolerated well No report of concerns or symptoms today Strength training completed today  Goals Unmet:  Not Applicable  Comments: Service time is from 1307 to 1436    Dr. Slater Staff is Medical Director for Pulmonary Rehab at [facility].

## 2023-10-29 ENCOUNTER — Telehealth (HOSPITAL_COMMUNITY): Payer: Self-pay

## 2023-10-29 NOTE — Progress Notes (Signed)
 Discharge Progress Report  Patient Details  Name: Brandon Sanford MRN: [id number] Date of Birth: 04-17-1940 Referring Provider:   Conrad Ports Pulmonary Rehab Walk Test from 10/06/2023 in [facility]  Referring Provider Desai     Number of Visits: 5  Reason for Discharge:  Early Exit:  states he feels confident exercising on his own  Smoking History:  Social History   Tobacco Use  Smoking Status Former   Current packs/day: 0.00   Average packs/day: 1.5 packs/day for 40.0 years (60.0 ttl pk-yrs)   Types: Cigarettes   Start date: 11/12/1955   Quit date: 11/12/1995   Years since quitting: 27.9  Smokeless Tobacco Never    Diagnosis:  Stage 2 moderate COPD by GOLD classification (HCC)  ADL UCSD:  Pulmonary Assessment Scores     Row Name 10/06/23 0918         ADL UCSD   ADL Phase Entry     SOB Score total 18       CAT Score   CAT Score 16       mMRC Score   mMRC Score 1        Initial Exercise Prescription:  Initial Exercise Prescription - 10/06/23 1000       Date of Initial Exercise RX and Referring Provider   Date 10/06/23    Referring Provider Meade    Expected Discharge Date 01/01/24      Treadmill   MPH 1.3    Grade 0    Minutes 15    METs 1.5      NuStep   Level 1    SPM 70    Minutes 15    METs 2      Prescription Details   Frequency (times per week) 2    Duration Progress to 30 minutes of continuous aerobic without signs/symptoms of physical distress      Intensity   THRR 40-80% of Max Heartrate 55-110    Ratings of Perceived Exertion 11-13    Perceived Dyspnea 0-4      Progression   Progression Continue to progress workloads to maintain intensity without signs/symptoms of physical distress.      Resistance Training   Training Prescription Yes    Weight blue bands    Reps 10-15          Discharge Exercise Prescription (Final Exercise Prescription Changes):  Exercise  Prescription Changes - 10/21/23 1500       Response to Exercise   Blood Pressure (Admit) 112/56    Blood Pressure (Exercise) 134/66    Blood Pressure (Exit) 122/64    Heart Rate (Admit) 70 bpm    Heart Rate (Exercise) 99 bpm    Heart Rate (Exit) 89 bpm    Oxygen Saturation (Admit) 95 %    Oxygen Saturation (Exercise) 94 %    Oxygen Saturation (Exit) 93 %    Rating of Perceived Exertion (Exercise) 10    Perceived Dyspnea (Exercise) 1    Duration Continue with 30 min of aerobic exercise without signs/symptoms of physical distress.    Intensity THRR unchanged      Progression   Progression Continue to progress workloads to maintain intensity without signs/symptoms of physical distress.      Resistance Training   Training Prescription Yes    Weight blue bands    Reps 10-15    Time 10 Minutes      Treadmill   MPH 2.1  Grade 1    Minutes 15    METs 2.8      NuStep   Level 2    SPM 85    Minutes 15    METs 2.6          Functional Capacity:  6 Minute Walk     Row Name 10/06/23 1025         6 Minute Walk   Phase Initial     Distance 1050 feet     Walk Time 6 minutes     # of Rest Breaks 0     MPH 1.99     METS 1.71     RPE 11     Perceived Dyspnea  1     VO2 Peak 5.99     Symptoms No     Resting HR 67 bpm     Resting BP 108/56     Resting Oxygen Saturation  95 %     Exercise Oxygen Saturation  during 6 min walk 92 %     Max Ex. HR 80 bpm     Max Ex. BP 120/60     2 Minute Post BP 118/60       Interval HR   1 Minute HR 72     2 Minute HR 77     3 Minute HR 80     4 Minute HR 79     5 Minute HR 78     6 Minute HR 80     2 Minute Post HR 63     Interval Heart Rate? Yes       Interval Oxygen   Interval Oxygen? Yes     Baseline Oxygen Saturation % 95 %     1 Minute Oxygen Saturation % 96 %     1 Minute Liters of Oxygen 0 L     2 Minute Oxygen Saturation % 91 %     2 Minute Liters of Oxygen 0 L     3 Minute Oxygen Saturation % 92 %     3  Minute Liters of Oxygen 0 L     4 Minute Oxygen Saturation % 96 %     4 Minute Liters of Oxygen 0 L     5 Minute Oxygen Saturation % 96 %     5 Minute Liters of Oxygen 0 L     6 Minute Oxygen Saturation % 95 %     6 Minute Liters of Oxygen 0 L     2 Minute Post Oxygen Saturation % 97 %     2 Minute Post Liters of Oxygen 0 L        Psychological, QOL, Others - Outcomes: PHQ 2/9:    10/06/2023    9:14 AM  Depression screen PHQ 2/9  Decreased Interest 0  Down, Depressed, Hopeless 0  PHQ - 2 Score 0  Altered sleeping 1  Tired, decreased energy 0  Change in appetite 0  Feeling bad or failure about yourself  0  Trouble concentrating 0  Moving slowly or fidgety/restless 0  Suicidal thoughts 0  PHQ-9 Score 1  Difficult doing work/chores Somewhat difficult    Quality of Life:   Personal Goals: Goals established at orientation with interventions provided to work toward goal.  Personal Goals and Risk Factors at Admission - 10/06/23 0919       Core Components/Risk Factors/Patient Goals on Admission   Improve shortness of breath with ADL's Yes  Intervention Provide education, individualized exercise plan and daily activity instruction to help decrease symptoms of SOB with activities of daily living.    Expected Outcomes Short Term: Improve cardiorespiratory fitness to achieve a reduction of symptoms when performing ADLs;Long Term: Be able to perform more ADLs without symptoms or delay the onset of symptoms           Personal Goals Discharge:  Goals and Risk Factor Review     Row Name 10/13/23 9062 10/29/23 1238           Core Components/Risk Factors/Patient Goals Review   Personal Goals Review Develop more efficient breathing techniques such as purse lipped breathing and diaphragmatic breathing and practicing self-pacing with activity.;Improve shortness of breath with ADL's Develop more efficient breathing techniques such as purse lipped breathing and diaphragmatic  breathing and practicing self-pacing with activity.;Improve shortness of breath with ADL's      Review Monthly review of patient's Core Components/Risk Factors/Patient Goals are as follows: Unable to assess goals. Brandon Sanford is scheduled to start the program this week. Brandon Sanford decided to withdraw from the PR program. He stated he feels confidant exercising independently at the gym. Brandon Sanford completed 5 sessions. Discharge review of patient's Core Components/Risk Factors/Patient Goals are as follows: Goal met for improving his shortness of breath with ADLs. Brandon Sanford worked hard building up his strength and stamina. He has been exercising for years before joining the program and he had very little shortness of breath with exertion. He was able to maintain his oxygen saturation on room air with exercise. Goal met for developing more efficient breathing techniques such as purse lipped breathing and diaphragmatic breathing; and practicing self-pacing with activity. Brandon Sanford could perform purse lipped breathing while short of breath. He demonstrated this while performing the warmup and exercising. He can initiate PLB on his own. He works on diaphragmatic breathing at home.      Expected Outcomes Pt will show progress toward meeting expected goals and outcomes. Pt will continue to progress toward meeting expected goals and outcomes post Pulm Rehab         Exercise Goals and Review:  Exercise Goals     Row Name 10/06/23 0917             Exercise Goals   Increase Physical Activity Yes       Intervention Provide advice, education, support and counseling about physical activity/exercise needs.;Develop an individualized exercise prescription for aerobic and resistive training based on initial evaluation findings, risk stratification, comorbidities and participant's personal goals.       Expected Outcomes Short Term: Attend rehab on a regular basis to increase amount of physical activity.;Long Term: Add in home exercise to make  exercise part of routine and to increase amount of physical activity.;Long Term: Exercising regularly at least 3-5 days a week.       Increase Strength and Stamina Yes       Intervention Provide advice, education, support and counseling about physical activity/exercise needs.;Develop an individualized exercise prescription for aerobic and resistive training based on initial evaluation findings, risk stratification, comorbidities and participant's personal goals.       Expected Outcomes Short Term: Increase workloads from initial exercise prescription for resistance, speed, and METs.;Short Term: Perform resistance training exercises routinely during rehab and add in resistance training at home;Long Term: Improve cardiorespiratory fitness, muscular endurance and strength as measured by increased METs and functional capacity ( )       Able to understand and use rate of perceived exertion (RPE)  scale Yes       Intervention Provide education and explanation on how to use RPE scale       Expected Outcomes Short Term: Able to use RPE daily in rehab to express subjective intensity level;Long Term:  Able to use RPE to guide intensity level when exercising independently       Able to understand and use Dyspnea scale Yes       Intervention Provide education and explanation on how to use Dyspnea scale       Expected Outcomes Long Term: Able to use Dyspnea scale to guide intensity level when exercising independently;Short Term: Able to use Dyspnea scale daily in rehab to express subjective sense of shortness of breath during exertion       Knowledge and understanding of Target Heart Rate Range (THRR) Yes       Intervention Provide education and explanation of THRR including how the numbers were predicted and where they are located for reference       Expected Outcomes Short Term: Able to state/look up THRR;Long Term: Able to use THRR to govern intensity when exercising independently;Short Term: Able to use daily  as guideline for intensity in rehab       Understanding of Exercise Prescription Yes       Intervention Provide education, explanation, and written materials on patient's individual exercise prescription       Expected Outcomes Short Term: Able to explain program exercise prescription;Long Term: Able to explain home exercise prescription to exercise independently          Exercise Goals Re-Evaluation:  Exercise Goals Re-Evaluation     Row Name 10/14/23 0901 10/29/23 1116           Exercise Goal Re-Evaluation   Exercise Goals Review Increase Physical Activity;Able to understand and use Dyspnea scale;Understanding of Exercise Prescription;Increase Strength and Stamina;Knowledge and understanding of Target Heart Rate Range (THRR);Able to understand and use rate of perceived exertion (RPE) scale Increase Physical Activity;Able to understand and use Dyspnea scale;Understanding of Exercise Prescription;Increase Strength and Stamina;Knowledge and understanding of Target Heart Rate Range (THRR);Able to understand and use rate of perceived exertion (RPE) scale      Comments Pt will begin exercise 9/23. Will progress as tolerated. Brandon Sanford has completed 5 exercise sessions. He exercises for 15 min on the Nustep and treadmill. He averages 2.7 METs at level 2 on the Nustep and 2 METs at 2 mph and 1% incline on the treadmill. He performs the warmup and cooldown standing without limitations. Brandon Sanford wishes to be discharged as he feels he can exercise on his own.      Expected Outcomes Through exercise at rehab and home, the patient will decrease shortness of breath and feel confident in carrying out an exercise regimen at home. Through exercise at rehab and home, the patient will decrease shortness of breath and feel confident in carrying out an exercise regimen at home.         Nutrition & Weight - Outcomes:  Pre Biometrics - 10/06/23 0912       Pre Biometrics   Grip Strength 25 kg            Nutrition:   Nutrition Discharge:   Education Questionnaire Score:  Knowledge Questionnaire Score - 10/06/23 1017       Knowledge Questionnaire Score   Pre Score 17/18         Brandon Sanford decided to withdraw from the PR program. He stated he feels confidant exercising independently  at the gym. Brandon Sanford completed 5 sessions. Discharge psychosocial re-evaluation as follows: Brandon Sanford continued to deny any psy/soc barriers or concerns. PHQ 2/9 scores were 0/1 at orientation and Brandon Sanford denied an additional needs or resources.   Discharge review of patient's Core Components/Risk Factors/Patient Goals are as follows: Goal met for improving his shortness of breath with ADLs. Brandon Sanford worked hard building up his strength and stamina. He has been exercising for years before joining the program and he had very little shortness of breath with exertion. He was able to maintain his oxygen saturation on room air with exercise. Goal met for developing more efficient breathing techniques such as purse lipped breathing and diaphragmatic breathing; and practicing self-pacing with activity. Brandon Sanford could perform purse lipped breathing while short of breath. He demonstrated this while performing the warmup and exercising. He can initiate PLB on his own. He works on diaphragmatic breathing at home.

## 2023-10-29 NOTE — Telephone Encounter (Signed)
 Patient called stating he is going to start going to the gym with his wife and no longer wants to attend pulmonary rehab. Cancelled classes, removed from schedule.

## 2023-10-30 ENCOUNTER — Encounter (HOSPITAL_COMMUNITY)

## 2023-11-04 ENCOUNTER — Encounter (HOSPITAL_COMMUNITY)

## 2023-11-06 ENCOUNTER — Encounter (HOSPITAL_COMMUNITY)

## 2023-11-11 ENCOUNTER — Encounter (HOSPITAL_COMMUNITY)

## 2023-11-13 ENCOUNTER — Encounter (HOSPITAL_COMMUNITY)

## 2023-11-18 ENCOUNTER — Encounter (HOSPITAL_COMMUNITY)

## 2023-11-20 ENCOUNTER — Encounter (HOSPITAL_COMMUNITY)

## 2023-11-25 ENCOUNTER — Encounter (HOSPITAL_COMMUNITY)

## 2023-11-27 ENCOUNTER — Encounter (HOSPITAL_COMMUNITY)

## 2023-12-02 ENCOUNTER — Encounter (HOSPITAL_COMMUNITY)

## 2023-12-04 ENCOUNTER — Encounter (HOSPITAL_COMMUNITY)

## 2023-12-09 ENCOUNTER — Encounter (HOSPITAL_COMMUNITY)

## 2023-12-11 ENCOUNTER — Encounter (HOSPITAL_COMMUNITY)

## 2023-12-16 ENCOUNTER — Encounter (HOSPITAL_COMMUNITY)

## 2023-12-23 ENCOUNTER — Encounter (HOSPITAL_COMMUNITY)

## 2023-12-25 ENCOUNTER — Encounter (HOSPITAL_COMMUNITY)

## 2023-12-25 ENCOUNTER — Encounter (HOSPITAL_COMMUNITY): Payer: Self-pay | Admitting: General Surgery

## 2023-12-26 ENCOUNTER — Encounter (HOSPITAL_COMMUNITY): Payer: Self-pay | Admitting: General Surgery

## 2023-12-30 ENCOUNTER — Encounter (HOSPITAL_COMMUNITY)

## 2024-01-01 ENCOUNTER — Encounter (HOSPITAL_COMMUNITY)

## 2024-03-08 ENCOUNTER — Ambulatory Visit: Admitting: Adult Health
